# Patient Record
Sex: Female | Born: 1950 | Race: Black or African American | Hispanic: No | Marital: Single | Smoking: Current every day smoker
Health system: Southern US, Community
[De-identification: ages and names within clinical notes are randomized; demographics above are authoritative.]

## PROBLEM LIST (undated history)

## (undated) DIAGNOSIS — F32A Depression, unspecified: Secondary | ICD-10-CM

## (undated) DIAGNOSIS — R42 Dizziness and giddiness: Secondary | ICD-10-CM

## (undated) DIAGNOSIS — J45909 Unspecified asthma, uncomplicated: Secondary | ICD-10-CM

## (undated) DIAGNOSIS — I5189 Other ill-defined heart diseases: Secondary | ICD-10-CM

## (undated) DIAGNOSIS — I10 Essential (primary) hypertension: Secondary | ICD-10-CM

## (undated) HISTORY — DX: Dizziness and giddiness: R42

## (undated) HISTORY — DX: Depression, unspecified: F32.A

## (undated) HISTORY — DX: Unspecified asthma, uncomplicated: J45.909

---

## 2000-07-14 DIAGNOSIS — I10 Essential (primary) hypertension: Secondary | ICD-10-CM

## 2000-07-14 HISTORY — DX: Essential (primary) hypertension: I10

## 2010-07-14 DIAGNOSIS — M4800 Spinal stenosis, site unspecified: Secondary | ICD-10-CM

## 2010-07-14 HISTORY — DX: Spinal stenosis, site unspecified: M48.00

## 2011-07-15 HISTORY — PX: JOINT REPLACEMENT: SHX530

## 2017-07-14 HISTORY — PX: PACEMAKER PLACEMENT: SHX43

## 2020-03-23 ENCOUNTER — Emergency Department: Payer: Medicare (Managed Care)

## 2020-03-23 ENCOUNTER — Other Ambulatory Visit: Payer: Self-pay

## 2020-03-23 ENCOUNTER — Emergency Department
Admission: EM | Admit: 2020-03-23 | Discharge: 2020-03-23 | Disposition: A | Discharge status: Home / Self Care | Payer: Medicare (Managed Care) | Attending: Emergency Medicine | Admitting: Emergency Medicine

## 2020-03-23 DIAGNOSIS — M545 Low back pain: Secondary | ICD-10-CM | POA: Insufficient documentation

## 2020-03-23 DIAGNOSIS — I10 Essential (primary) hypertension: Secondary | ICD-10-CM | POA: Diagnosis not present

## 2020-03-23 DIAGNOSIS — M79604 Pain in right leg: Secondary | ICD-10-CM | POA: Diagnosis present

## 2020-03-23 HISTORY — DX: Essential (primary) hypertension: I10

## 2020-03-23 MED ORDER — NAPROXEN 500 MG PO TABS: 500.0000 mg | ORAL_TABLET | Freq: Two times a day (BID) | ORAL | 0 refills | Status: DC

## 2020-03-23 MED ORDER — TRAMADOL HCL 50 MG PO TABS: 50.0000 mg | ORAL_TABLET | Freq: Four times a day (QID) | ORAL | 0 refills | Status: DC | PRN

## 2020-03-23 MED ORDER — TRAMADOL HCL 50 MG PO TABS
50.0000 mg | ORAL_TABLET | Freq: Once | ORAL | Status: AC
  Administered 2020-03-23: 50 mg via ORAL
  Filled 2020-03-23: qty 1

## 2020-03-23 NOTE — ED Triage Notes (Signed)
Pt comes via POV from home with c/o pain to right leg. Pt states this started few weeks ago and has since gotten worse. Pt states she has arthritis.  Pt denies any recent injuries.

## 2020-03-23 NOTE — Discharge Instructions (Signed)
Please follow up with orthopedics for symptoms that are not improving over the next week.  Return to the ER for symptoms that change or worsen if unable to schedule an appointment.

## 2020-03-23 NOTE — ED Triage Notes (Addendum)
First nurse note- here for weakness, to right leg, feels like dragging it.  Sx X 2 weeks. Pt asking to go down to cafeteria, instructed to wait at this time as she has not even been triaged yet.

## 2020-03-23 NOTE — ED Provider Notes (Signed)
[facility] Emergency Department Provider Note ____________________________________________  Time seen: Approximately 10:22 AM  I have reviewed the triage vital signs and the nursing notes.   HISTORY  Chief Complaint Leg Pain    HPI Kari Hughes is a 69 y.o. female who presents to the emergency department for evaluation and treatment of for treatment and evaluation of nontraumatic right leg pain.  Patient states that symptoms started approximately 2 weeks ago.  She states that she has been able to ambulate but her right leg feels "different."  She states pain goes from her lower back into the hip and leg.  She has not fallen.  She moved to this area 3 days ago from [place].  No alleviating measures attempted prior to arrival.   Past Medical History:  Diagnosis Date  . Hypertension     There are no problems to display for this patient.   History reviewed. No pertinent surgical history.  Prior to Admission medications   Medication Sig Start Date End Date Taking? Authorizing Provider  naproxen (NAPROSYN) 500 MG tablet Take 1 tablet (500 mg total) by mouth 2 (two) times daily with a meal. 03/23/20   Sonam Huelsmann B, FNP  traMADol (ULTRAM) 50 MG tablet Take 1 tablet (50 mg total) by mouth every 6 (six) hours as needed. 03/23/20   [name], FNP    Allergies Patient has no allergy information on record.  No family history on file.  Social History Social History   Tobacco Use  . Smoking status: Not on file  Substance Use Topics  . Alcohol use: Not on file  . Drug use: Not on file    Review of Systems Constitutional: Negative for fever. Cardiovascular: Negative for chest pain. Respiratory: Negative for shortness of breath. Musculoskeletal: Positive for right leg pain and right low back pain. Skin: Negative for open wounds or lesions.  Neurological: Negative for decrease in  sensation  ____________________________________________   PHYSICAL EXAM:  VITAL SIGNS: ED Triage Vitals  Enc Vitals Group     BP 03/23/20 0938 (!) 168/100     Pulse Rate 03/23/20 0938 64     Resp 03/23/20 0938 17     Temp 03/23/20 0938 98 F (36.7 C)     Temp src --      SpO2 03/23/20 0938 100 %     Weight 03/23/20 0936 130 lb (59 kg)     Height 03/23/20 0936 5' (1.524 m)     Head Circumference --      Peak Flow --      Pain Score 03/23/20 0936 8     Pain Loc --      Pain Edu? --      Excl. in GC? --     Constitutional: Alert and oriented. Well appearing and in no acute distress. Eyes: Conjunctivae are clear without discharge or drainage Head: Atraumatic Neck: Supple  Respiratory: No cough. Respirations are even and unlabored. Musculoskeletal: Demonstrates FROM of right lower extremity. Gait is steady and coordinated without lag. Neurologic: Radicular pain from back to posterior knee.  Skin: No open wounds or lesions.   Psychiatric: Affect and behavior are appropriate.  ____________________________________________   LABS (all labs ordered are listed, but only abnormal results are displayed)  Labs Reviewed - No data to display ____________________________________________  RADIOLOGY  Images of the lumbar spine and femur hardened negative for acute findings.  There is degenerative disc disease and facet hypertrophy throughout the lumbar.  CT imaging of  the lower back shows severe right and moderately severe left foraminal narrowing at L5-S1 due to disc and right much worse than left facet degenerative disease.   DG Lumbar Spine 2-3 Views  Result Date: 03/23/2020 CLINICAL DATA:  Right leg weakness EXAM: LUMBAR SPINE - 2-3 VIEW COMPARISON:  None. FINDINGS: Grade 1 anterolisthesis at L5-S1. Vertebral body heights are maintained apart from mild degenerative endplate irregularity. There is diffuse disc space narrowing and vacuum disc phenomenon. Small posterior endplate  osteophytes are present. There is multilevel facet hypertrophy. IMPRESSION: Degenerative disc disease and facet hypertrophy throughout. Electronically Signed   By: Guadlupe Spanish M.D.   On: 03/23/2020 11:12   CT Lumbar Spine Wo Contrast  Result Date: 03/23/2020 CLINICAL DATA:  Low back pain. Right leg weakness. No known injury. EXAM: CT LUMBAR SPINE WITHOUT CONTRAST TECHNIQUE: Multidetector CT imaging of the lumbar spine was performed without intravenous contrast administration. Multiplanar CT image reconstructions were also generated. COMPARISON:  Plain films lumbar spine today. FINDINGS: Segmentation: Standard. Alignment: Facet degenerative disease results in 0.5 cm anterolisthesis L5 on S1. Vertebrae: No acute fracture or focal pathologic process. Degenerative endplate sclerosis is most notable at L3-4. Paraspinal and other soft tissues: Aortic atherosclerosis and sigmoid diverticulosis are seen. Disc levels: T11-12: Mild-to-moderate facet arthropathy and a minimal disc bulge. No stenosis. T12-L1: Shallow disc bulge and mild facet arthropathy.  No stenosis. L1-2: Vacuum disc phenomenon, mild loss of disc space height and a bulge eccentric to the left. The central canal and foramina are open. L2-3: Shallow disc bulge and vacuum disc phenomenon. Mild central canal and bilateral foraminal narrowing. L3-4: Vacuum disc phenomenon, disc bulge and ligamentum flavum thickening. Mild facet degenerative disease. There is mild to moderate central canal stenosis. Moderate to moderately severe foraminal narrowing is worse on the left. L4-5: Moderate facet arthropathy. Disc bulge and vacuum disc phenomenon. The central canal is open. Moderate to moderately severe foraminal narrowing is worse on the left. L5-S1: Severe facet degenerative change on the right and mild-to-moderate facet arthropathy on the left. The disc is uncovered with a shallow bulge. The central canal appears open. Disc and facet arthropathy cause severe  right and moderately severe left foraminal narrowing. IMPRESSION: No acute abnormality. Mild to moderate central canal stenosis at L3-4 where moderate to moderately severe bilateral foraminal narrowing is worse on the left. Moderate to moderately severe bilateral foraminal narrowing at L4-5 is worse on the left. The central canal is open. Severe right and moderately severe left foraminal narrowing at L5-S1 due to disc and right much worse than left facet degenerative disease. Diverticulosis. Aortic Atherosclerosis (ICD10-I70.0). Electronically Signed   By: Drusilla Kanner M.D.   On: 03/23/2020 12:15   DG Femur Min 2 Views Right  Result Date: 03/23/2020 CLINICAL DATA:  69 year old female with right leg weakness x2 weeks. No known injury. EXAM: RIGHT FEMUR 2 VIEWS COMPARISON:  None. FINDINGS: Bone mineralization is within normal limits. Right femoral head normally located. Visible right hemipelvis intact. Right SI joint appears normal. There is no evidence of fracture or other focal bone lesions. No acute osseous abnormality identified. There is some tricompartmental degenerative joint space loss at the right knee. There is some right femoral artery calcified peripheral vascular disease. IMPRESSION: No acute osseous abnormality identified. Right femoral artery calcified atherosclerosis. Electronically Signed   By: Odessa Fleming M.D.   On: 03/23/2020 11:11   ____________________________________________   PROCEDURES  Procedures  ____________________________________________   INITIAL IMPRESSION / ASSESSMENT AND PLAN / ED COURSE  Kari Hughes is a 69 y.o. who presents to the emergency department for treatment and evaluation of leg pain. See HPI.  Plan will be to get images and give medication.  X-rays show no concern for acute findings.  CT of the lumbar spine will be completed as the patient does tell me she has pacemaker therefore MRI cannot be performed.  While here, she was given Tramadol with  some relief. She will be prescribed tramadol and naprosyn and advised to follow up with orthopedics. She was also advised to establish care with a primary care provider.   Patient observed ambulating out while using her Rollator with a steady gait.  Medications  traMADol (ULTRAM) tablet 50 mg (50 mg Oral Given 03/23/20 1111)    Pertinent labs & imaging results that were available during my care of the patient were reviewed by me and considered in my medical decision making (see chart for details).   _________________________________________   FINAL CLINICAL IMPRESSION(S) / ED DIAGNOSES  Final diagnoses:  Leg pain, diffuse, right    ED Discharge Orders         Ordered    traMADol (ULTRAM) 50 MG tablet  Every 6 hours PRN        03/23/20 1305    naproxen (NAPROSYN) 500 MG tablet  2 times daily with meals        03/23/20 1305           If controlled substance prescribed during this visit, 12 month history viewed on the [facility] prior to issuing an initial prescription for Schedule II or III opiod.   [name], FNP 03/23/20 1326    Gilles Chiquito, MD 03/23/20 1335

## 2020-03-27 ENCOUNTER — Emergency Department
Admission: EM | Admit: 2020-03-27 | Discharge: 2020-03-27 | Disposition: A | Discharge status: Home / Self Care | Payer: Medicare (Managed Care) | Attending: Emergency Medicine | Admitting: Emergency Medicine

## 2020-03-27 ENCOUNTER — Other Ambulatory Visit: Payer: Self-pay

## 2020-03-27 DIAGNOSIS — M79606 Pain in leg, unspecified: Secondary | ICD-10-CM | POA: Diagnosis present

## 2020-03-27 DIAGNOSIS — Z950 Presence of cardiac pacemaker: Secondary | ICD-10-CM | POA: Insufficient documentation

## 2020-03-27 DIAGNOSIS — M479 Spondylosis, unspecified: Secondary | ICD-10-CM | POA: Insufficient documentation

## 2020-03-27 DIAGNOSIS — I10 Essential (primary) hypertension: Secondary | ICD-10-CM | POA: Diagnosis not present

## 2020-03-27 DIAGNOSIS — M158 Other polyosteoarthritis: Secondary | ICD-10-CM

## 2020-03-27 DIAGNOSIS — Z96651 Presence of right artificial knee joint: Secondary | ICD-10-CM | POA: Insufficient documentation

## 2020-03-27 MED ORDER — LIDOCAINE 5 % EX PTCH: 1.0000 | MEDICATED_PATCH | Freq: Two times a day (BID) | CUTANEOUS | 0 refills | Status: DC

## 2020-03-27 MED ORDER — LIDOCAINE 5 % EX PTCH
1.0000 | MEDICATED_PATCH | CUTANEOUS | Status: DC
  Administered 2020-03-27: 1 via TRANSDERMAL
  Filled 2020-03-27: qty 1

## 2020-03-27 NOTE — Discharge Instructions (Addendum)
Patient advised to follow-up with orthopedics listed on your discharge care instruction.  Continue previous medication and apply Lidoderm patches as directed.

## 2020-03-27 NOTE — ED Provider Notes (Signed)
[facility] Provider Note   ____________________________________________   First MD Initiated Contact with Patient 03/27/20 1239     (approximate)  I have reviewed the triage vital signs and the nursing notes.   HISTORY  Chief Complaint Leg Pain    HPI Kari Hughes is a 69 y.o. female patient presents with continue back and right leg pain.  Patient seen facility 2 days ago had extensive work-up to consist x-rays and CT scans.  Patient was advised to follow orthopedic for the definitive evaluation and treatment.  Patient states she has not contacted orthopedic department.  Patient states no relief with tramadol and naproxen.         Past Medical History:  Diagnosis Date  . Hypertension     There are no problems to display for this patient.   Past Surgical History:  Procedure Laterality Date  . JOINT REPLACEMENT Left 2013  . PACEMAKER PLACEMENT  2019    Prior to Admission medications   Medication Sig Start Date End Date Taking? Authorizing Provider  lidocaine (LIDODERM) 5 % Place 1 patch onto the skin every 12 (twelve) hours. Remove & Discard patch within 12 hours or as directed by MD 03/27/20 03/27/21  Joni Reining, PA-C  naproxen (NAPROSYN) 500 MG tablet Take 1 tablet (500 mg total) by mouth 2 (two) times daily with a meal. 03/23/20   Triplett, Cari B, FNP  traMADol (ULTRAM) 50 MG tablet Take 1 tablet (50 mg total) by mouth every 6 (six) hours as needed. 03/23/20   [name], FNP    Allergies Patient has no allergy information on record.  History reviewed. No pertinent family history.  Social History Social History   Tobacco Use  . Smoking status: Never Smoker  . Smokeless tobacco: Never Used  Substance Use Topics  . Alcohol use: Not Currently  . Drug use: Not Currently    Review of Systems Constitutional: No fever/chills Eyes: No visual changes. ENT: No sore throat. Cardiovascular: Denies chest  pain. Respiratory: Denies shortness of breath. Gastrointestinal: No abdominal pain.  No nausea, no vomiting.  No diarrhea.  No constipation. Genitourinary: Negative for dysuria. Musculoskeletal: Low back and right hip pain. Skin: Negative for rash. Neurological: Negative for headaches, focal weakness or numbness. Endocrine:  Hypertension  ____________________________________________   PHYSICAL EXAM:  VITAL SIGNS: ED Triage Vitals  Enc Vitals Group     BP 03/27/20 1212 (!) 131/102     Pulse Rate 03/27/20 1212 75     Resp 03/27/20 1212 16     Temp 03/27/20 1212 98.2 F (36.8 C)     Temp src --      SpO2 03/27/20 1212 95 %     Weight 03/27/20 1213 130 lb 1.1 oz (59 kg)     Height 03/27/20 1213 5\' 2"  (1.575 m)     Head Circumference --      Peak Flow --      Pain Score 03/27/20 1213 10     Pain Loc --      Pain Edu? --      Excl. in GC? --    Constitutional: Alert and oriented. Well appearing and in no acute distress.  No cervical lymphadenopathy. Cardiovascular: Normal rate, regular rhythm. Grossly normal heart sounds.  Good peripheral circulation. Respiratory: Normal respiratory effort.  No retractions. Lungs CTAB. Genitourinary: Deferred Musculoskeletal: No obvious lumbar spine deformity.  Patient has decreased range of motion for flexion.  No obvious deformity to  the right lower extremity.  Patient refused to ambulate without support of walker. Neurologic:  Normal speech and language. No gross focal neurologic deficits are appreciated. No gait instability. Skin:  Skin is warm, dry and intact. No rash noted. Psychiatric: Mood and affect are normal. Speech and behavior are normal.  ____________________________________________   LABS (all labs ordered are listed, but only abnormal results are displayed)  Labs Reviewed - No data to display ____________________________________________  EKG   ____________________________________________  RADIOLOGY  ED MD  interpretation: Reviewed images taken 2 days ago.  Official radiology report(s): No results found.  ____________________________________________   PROCEDURES  Procedure(s) performed (including Critical Care):  Procedures   ____________________________________________   INITIAL IMPRESSION / ASSESSMENT AND PLAN / ED COURSE  As part of my medical decision making, I reviewed the following data within the electronic MEDICAL RECORD NUMBER     Patient presents for continued back and right lower extremity pain.  Discussed rationale for consultant of orthopedic for definitive evaluation and treatment.  Advised to continue previous medication and given a prescription for Lidoderm patches.          ____________________________________________   FINAL CLINICAL IMPRESSION(S) / ED DIAGNOSES  Final diagnoses:  Other osteoarthritis involving multiple joints     ED Discharge Orders         Ordered    lidocaine (LIDODERM) 5 %  Every 12 hours        03/27/20 1252          *Please note:  Kari Hughes was evaluated in Emergency Department on 03/27/2020 for the symptoms described in the history of present illness. She was evaluated in the context of the global COVID-19 pandemic, which necessitated consideration that the patient might be at risk for infection with the SARS-CoV-2 virus that causes COVID-19. Institutional protocols and algorithms that pertain to the evaluation of patients at risk for COVID-19 are in a state of rapid change based on information released by regulatory bodies including the CDC and federal and state organizations. These policies and algorithms were followed during the patient's care in the ED.  Some ED evaluations and interventions may be delayed as a result of limited staffing during and the pandemic.*   Note:  This document was prepared using Dragon voice recognition software and may include unintentional dictation errors.    Joni Reining, PA-C 03/27/20  1301    Merwyn Katos, MD 03/27/20 [phone]

## 2020-03-27 NOTE — ED Triage Notes (Signed)
Pt arrives via pov with c.o right leg pain. Pt reports being seen here 2 days ago for same and was told it was degenerative disks. Pt states pain is same as before. NAD noted.

## 2020-03-30 DIAGNOSIS — M5416 Radiculopathy, lumbar region: Secondary | ICD-10-CM | POA: Insufficient documentation

## 2020-05-03 ENCOUNTER — Encounter: Payer: Self-pay | Admitting: Physician Assistant

## 2020-05-03 ENCOUNTER — Emergency Department
Admission: EM | Admit: 2020-05-03 | Discharge: 2020-05-03 | Disposition: A | Discharge status: Home / Self Care | Payer: Medicare Other | Attending: Emergency Medicine | Admitting: Emergency Medicine

## 2020-05-03 ENCOUNTER — Other Ambulatory Visit: Payer: Self-pay

## 2020-05-03 DIAGNOSIS — M5442 Lumbago with sciatica, left side: Secondary | ICD-10-CM | POA: Insufficient documentation

## 2020-05-03 DIAGNOSIS — I10 Essential (primary) hypertension: Secondary | ICD-10-CM | POA: Diagnosis not present

## 2020-05-03 DIAGNOSIS — Z9660 Presence of unspecified orthopedic joint implant: Secondary | ICD-10-CM | POA: Insufficient documentation

## 2020-05-03 DIAGNOSIS — Z950 Presence of cardiac pacemaker: Secondary | ICD-10-CM | POA: Diagnosis not present

## 2020-05-03 DIAGNOSIS — G8929 Other chronic pain: Secondary | ICD-10-CM

## 2020-05-03 DIAGNOSIS — M545 Low back pain, unspecified: Secondary | ICD-10-CM | POA: Diagnosis present

## 2020-05-03 DIAGNOSIS — M5441 Lumbago with sciatica, right side: Secondary | ICD-10-CM | POA: Insufficient documentation

## 2020-05-03 DIAGNOSIS — M542 Cervicalgia: Secondary | ICD-10-CM | POA: Diagnosis not present

## 2020-05-03 MED ORDER — HYDROMORPHONE HCL 1 MG/ML IJ SOLN
1.0000 mg | Freq: Once | INTRAMUSCULAR | Status: AC
  Administered 2020-05-03: 1 mg via INTRAMUSCULAR
  Filled 2020-05-03: qty 1

## 2020-05-03 MED ORDER — HYDROCODONE-ACETAMINOPHEN 5-325 MG PO TABS
1.0000 | ORAL_TABLET | Freq: Three times a day (TID) | ORAL | 0 refills | Status: DC | PRN
Start: 2020-05-03 — End: 2020-07-01

## 2020-05-03 MED ORDER — PREDNISONE 20 MG PO TABS: 20.0000 mg | ORAL_TABLET | Freq: Two times a day (BID) | ORAL | 0 refills | Status: DC

## 2020-05-03 MED ORDER — BACLOFEN 10 MG PO TABS: 10.0000 mg | ORAL_TABLET | Freq: Three times a day (TID) | ORAL | 1 refills | Status: DC | PRN

## 2020-05-03 MED ORDER — PREDNISONE 20 MG PO TABS
60.0000 mg | ORAL_TABLET | Freq: Once | ORAL | Status: AC
  Administered 2020-05-03: 60 mg via ORAL
  Filled 2020-05-03: qty 3

## 2020-05-03 NOTE — Discharge Instructions (Addendum)
You are being treated for your acute on chronic low back pain secondary to underlying DDD, facet arthritis, and radicular pain.  You should take the medications as prescribed.  Follow-up with your primary provider for ongoing symptoms.  Return to the ED if needed.

## 2020-05-03 NOTE — ED Notes (Signed)
Patient states she can't get any rest due to the chronic pain. Patient ambulated to the exam room from the lobby with the Rollator. Patient had a steady, slow, halting gait. Patient is tearful.

## 2020-05-03 NOTE — ED Triage Notes (Signed)
Pt in with co chronic neck, and back pain that radiates to right leg. Pt states she has been going to ortho but ran out of pain meds. Pt here for uncontrolled pain, did see ortho today but was not given a refill for vidocin.

## 2020-05-03 NOTE — ED Provider Notes (Signed)
[facility] Emergency Department Provider Note ____________________________________________  Time seen: 2154  I have reviewed the triage vital signs and the nursing notes.  HISTORY  Chief Complaint  Back Pain   HPI Kari Hughes is a 69 y.o. female presents to the ED for evaluation of acute on chronic low back pain.   With a history of chronic neck and back pain reports pain that radiates into the right leg.  She has been evaluated and followed by Ortho for her symptoms, but reports that she apparently ran out of her pain medications.  She is here for unknown control pain related to her underlying DDD and facet arthropathy.  She apparently had a visit with Ortho today, but was not given a refill on her Vicodin pain medicine.  Patient denies any recent injury, fall, trauma.  Also denies any bladder or bowel incontinence, foot drop, or saddle anesthesia.  Past Medical History:  Diagnosis Date  . Hypertension     There are no problems to display for this patient.   Past Surgical History:  Procedure Laterality Date  . JOINT REPLACEMENT Left 2013  . PACEMAKER PLACEMENT  2019    Prior to Admission medications   Medication Sig Start Date End Date Taking? Authorizing Provider  baclofen (LIORESAL) 10 MG tablet Take 1 tablet (10 mg total) by mouth 3 (three) times daily as needed for muscle spasms. 05/03/20 06/02/20  Devarious Pavek, Charlesetta Ivory, PA-C  HYDROcodone-acetaminophen (NORCO) 5-325 MG tablet Take 1 tablet by mouth 3 (three) times daily as needed. 05/03/20   Krimson Massmann, Charlesetta Ivory, PA-C  predniSONE (DELTASONE) 20 MG tablet Take 1 tablet (20 mg total) by mouth 2 (two) times daily with a meal for 5 days. 05/03/20 05/08/20  Windle Huebert, Charlesetta Ivory, PA-C    Allergies Patient has no allergy information on record.  History reviewed. No pertinent family history.  Social History Social History   Tobacco Use  . Smoking status: Never Smoker  . Smokeless tobacco:  Never Used  Substance Use Topics  . Alcohol use: Not Currently  . Drug use: Not Currently    Review of Systems  Constitutional: Negative for fever. Eyes: Negative for visual changes. ENT: Negative for sore throat. Cardiovascular: Negative for chest pain. Respiratory: Negative for shortness of breath. Gastrointestinal: Negative for abdominal pain, vomiting and diarrhea. Genitourinary: Negative for dysuria. Musculoskeletal: Positive for back pain. Skin: Negative for rash. Neurological: Negative for headaches, focal weakness or numbness. ____________________________________________  PHYSICAL EXAM:  VITAL SIGNS: ED Triage Vitals  Enc Vitals Group     BP 05/03/20 2104 (!) 187/106     Pulse Rate 05/03/20 2104 83     Resp 05/03/20 2104 18     Temp 05/03/20 2104 98.5 F (36.9 C)     Temp Source 05/03/20 2104 Oral     SpO2 05/03/20 2104 100 %     Weight 05/03/20 2105 135 lb (61.2 kg)     Height 05/03/20 2105 5\' 2"  (1.575 m)     Head Circumference --      Peak Flow --      Pain Score 05/03/20 2104 10     Pain Loc --      Pain Edu? --      Excl. in GC? --     Constitutional: Alert and oriented. Well appearing and in no distress. Head: Normocephalic and atraumatic. Eyes: Conjunctivae are normal. Normal extraocular movements Cardiovascular: Normal rate, regular rhythm. Normal distal pulses. Respiratory: Normal respiratory effort. No  wheezes/rales/rhonchi. Gastrointestinal: Soft and nontender. No distention. Musculoskeletal: Normal spinal alignment without midline tenderness, spasm, vomiting, or step-off.  Nontender with normal range of motion in all extremities.  Neurologic: Mildly antalgic gait assisted by a rolling walker.  Without ataxia. Normal speech and language. No gross focal neurologic deficits are appreciated. Skin:  Skin is warm, dry and intact. No rash noted. Psychiatric: Mood and affect are normal. Patient exhibits appropriate insight and  judgment. ____________________________________________  PROCEDURES  Dilaudid 1 mg IM Prednisone 60 mg PO  Procedures ____________________________________________  INITIAL IMPRESSION / ASSESSMENT AND PLAN / ED COURSE  Patient with a history of chronic back pain secondary to facet arthropathy, degenerative disc disease, and radiculopathy, presents for acute on chronic pain.  She is in the process of being transition to pain management and cleared by cardiology for injections and subsequent surgery.  She denies any interim injury.  Exam is overall benign reassuring at this time.  No red flags on exam.  She will be discharged with prescriptions for prednisone, hydrocodone, and baclofen.  She will follow-up with her Ortho provider for ongoing symptoms or return to the ED if needed.  Kari Hughes was evaluated in Emergency Department on 05/03/2020 for the symptoms described in the history of present illness. She was evaluated in the context of the global COVID-19 pandemic, which necessitated consideration that the patient might be at risk for infection with the SARS-CoV-2 virus that causes COVID-19. Institutional protocols and algorithms that pertain to the evaluation of patients at risk for COVID-19 are in a state of rapid change based on information released by regulatory bodies including the CDC and federal and state organizations. These policies and algorithms were followed during the patient's care in the ED.  I reviewed the patient's prescription history over the last 12 months in the multi-state controlled substances database(s) that includes [place], [place], [place], [place], [place], [place], [place], [place], [place], [place], [place], [place], [place], and [place].  Results were notable for no current RX. ____________________________________________  FINAL CLINICAL IMPRESSION(S) / ED DIAGNOSES  Final diagnoses:  Chronic bilateral low back pain  with bilateral sciatica      Karmen Stabs, Charlesetta Ivory, PA-C 05/03/20 2227    Gilles Chiquito, MD 05/04/20 [phone]

## 2020-05-03 NOTE — ED Notes (Signed)
Patient is calling for a ride

## 2020-05-08 ENCOUNTER — Other Ambulatory Visit: Payer: Self-pay

## 2020-05-08 ENCOUNTER — Ambulatory Visit (INDEPENDENT_AMBULATORY_CARE_PROVIDER_SITE_OTHER): Payer: Medicare Other | Admitting: Adult Health

## 2020-05-08 ENCOUNTER — Encounter: Payer: Self-pay | Admitting: Adult Health

## 2020-05-08 VITALS — BP 157/89 | HR 70 | Temp 97.9°F | Resp 16 | Wt 142.8 lb

## 2020-05-08 DIAGNOSIS — Z6826 Body mass index (BMI) 26.0-26.9, adult: Secondary | ICD-10-CM

## 2020-05-08 DIAGNOSIS — M2550 Pain in unspecified joint: Secondary | ICD-10-CM | POA: Diagnosis not present

## 2020-05-08 DIAGNOSIS — Z950 Presence of cardiac pacemaker: Secondary | ICD-10-CM | POA: Diagnosis not present

## 2020-05-08 DIAGNOSIS — H539 Unspecified visual disturbance: Secondary | ICD-10-CM

## 2020-05-08 DIAGNOSIS — G8929 Other chronic pain: Secondary | ICD-10-CM

## 2020-05-08 DIAGNOSIS — M5442 Lumbago with sciatica, left side: Secondary | ICD-10-CM | POA: Diagnosis not present

## 2020-05-08 DIAGNOSIS — I10 Essential (primary) hypertension: Secondary | ICD-10-CM | POA: Diagnosis not present

## 2020-05-08 DIAGNOSIS — M5441 Lumbago with sciatica, right side: Secondary | ICD-10-CM

## 2020-05-08 MED ORDER — AMLODIPINE BESYLATE 5 MG PO TABS: 5.0000 mg | ORAL_TABLET | Freq: Every day | ORAL | 0 refills | Status: DC

## 2020-05-08 NOTE — Progress Notes (Deleted)
New patient visit   Patient: Kari Hughes   DOB: Dec 15, 1950   69 y.o. Female  MRN: [id number] Visit Date: 05/08/2020  Today's healthcare provider: Jairo Ben, FNP   Chief Complaint  Patient presents with   New Patient (Initial Visit)   Subjective    Kari Hughes is a 69 y.o. female who presents today as a new patient to establish care.  HPI  Patient states that she feels poorly today and would like to address chronic joint pain. Patient states that she has move to [facility] from [place] and was looking to establish. Patient is following a balanced diet, she is not actively exercing and states that sleep is fairly poor.   Past Medical History:  Diagnosis Date   Hypertension    Past Surgical History:  Procedure Laterality Date   JOINT REPLACEMENT Left 2013   PACEMAKER PLACEMENT  2019   No family status information on file.   No family history on file. Social History   Socioeconomic History   Marital status: Single    Spouse name: Not on file   Number of children: Not on file   Years of education: Not on file   Highest education level: Not on file  Occupational History   Not on file  Tobacco Use   Smoking status: Never Smoker   Smokeless tobacco: Never Used  Substance and Sexual Activity   Alcohol use: Not Currently   Drug use: Not Currently   Sexual activity: Not on file  Other Topics Concern   Not on file  Social History Narrative   Not on file   Social Determinants of Health   Financial Resource Strain:    Difficulty of Paying Living Expenses: Not on file  Food Insecurity:    Worried About Running Out of Food in the Last Year: Not on file   Ran Out of Food in the Last Year: Not on file  Transportation Needs:    Lack of Transportation (Medical): Not on file   Lack of Transportation (Non-Medical): Not on file  Physical Activity:    Days of Exercise per Week: Not on file   Minutes of Exercise per Session: Not on  file  Stress:    Feeling of Stress : Not on file  Social Connections:    Frequency of Communication with Friends and Family: Not on file   Frequency of Social Gatherings with Friends and Family: Not on file   Attends Religious Services: Not on file   Active Member of Clubs or Organizations: Not on file   Attends Banker Meetings: Not on file   Marital Status: Not on file   Outpatient Medications Prior to Visit  Medication Sig   baclofen (LIORESAL) 10 MG tablet Take 1 tablet (10 mg total) by mouth 3 (three) times daily as needed for muscle spasms.   HYDROcodone-acetaminophen (NORCO) 5-325 MG tablet Take 1 tablet by mouth 3 (three) times daily as needed.   predniSONE (DELTASONE) 20 MG tablet Take 1 tablet (20 mg total) by mouth 2 (two) times daily with a meal for 5 days.   No facility-administered medications prior to visit.   No Known Allergies   There is no immunization history on file for this patient.  Health Maintenance  Topic Date Due   Hepatitis C Screening  Never done   COVID-19 Vaccine (1) Never done   TETANUS/TDAP  Never done   MAMMOGRAM  Never done   COLONOSCOPY  Never done   DEXA  SCAN  Never done   PNA vac Low Risk Adult (1 of 2 - PCV13) Never done   INFLUENZA VACCINE  Never done    Patient Care Team: Patient, No Pcp Per as PCP - General (General Practice)  Review of Systems  Constitutional: Positive for appetite change, chills and diaphoresis.  Cardiovascular: Positive for leg swelling.  Musculoskeletal: Positive for arthralgias and myalgias.  All other systems reviewed and are negative.   {Heme   Chem   Endocrine   Serology   Results Review (optional):[id number]::" "}  Objective    There were no vitals taken for this visit. Physical Exam ***  Depression Screen No flowsheet data found. No results found for any visits on 05/08/20.  Assessment & Plan     ***  No follow-ups on file.     {provider  attestation***:1}   Jairo Ben, FNP  [facility] [phone] (phone) [phone] (fax)  [facility]

## 2020-05-08 NOTE — Patient Instructions (Addendum)
Amlodipine Oral Tablets What is this medicine? AMLODIPINE (am LOE di peen) is a calcium channel blocker. It relaxes your blood vessels and decreases the amount of work the heart has to do. It treats high blood pressure and/or prevents chest pain (also called angina). This medicine may be used for other purposes; ask your health care provider or pharmacist if you have questions. COMMON BRAND NAME(S): Norvasc What should I tell my health care provider before I take this medicine? They need to know if you have any of these conditions:  heart disease  liver disease  an unusual or allergic reaction to amlodipine, other drugs, foods, dyes, or preservatives  pregnant or trying to get pregnant  breast-feeding How should I use this medicine? Take this drug by mouth. Take it as directed on the prescription label at the same time every day. You can take it with or without food. If it upsets your stomach, take it with food. Keep taking it unless your health care provider tells you to stop. Talk to your health care provider about the use of this drug in children. While it may be prescribed for children as young as 6 for selected conditions, precautions do apply. Overdosage: If you think you have taken too much of this medicine contact a poison control center or emergency room at once. NOTE: This medicine is only for you. Do not share this medicine with others. What if I miss a dose? If you miss a dose, take it as soon as you can. If it is almost time for your next dose, take only that dose. Do not take double or extra doses. What may interact with this medicine? This medicine may interact with the following medications:  clarithromycin  cyclosporine  diltiazem  itraconazole  simvastatin  tacrolimus This list may not describe all possible interactions. Give your health care provider a list of all the medicines, herbs, non-prescription drugs, or dietary supplements you use. Also tell them if  you smoke, drink alcohol, or use illegal drugs. Some items may interact with your medicine. What should I watch for while using this medicine? Visit your health care provider for regular checks on your progress. Check your blood pressure as directed. Ask your health care provider what your blood pressure should be. Also, find out when you should contact him or her. Do not treat yourself for coughs, colds, or pain while you are using this drug without asking your health care provider for advice. Some drugs may increase your blood pressure. You may get drowsy or dizzy. Do not drive, use machinery, or do anything that needs mental alertness until you know how this drug affects you. Do not stand up or sit up quickly, especially if you are an older patient. This reduces the risk of dizzy or fainting spells. What side effects may I notice from receiving this medicine? Side effects that you should report to your doctor or health care provider as soon as possible:  allergic reactions (skin rash, itching or hives; swelling of the face, lips, or tongue)  heart attack (trouble breathing; pain or tightness in the chest, neck, back or arms; unusually weak or tired)  low blood pressure (dizziness; feeling faint or lightheaded, falls; unusually weak or tired) Side effects that usually do not require medical attention (report these to your doctor or health care provider if they continue or are bothersome):  facial flushing  nausea  palpitations  stomach pain  sudden weight gain  swelling of the ankles, feet, hands  This list may not describe all possible side effects. Call your doctor for medical advice about side effects. You may report side effects to FDA at 1-800-FDA-1088. Where should I keep my medicine? Keep out of the reach of children and pets. Store at room temperature between 59 and 86 degrees F (15 and 30 degrees C). Protect from light and moisture. Keep the container tightly closed. Throw away  any unused drug after the expiration date. NOTE: This sheet is a summary. It may not cover all possible information. If you have questions about this medicine, talk to your doctor, pharmacist, or health care provider.  2020 Elsevier/Gold Standard (2019-04-05 19:39:45) Health Maintenance, Female Adopting a healthy lifestyle and getting preventive care are important in promoting health and wellness. Ask your health care provider about:  The right schedule for you to have regular tests and exams.  Things you can do on your own to prevent diseases and keep yourself healthy. What should I know about diet, weight, and exercise? Eat a healthy diet   Eat a diet that includes plenty of vegetables, fruits, low-fat dairy products, and lean protein.  Do not eat a lot of foods that are high in solid fats, added sugars, or sodium. Maintain a healthy weight Body mass index (BMI) is used to identify weight problems. It estimates body fat based on height and weight. Your health care provider can help determine your BMI and help you achieve or maintain a healthy weight. Get regular exercise Get regular exercise. This is one of the most important things you can do for your health. Most adults should:  Exercise for at least 150 minutes each week. The exercise should increase your heart rate and make you sweat (moderate-intensity exercise).  Do strengthening exercises at least twice a week. This is in addition to the moderate-intensity exercise.  Spend less time sitting. Even light physical activity can be beneficial. Watch cholesterol and blood lipids Have your blood tested for lipids and cholesterol at 69 years of age, then have this test every 5 years. Have your cholesterol levels checked more often if:  Your lipid or cholesterol levels are high.  You are older than 69 years of age.  You are at high risk for heart disease. What should I know about cancer screening? Depending on your health history  and family history, you may need to have cancer screening at various ages. This may include screening for:  Breast cancer.  Cervical cancer.  Colorectal cancer.  Skin cancer.  Lung cancer. What should I know about heart disease, diabetes, and high blood pressure? Blood pressure and heart disease  High blood pressure causes heart disease and increases the risk of stroke. This is more likely to develop in people who have high blood pressure readings, are of African descent, or are overweight.  Have your blood pressure checked: ? Every 3-5 years if you are 40-35 years of age. ? Every year if you are 44 years old or older. Diabetes Have regular diabetes screenings. This checks your fasting blood sugar level. Have the screening done:  Once every three years after age 62 if you are at a normal weight and have a low risk for diabetes.  More often and at a younger age if you are overweight or have a high risk for diabetes. What should I know about preventing infection? Hepatitis B If you have a higher risk for hepatitis B, you should be screened for this virus. Talk with your health care provider to find  out if you are at risk for hepatitis B infection. Hepatitis C Testing is recommended for:  Everyone born from [birth year] through 1965.  Anyone with known risk factors for hepatitis C. Sexually transmitted infections (STIs)  Get screened for STIs, including gonorrhea and chlamydia, if: ? You are sexually active and are younger than 69 years of age. ? You are older than 69 years of age and your health care provider tells you that you are at risk for this type of infection. ? Your sexual activity has changed since you were last screened, and you are at increased risk for chlamydia or gonorrhea. Ask your health care provider if you are at risk.  Ask your health care provider about whether you are at high risk for HIV. Your health care provider may recommend a prescription medicine to help  prevent HIV infection. If you choose to take medicine to prevent HIV, you should first get tested for HIV. You should then be tested every 3 months for as long as you are taking the medicine. Pregnancy  If you are about to stop having your period (premenopausal) and you may become pregnant, seek counseling before you get pregnant.  Take 400 to 800 micrograms (mcg) of folic acid every day if you become pregnant.  Ask for birth control (contraception) if you want to prevent pregnancy. Osteoporosis and menopause Osteoporosis is a disease in which the bones lose minerals and strength with aging. This can result in bone fractures. If you are 36 years old or older, or if you are at risk for osteoporosis and fractures, ask your health care provider if you should:  Be screened for bone loss.  Take a calcium or vitamin D supplement to lower your risk of fractures.  Be given hormone replacement therapy (HRT) to treat symptoms of menopause. Follow these instructions at home: Lifestyle  Do not use any products that contain nicotine or tobacco, such as cigarettes, e-cigarettes, and chewing tobacco. If you need help quitting, ask your health care provider.  Do not use street drugs.  Do not share needles.  Ask your health care provider for help if you need support or information about quitting drugs. Alcohol use  Do not drink alcohol if: ? Your health care provider tells you not to drink. ? You are pregnant, may be pregnant, or are planning to become pregnant.  If you drink alcohol: ? Limit how much you use to 0-1 drink a day. ? Limit intake if you are breastfeeding.  Be aware of how much alcohol is in your drink. In the [place] one drink equals one 12 oz bottle of beer (355 mL), one 5 oz glass of wine (148 mL), or one 1 oz glass of hard liquor (44 mL). General instructions  Schedule regular health, dental, and eye exams.  Stay current with your vaccines.  Tell your health care provider  if: ? You often feel depressed. ? You have ever been abused or do not feel safe at home. Summary  Adopting a healthy lifestyle and getting preventive care are important in promoting health and wellness.  Follow your health care provider's instructions about healthy diet, exercising, and getting tested or screened for diseases.  Follow your health care provider's instructions on monitoring your cholesterol and blood pressure. This information is not intended to replace advice given to you by your health care provider. Make sure you discuss any questions you have with your health care provider. Document Revised: 06/23/2018 Document Reviewed: 06/23/2018 Elsevier Patient Education  2020 [employer]   Calorie Counting for Weight Loss Calories are units of energy. Your body needs a certain amount of calories from food to keep you going throughout the day. When you eat more calories than your body needs, your body stores the extra calories as fat. When you eat fewer calories than your body needs, your body burns fat to get the energy it needs. Calorie counting means keeping track of how many calories you eat and drink each day. Calorie counting can be helpful if you need to lose weight. If you make sure to eat fewer calories than your body needs, you should lose weight. Ask your health care provider what a healthy weight is for you. For calorie counting to work, you will need to eat the right number of calories in a day in order to lose a healthy amount of weight per week. A dietitian can help you determine how many calories you need in a day and will give you suggestions on how to reach your calorie goal.  A healthy amount of weight to lose per week is usually 1-2 lb (0.5-0.9 kg). This usually means that your daily calorie intake should be reduced by 500-750 calories.  Eating 1,200 - 1,500 calories per day can help most women lose weight.  Eating 1,500 - 1,800 calories per day can help most men  lose weight. What is my plan? My goal is to have __________ calories per day. If I have this many calories per day, I should lose around __________ pounds per week. What do I need to know about calorie counting? In order to meet your daily calorie goal, you will need to:  Find out how many calories are in each food you would like to eat. Try to do this before you eat.  Decide how much of the food you plan to eat.  Write down what you ate and how many calories it had. Doing this is called keeping a food log. To successfully lose weight, it is important to balance calorie counting with a healthy lifestyle that includes regular activity. Aim for 150 minutes of moderate exercise (such as walking) or 75 minutes of vigorous exercise (such as running) each week. Where do I find calorie information?  The number of calories in a food can be found on a Nutrition Facts label. If a food does not have a Nutrition Facts label, try to look up the calories online or ask your dietitian for help. Remember that calories are listed per serving. If you choose to have more than one serving of a food, you will have to multiply the calories per serving by the amount of servings you plan to eat. For example, the label on a package of bread might say that a serving size is 1 slice and that there are 90 calories in a serving. If you eat 1 slice, you will have eaten 90 calories. If you eat 2 slices, you will have eaten 180 calories. How do I keep a food log? Immediately after each meal, record the following information in your food log:  What you ate. Don't forget to include toppings, sauces, and other extras on the food.  How much you ate. This can be measured in cups, ounces, or number of items.  How many calories each food and drink had.  The total number of calories in the meal. Keep your food log near you, such as in a small notebook in your pocket, or use a mobile app or  website. Some programs will calculate  calories for you and show you how many calories you have left for the day to meet your goal. What are some calorie counting tips?   Use your calories on foods and drinks that will fill you up and not leave you hungry: ? Some examples of foods that fill you up are nuts and nut butters, vegetables, lean proteins, and high-fiber foods like whole grains. High-fiber foods are foods with more than 5 g fiber per serving. ? Drinks such as sodas, specialty coffee drinks, alcohol, and juices have a lot of calories, yet do not fill you up.  Eat nutritious foods and avoid empty calories. Empty calories are calories you get from foods or beverages that do not have many vitamins or protein, such as candy, sweets, and soda. It is better to have a nutritious high-calorie food (such as an avocado) than a food with few nutrients (such as a bag of chips).  Know how many calories are in the foods you eat most often. This will help you calculate calorie counts faster.  Pay attention to calories in drinks. Low-calorie drinks include water and unsweetened drinks.  Pay attention to nutrition labels for "low fat" or "fat free" foods. These foods sometimes have the same amount of calories or more calories than the full fat versions. They also often have added sugar, starch, or salt, to make up for flavor that was removed with the fat.  Find a way of tracking calories that works for you. Get creative. Try different apps or programs if writing down calories does not work for you. What are some portion control tips?  Know how many calories are in a serving. This will help you know how many servings of a certain food you can have.  Use a measuring cup to measure serving sizes. You could also try weighing out portions on a kitchen scale. With time, you will be able to estimate serving sizes for some foods.  Take some time to put servings of different foods on your favorite plates, bowls, and cups so you know what a serving  looks like.  Try not to eat straight from a bag or box. Doing this can lead to overeating. Put the amount you would like to eat in a cup or on a plate to make sure you are eating the right portion.  Use smaller plates, glasses, and bowls to prevent overeating.  Try not to multitask (for example, watch TV or use your computer) while eating. If it is time to eat, sit down at a table and enjoy your food. This will help you to know when you are full. It will also help you to be aware of what you are eating and how much you are eating. What are tips for following this plan? Reading food labels  Check the calorie count compared to the serving size. The serving size may be smaller than what you are used to eating.  Check the source of the calories. Make sure the food you are eating is high in vitamins and protein and low in saturated and trans fats. Shopping  Read nutrition labels while you shop. This will help you make healthy decisions before you decide to purchase your food.  Make a grocery list and stick to it. Cooking  Try to cook your favorite foods in a healthier way. For example, try baking instead of frying.  Use low-fat dairy products. Meal planning  Use more fruits and vegetables. Half of  your plate should be fruits and vegetables.  Include lean proteins like poultry and fish. How do I count calories when eating out?  Ask for smaller portion sizes.  Consider sharing an entree and sides instead of getting your own entree.  If you get your own entree, eat only half. Ask for a box at the beginning of your meal and put the rest of your entree in it so you are not tempted to eat it.  If calories are listed on the menu, choose the lower calorie options.  Choose dishes that include vegetables, fruits, whole grains, low-fat dairy products, and lean protein.  Choose items that are boiled, broiled, grilled, or steamed. Stay away from items that are buttered, battered, fried, or  served with cream sauce. Items labeled "crispy" are usually fried, unless stated otherwise.  Choose water, low-fat milk, unsweetened iced tea, or other drinks without added sugar. If you want an alcoholic beverage, choose a lower calorie option such as a glass of wine or light beer.  Ask for dressings, sauces, and syrups on the side. These are usually high in calories, so you should limit the amount you eat.  If you want a salad, choose a garden salad and ask for grilled meats. Avoid extra toppings like bacon, cheese, or fried items. Ask for the dressing on the side, or ask for olive oil and vinegar or lemon to use as dressing.  Estimate how many servings of a food you are given. For example, a serving of cooked rice is  cup or about the size of half a baseball. Knowing serving sizes will help you be aware of how much food you are eating at restaurants. The list below tells you how big or small some common portion sizes are based on everyday objects: ? 1 oz--4 stacked dice. ? 3 oz--1 deck of cards. ? 1 tsp--1 die. ? 1 Tbsp-- a ping-pong ball. ? 2 Tbsp--1 ping-pong ball. ?  cup-- baseball. ? 1 cup--1 baseball. Summary  Calorie counting means keeping track of how many calories you eat and drink each day. If you eat fewer calories than your body needs, you should lose weight.  A healthy amount of weight to lose per week is usually 1-2 lb (0.5-0.9 kg). This usually means reducing your daily calorie intake by 500-750 calories.  The number of calories in a food can be found on a Nutrition Facts label. If a food does not have a Nutrition Facts label, try to look up the calories online or ask your dietitian for help.  Use your calories on foods and drinks that will fill you up, and not on foods and drinks that will leave you hungry.  Use smaller plates, glasses, and bowls to prevent overeating. This information is not intended to replace advice given to you by your health care provider. Make  sure you discuss any questions you have with your health care provider. Document Revised: 03/19/2018 Document Reviewed: 05/30/2016 Elsevier Patient Education  2020 [employer]   Fat and Cholesterol Restricted Eating Plan Getting too much fat and cholesterol in your diet may cause health problems. Choosing the right foods helps keep your fat and cholesterol at normal levels. This can keep you from getting certain diseases. Your doctor may recommend an eating plan that includes:  Total fat: ______% or less of total calories a day.  Saturated fat: ______% or less of total calories a day.  Cholesterol: less than _________mg a day.  Fiber: ______g a day. What  are tips for following this plan? Meal planning  At meals, divide your plate into four equal parts: ? Fill one-half of your plate with vegetables and green salads. ? Fill one-fourth of your plate with whole grains. ? Fill one-fourth of your plate with low-fat (lean) protein foods.  Eat fish that is high in omega-3 fats at least two times a week. This includes mackerel, tuna, sardines, and salmon.  Eat foods that are high in fiber, such as whole grains, beans, apples, broccoli, carrots, peas, and barley. General tips   Work with your doctor to lose weight if you need to.  Avoid: ? Foods with added sugar. ? Fried foods. ? Foods with partially hydrogenated oils.  Limit alcohol intake to no more than 1 drink a day for nonpregnant women and 2 drinks a day for men. One drink equals 12 oz of beer, 5 oz of wine, or 1 oz of hard liquor. Reading food labels  Check food labels for: ? Trans fats. ? Partially hydrogenated oils. ? Saturated fat (g) in each serving. ? Cholesterol (mg) in each serving. ? Fiber (g) in each serving.  Choose foods with healthy fats, such as: ? Monounsaturated fats. ? Polyunsaturated fats. ? Omega-3 fats.  Choose grain products that have whole grains. Look for the word "whole" as the first word in  the ingredient list. Cooking  Cook foods using low-fat methods. These include baking, boiling, grilling, and broiling.  Eat more home-cooked foods. Eat at restaurants and buffets less often.  Avoid cooking using saturated fats, such as butter, cream, palm oil, palm kernel oil, and coconut oil. Recommended foods  Fruits  All fresh, canned (in natural juice), or frozen fruits. Vegetables  Fresh or frozen vegetables (raw, steamed, roasted, or grilled). Green salads. Grains  Whole grains, such as whole wheat or whole grain breads, crackers, cereals, and pasta. Unsweetened oatmeal, bulgur, barley, quinoa, or brown rice. Corn or whole wheat flour tortillas. Meats and other protein foods  Ground beef (85% or leaner), grass-fed beef, or beef trimmed of fat. Skinless chicken or [place]. Ground chicken or [place]. Pork trimmed of fat. All fish and seafood. Egg whites. Dried beans, peas, or lentils. Unsalted nuts or seeds. Unsalted canned beans. Nut butters without added sugar or oil. Dairy  Low-fat or nonfat dairy products, such as skim or 1% milk, 2% or reduced-fat cheeses, low-fat and fat-free ricotta or cottage cheese, or plain low-fat and nonfat yogurt. Fats and oils  Tub margarine without trans fats. Light or reduced-fat mayonnaise and salad dressings. Avocado. Olive, canola, sesame, or safflower oils. The items listed above may not be a complete list of foods and beverages you can eat. Contact a dietitian for more information. Foods to avoid Fruits  Canned fruit in heavy syrup. Fruit in cream or butter sauce. Fried fruit. Vegetables  Vegetables cooked in cheese, cream, or butter sauce. Fried vegetables. Grains  White bread. White pasta. White rice. Cornbread. Bagels, pastries, and croissants. Crackers and snack foods that contain trans fat and hydrogenated oils. Meats and other protein foods  Fatty cuts of meat. Ribs, chicken wings, bacon, sausage, bologna, salami, chitterlings,  fatback, hot dogs, bratwurst, and packaged lunch meats. Liver and organ meats. Whole eggs and egg yolks. Chicken and [place] with skin. Fried meat. Dairy  Whole or 2% milk, cream, half-and-half, and cream cheese. Whole milk cheeses. Whole-fat or sweetened yogurt. Full-fat cheeses. Nondairy creamers and whipped toppings. Processed cheese, cheese spreads, and cheese curds. Beverages  Alcohol. Sugar-sweetened drinks such  as sodas, lemonade, and fruit drinks. Fats and oils  Butter, stick margarine, lard, shortening, ghee, or bacon fat. Coconut, palm kernel, and palm oils. Sweets and desserts  Corn syrup, sugars, honey, and molasses. Candy. Jam and jelly. Syrup. Sweetened cereals. Cookies, pies, cakes, donuts, muffins, and ice cream. The items listed above may not be a complete list of foods and beverages you should avoid. Contact a dietitian for more information. Summary  Choosing the right foods helps keep your fat and cholesterol at normal levels. This can keep you from getting certain diseases.  At meals, fill one-half of your plate with vegetables and green salads.  Eat high-fiber foods, like whole grains, beans, apples, carrots, peas, and barley.  Limit added sugar, saturated fats, alcohol, and fried foods. This information is not intended to replace advice given to you by your health care provider. Make sure you discuss any questions you have with your health care provider. Document Revised: 03/03/2018 Document Reviewed: 03/17/2017 Elsevier Patient Education  2020 [employer].

## 2020-05-08 NOTE — Progress Notes (Addendum)
New Patient Office Visit  Subjective:  Patient ID: Kari Hughes, female    DOB: 07/25/1950  Age: 69 y.o. MRN: [id number]  CC:  Chief Complaint  Patient presents with  . New Patient (Initial Visit)    HPI Kari Hughes presents as a new patient to establish primary care. She reports she needs a cardiologist , she has not seen cardiology in a while and has a pacemaker. 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 Denies any symptoms since.   She moves here from [place].   She has chronic joint pain for years, all her joints for the past 10 years.   She is taking Amlodipine she reports she does not know how many milligrams, not on file, She is taking daily, however she did not take today.   She has cataracts bilaterally, she reports she was seen at [facility] this morning eye exam. She is not able to fill out her new patient forms.  She sees [facility] for [facility] pain, see CT, may need MRI in future.   Patient  denies any fever, body aches,chills, rash, chest pain, shortness of breath, nausea, vomiting, or diarrhea.  Denies dizziness, lightheadedness, pre syncopal or syncopal episodes.    Past Medical History:  Diagnosis Date  . Hypertension     Past Surgical History:  Procedure Laterality Date  . JOINT REPLACEMENT Left 2013  . PACEMAKER PLACEMENT  2019    History reviewed. No pertinent family history.  Social History   Socioeconomic History  . Marital status: Single    Spouse name: Not on file  . Number of children: Not on file  . Years of education: Not on file  . Highest education level: Not on file  Occupational History  . Not on file  Tobacco Use  . Smoking status: Never Smoker  . Smokeless tobacco: Never Used  Substance and Sexual Activity  . Alcohol use: Not Currently  . Drug use: Not Currently  . Sexual activity: Not on file  Other Topics Concern  . Not on file  Social History Narrative  . Not on file   Social  Determinants of Health   Financial Resource Strain:   . Difficulty of Paying Living Expenses: Not on file  Food Insecurity:   . Worried About Programme researcher, broadcasting/film/video in the Last Year: Not on file  . Ran Out of Food in the Last Year: Not on file  Transportation Needs:   . Lack of Transportation (Medical): Not on file  . Lack of Transportation (Non-Medical): Not on file  Physical Activity:   . Days of Exercise per Week: Not on file  . Minutes of Exercise per Session: Not on file  Stress:   . Feeling of Stress : Not on file  Social Connections:   . Frequency of Communication with Friends and Family: Not on file  . Frequency of Social Gatherings with Friends and Family: Not on file  . Attends Religious Services: Not on file  . Active Member of Clubs or Organizations: Not on file  . Attends Banker Meetings: Not on file  . Marital Status: Not on file  Intimate Partner Violence:   . Fear of Current or Ex-Partner: Not on file  . Emotionally Abused: Not on file  . Physically Abused: Not on file  . Sexually Abused: Not on file    ROS Review of Systems  Constitutional: Positive for fatigue. Negative for activity change, appetite change, chills, diaphoresis, fever and  unexpected weight change.  HENT: Negative.   Respiratory: Negative.   Cardiovascular: Negative.   Gastrointestinal: Negative.   Genitourinary: Negative.   Musculoskeletal: Positive for arthralgias and myalgias. Negative for back pain, gait problem, joint swelling, neck pain and neck stiffness.  Neurological: Negative.   Psychiatric/Behavioral: Negative.     Objective:   Today's Vitals: BP (!) 157/89   Pulse 70   Temp 97.9 F (36.6 C) (Oral)   Resp 16   Wt 142 lb 12.8 oz (64.8 kg)   BMI 26.12 kg/m   Physical Exam Vitals reviewed.  Constitutional:      General: She is not in acute distress.    Appearance: She is well-developed. She is not diaphoretic.     Interventions: She is not intubated.     Comments: Frail in appearence.   HENT:     Head: Normocephalic and atraumatic.     Right Ear: External ear normal.     Left Ear: External ear normal.     Nose: Nose normal.     Mouth/Throat:     Mouth: Mucous membranes are moist.     Pharynx: No oropharyngeal exudate or posterior oropharyngeal erythema.  Eyes:     General: Lids are normal. No allergic shiner, visual field deficit or scleral icterus.       Right eye: No discharge.        Left eye: No discharge.     Conjunctiva/sclera: Conjunctivae normal.     Right eye: Right conjunctiva is not injected. No chemosis, exudate or hemorrhage.    Left eye: Left conjunctiva is not injected. No chemosis, exudate or hemorrhage.    Pupils: Pupils are equal, round, and reactive to light.     Comments: Unable to see to read new patient forms reports lost her glasses.  Cataracts visible both eyes. She does not drive, she has sister drive her.   Neck:     Thyroid: No thyroid mass or thyromegaly.     Vascular: Normal carotid pulses. No carotid bruit, hepatojugular reflux or JVD.     Trachea: Trachea and phonation normal. No tracheal tenderness or tracheal deviation.     Meningeal: Brudzinski's sign and Kernig's sign absent.  Cardiovascular:     Rate and Rhythm: Normal rate and regular rhythm.     Pulses: Normal pulses.          Radial pulses are 2+ on the right side and 2+ on the left side.       Dorsalis pedis pulses are 2+ on the right side and 2+ on the left side.       Posterior tibial pulses are 2+ on the right side and 2+ on the left side.     Heart sounds: Normal heart sounds, S1 normal and S2 normal. Heart sounds not distant. No murmur heard.  No friction rub. No gallop.   Pulmonary:     Effort: Pulmonary effort is normal. No tachypnea, bradypnea, accessory muscle usage or respiratory distress. She is not intubated.     Breath sounds: Normal breath sounds. No stridor. No wheezing, rhonchi or rales.  Chest:     Chest wall: No tenderness.   Abdominal:     General: Bowel sounds are normal. There is no distension or abdominal bruit.     Palpations: Abdomen is soft. There is no shifting dullness, fluid wave, hepatomegaly, splenomegaly, mass or pulsatile mass.     Tenderness: There is no abdominal tenderness. There is no right CVA tenderness, left CVA tenderness,  guarding or rebound.     Hernia: No hernia is present.  Musculoskeletal:        General: No tenderness or deformity. Normal range of motion.     Cervical back: Full passive range of motion without pain, normal range of motion and neck supple. No edema, erythema or rigidity. No spinous process tenderness or muscular tenderness. Normal range of motion.  Lymphadenopathy:     Head:     Right side of head: No submental, submandibular, tonsillar, preauricular, posterior auricular or occipital adenopathy.     Left side of head: No submental, submandibular, tonsillar, preauricular, posterior auricular or occipital adenopathy.     Cervical: No cervical adenopathy.     Right cervical: No superficial, deep or posterior cervical adenopathy.    Left cervical: No superficial, deep or posterior cervical adenopathy.     Upper Body:     Right upper body: No supraclavicular or pectoral adenopathy.     Left upper body: No supraclavicular or pectoral adenopathy.  Skin:    General: Skin is warm and dry.     Capillary Refill: Capillary refill takes less than 2 seconds.     Coloration: Skin is not pale.     Findings: No abrasion, bruising, burn, ecchymosis, erythema, lesion, petechiae or rash.     Nails: There is no clubbing.  Neurological:     Mental Status: She is alert and oriented to person, place, and time.     GCS: GCS eye subscore is 4. GCS verbal subscore is 5. GCS motor subscore is 6.     Cranial Nerves: No cranial nerve deficit.     Sensory: No sensory deficit.     Motor: No tremor, atrophy, abnormal muscle tone or seizure activity.     Coordination: Coordination normal.      Gait: Gait normal.     Deep Tendon Reflexes: Reflexes are normal and symmetric. Reflexes normal. Babinski sign absent on the right side. Babinski sign absent on the left side.     Reflex Scores:      Tricep reflexes are 2+ on the right side and 2+ on the left side.      Bicep reflexes are 2+ on the right side and 2+ on the left side.      Brachioradialis reflexes are 2+ on the right side and 2+ on the left side.      Patellar reflexes are 2+ on the right side and 2+ on the left side.      Achilles reflexes are 2+ on the right side and 2+ on the left side. Psychiatric:        Mood and Affect: Mood normal.        Speech: Speech normal.        Behavior: Behavior normal.        Thought Content: Thought content normal.        Judgment: Judgment normal.     Assessment & Plan:   Problem List Items Addressed This Visit      Other   Pacemaker   Relevant Orders   Ambulatory referral to Cardiology    Other Visit Diagnoses    Chronic bilateral low back pain with bilateral sciatica    -  Primary   Relevant Orders   CBC w/Diff/Platelet (Completed)   Comprehensive Metabolic Panel (CMET) (Completed)   Ambulatory referral to Orthopedic Surgery   Chronic joint pain       Relevant Orders   Rheumatoid factor (Completed)   ANA (Completed)  Ambulatory referral to Orthopedic Surgery   Hypertension, unspecified type       Relevant Medications   amLODipine (NORVASC) 5 MG tablet   Other Relevant Orders   TSH (Completed)   Lipid Panel w/o Chol/HDL Ratio (Completed)   Change in vision       Relevant Orders   Ambulatory referral to Ophthalmology   Body mass index 26.0-26.9, adult          Outpatient Encounter Medications as of 05/08/2020  Medication Sig  . amLODipine (NORVASC) 5 MG tablet Take 1 tablet (5 mg total) by mouth daily.  . baclofen (LIORESAL) 10 MG tablet Take 1 tablet (10 mg total) by mouth 3 (three) times daily as needed for muscle spasms.  Marland Kitchen HYDROcodone-acetaminophen (NORCO)  5-325 MG tablet Take 1 tablet by mouth 3 (three) times daily as needed.  . [DISCONTINUED] predniSONE (DELTASONE) 20 MG tablet Take 1 tablet (20 mg total) by mouth 2 (two) times daily with a meal for 5 days.   No facility-administered encounter medications on file as of 05/08/2020.    Orders Placed This Encounter  Procedures  . CBC w/Diff/Platelet  . Comprehensive Metabolic Panel (CMET)  . TSH  . Lipid Panel w/o Chol/HDL Ratio  . Rheumatoid factor  . ANA  . Ambulatory referral to Cardiology  . Ambulatory referral to Orthopedic Surgery  . Ambulatory referral to Ophthalmology   Follow-up: There are other unrelated non-urgent complaints, but due to the busy schedule and the amount of time I've already spent with her, time does not permit me to address these routine issues at today's visit.   I've requested another appointment to review these additional issues. follow up for CPE, care gaps completion. Blood pressure recheck and she will bring medications with her.   Addressed acute and or chronic medical problems today requiring 50 minutes reviewing patients medical record,labs, counseling patient regarding patient's conditions, any medications, answering questions regarding health, and coordination of care as needed. After visit summary patient given copy and reviewed.   Return in about 1 week (around 05/15/2020), or if symptoms worsen or fail to improve, for at any time for any worsening symptoms, Go to Emergency room/ urgent care if worse.   Jairo Ben, FNP

## 2020-05-09 LAB — ANA: Anti Nuclear Antibody (ANA): NEGATIVE

## 2020-05-09 LAB — COMPREHENSIVE METABOLIC PANEL WITH GFR
ALT: 17 IU/L (ref 0–32)
AST: 14 IU/L (ref 0–40)
Albumin/Globulin Ratio: 1.9 (ref 1.2–2.2)
Albumin: 4.6 g/dL (ref 3.8–4.8)
Alkaline Phosphatase: 66 IU/L (ref 44–121)
BUN/Creatinine Ratio: 21 (ref 12–28)
BUN: 19 mg/dL (ref 8–27)
Bilirubin Total: 0.3 mg/dL (ref 0.0–1.2)
CO2: 21 mmol/L (ref 20–29)
Calcium: 9.7 mg/dL (ref 8.7–10.3)
Chloride: 107 mmol/L — ABNORMAL HIGH (ref 96–106)
Creatinine, Ser: 0.92 mg/dL (ref 0.57–1.00)
GFR calc Af Amer: 73 mL/min/1.73
GFR calc non Af Amer: 64 mL/min/1.73
Globulin, Total: 2.4 g/dL (ref 1.5–4.5)
Glucose: 83 mg/dL (ref 65–99)
Potassium: 4.3 mmol/L (ref 3.5–5.2)
Sodium: 143 mmol/L (ref 134–144)
Total Protein: 7 g/dL (ref 6.0–8.5)

## 2020-05-09 LAB — CBC WITH DIFFERENTIAL/PLATELET
Basophils Absolute: 0 10*3/uL (ref 0.0–0.2)
Basos: 0 %
EOS (ABSOLUTE): 0 10*3/uL (ref 0.0–0.4)
Eos: 0 %
Hematocrit: 40.2 % (ref 34.0–46.6)
Hemoglobin: 13.3 g/dL (ref 11.1–15.9)
Immature Grans (Abs): 0.1 10*3/uL (ref 0.0–0.1)
Immature Granulocytes: 1 %
Lymphocytes Absolute: 4.3 10*3/uL — ABNORMAL HIGH (ref 0.7–3.1)
Lymphs: 31 %
MCH: 29 pg (ref 26.6–33.0)
MCHC: 33.1 g/dL (ref 31.5–35.7)
MCV: 88 fL (ref 79–97)
Monocytes Absolute: 1.1 10*3/uL — ABNORMAL HIGH (ref 0.1–0.9)
Monocytes: 8 %
Neutrophils Absolute: 8.1 10*3/uL — ABNORMAL HIGH (ref 1.4–7.0)
Neutrophils: 60 %
Platelets: 355 10*3/uL (ref 150–450)
RBC: 4.59 x10E6/uL (ref 3.77–5.28)
RDW: 15.7 % — ABNORMAL HIGH (ref 11.7–15.4)
WBC: 13.6 10*3/uL — ABNORMAL HIGH (ref 3.4–10.8)

## 2020-05-09 LAB — LIPID PANEL W/O CHOL/HDL RATIO
Cholesterol, Total: 225 mg/dL — ABNORMAL HIGH (ref 100–199)
HDL: 86 mg/dL
LDL Chol Calc (NIH): 110 mg/dL — ABNORMAL HIGH (ref 0–99)
Triglycerides: 174 mg/dL — ABNORMAL HIGH (ref 0–149)
VLDL Cholesterol Cal: 29 mg/dL (ref 5–40)

## 2020-05-09 LAB — TSH: TSH: 2.92 u[IU]/mL (ref 0.450–4.500)

## 2020-05-09 LAB — RHEUMATOID FACTOR: Rheumatoid fact SerPl-aCnc: <10 IU/mL (ref 0.0–13.9)

## 2020-05-10 ENCOUNTER — Other Ambulatory Visit: Payer: Self-pay | Admitting: Adult Health

## 2020-05-10 DIAGNOSIS — E785 Hyperlipidemia, unspecified: Secondary | ICD-10-CM | POA: Insufficient documentation

## 2020-05-10 MED ORDER — ATORVASTATIN CALCIUM 10 MG PO TABS: 10.0000 mg | ORAL_TABLET | Freq: Every day | ORAL | 1 refills | Status: DC

## 2020-05-10 NOTE — Progress Notes (Unsigned)
Meds ordered this encounter  Medications  . atorvastatin (LIPITOR) 10 MG tablet    Sig: Take 1 tablet (10 mg total) by mouth daily.    Dispense:  90 tablet    Refill:  1   Orders Placed This Encounter  Procedures  . Comprehensive Metabolic Panel (CMET)    Standing Status:   Future    Standing Expiration Date:   05/10/2021  . Lipid Panel w/o Chol/HDL Ratio    Standing Status:   Future    Standing Expiration Date:   09/10/2020  recheck above in 6 weeks.

## 2020-05-10 NOTE — Progress Notes (Signed)
Verify if any symptoms of infection or urinary infection. White blood cell count elevated. CMP ok.  TSH for thyroid is elevated.  Sent in Liptor 10mg  by mouth once daiy for cholesterol elevation. Report any muscle pains, increased body aches or any unwanted side effects.  Recheck lipids and CMP in 6 weeks advised.   ANA and RA factor within normal limits.

## 2020-05-11 ENCOUNTER — Telehealth: Payer: Self-pay

## 2020-05-11 DIAGNOSIS — I10 Essential (primary) hypertension: Secondary | ICD-10-CM | POA: Insufficient documentation

## 2020-05-11 DIAGNOSIS — Z6826 Body mass index (BMI) 26.0-26.9, adult: Secondary | ICD-10-CM | POA: Insufficient documentation

## 2020-05-11 DIAGNOSIS — M2550 Pain in unspecified joint: Secondary | ICD-10-CM

## 2020-05-11 DIAGNOSIS — G8929 Other chronic pain: Secondary | ICD-10-CM | POA: Insufficient documentation

## 2020-05-11 DIAGNOSIS — M5442 Lumbago with sciatica, left side: Secondary | ICD-10-CM | POA: Insufficient documentation

## 2020-05-11 DIAGNOSIS — H539 Unspecified visual disturbance: Secondary | ICD-10-CM | POA: Insufficient documentation

## 2020-05-11 DIAGNOSIS — D72829 Elevated white blood cell count, unspecified: Secondary | ICD-10-CM

## 2020-05-11 NOTE — Telephone Encounter (Signed)
Advised pt of results. She denies any urinary infection, but does have slight congestion. She also mentions that she already has aching in her joints to the point she cant even walk. Will Lipitor make this worse? Advised that this has been sent into her pharmacy. Please advise. Thanks!

## 2020-05-11 NOTE — Telephone Encounter (Signed)
-----   Message from Berniece Pap, FNP sent at 05/10/2020  4:59 PM EDT ----- Verify if any symptoms of infection or urinary infection. White blood cell count elevated. CMP ok.  TSH for thyroid is elevated.  Sent in Liptor 10mg  by mouth once daiy for cholesterol elevation. Report any muscle pains, increased body aches or any unwanted side effects.  Recheck lipids and CMP in 6 weeks advised.   ANA and RA factor within normal limits.

## 2020-05-11 NOTE — Telephone Encounter (Signed)
Lipitor can worsen joint aches, has she ever taken ? If she prefers we can discontinue Lipitor and try Zetia 10 mg one tablet by mouth once daily  # 90 with 1 refill. If she prefers she can try aggressive lifestyle/ dietary changes and see nutritionist.   Have only had new patient appointment and She is seeing orthopedics, we may need to refer to  rheumatology  RA and ANA negative in labs  for further work up, if she is in agreement ok to place order.

## 2020-05-15 NOTE — Telephone Encounter (Signed)
Left message for patient to call back okay for [facility] triage nurse to advise patient of message below. KW

## 2020-05-16 ENCOUNTER — Other Ambulatory Visit: Payer: Self-pay | Admitting: Adult Health

## 2020-05-16 MED ORDER — EZETIMIBE 10 MG PO TABS: 10.0000 mg | ORAL_TABLET | Freq: Every day | ORAL | 1 refills | Status: DC

## 2020-05-16 NOTE — Telephone Encounter (Signed)
Discontinued Lipitor.  Recheck lipid panel in 4- 6 months advised.   CBC and CMP recheck advised in 1 month lab.   Thyroid level is within normal limits.

## 2020-05-16 NOTE — Addendum Note (Signed)
Addended by: Fonda Kinder on: 05/16/2020 02:49 PM   Modules accepted: Orders

## 2020-05-16 NOTE — Telephone Encounter (Signed)
Phone call to pt.  Spoke with pt's. Sister, Coralee North, after receiving verbal okay from the patient.  Advised of recommendation from Geneva Flinchum from 05/11/20.  Per patient, she has never taken Lipitor.  C/o intermittent "aching and burning" in her hands and legs.  Stated she prefers not to start Lipitor.  Per sister, the pt. Would like to try Zetia, and will work on making dietary changes.  Has been referred to Rheumatology, by the physician at [facility]. Ortho.  Has Rheumatology consultation scheduled on 05/31/20 @ [facility].    Sister stated that the patient wanted to review her Thyroid test.  Advised that the TSH was 2.920 that showed it was within the reference range.  Patient requested to check with Marvell Fuller regarding her concern that the TSH was elevated.  Advised will send message to office for review.  Sister verb. Understanding, on pt's. behalf.    Per written order of Marvell Fuller, will send Rx to pharmacy for Zetia 10 mg. By mouth once daily; #90; RF x 1. (to replace Lipitor)

## 2020-05-16 NOTE — Telephone Encounter (Signed)
Phone call to pt.  Spoke with sister, Coralee North.  Advised of recommendations per Marcelino Duster Flinchum to check CBC, CMP in one month, and to check Lipid panel in 4-6 mos.  Advised that pt. Should be fasting for future labs.  Also advised that TSH reviewed by Marvell Fuller, and noted to be within normal limits.  Sister verb. Understanding.  Agreed with plan.

## 2020-05-25 ENCOUNTER — Telehealth: Payer: Self-pay

## 2020-05-25 NOTE — Telephone Encounter (Signed)
Noted and agreee

## 2020-05-25 NOTE — Telephone Encounter (Signed)
Called and triaged patient on the phone who states for the past week she has had URI symptoms. Patient reports symptoms of runny nose, cough, sneezing, difficulty breathing and vertigo. Patient states that she has tried otc Theraflu and Alka-Seltzer Plus with no relief. Patient was wanting to know what she should do? I advise patient that a virtual appt would be needed to evaluate her. I scheduled telephone visit for Monday since there was no openings this evenings. I advised patient that if her symptoms get worse prior to visit to see immediate medical attention at urgent care or ED. KW

## 2020-05-25 NOTE — Telephone Encounter (Signed)
Copied from CRM [id number]. Topic: General - Inquiry >> May 25, 2020 11:57 AM Daphine Deutscher D wrote: Reason for CRM: Pt called saying she has a "bad cold"  and would like a nurse to call her back  CB#  [phone] or [phone]

## 2020-05-28 ENCOUNTER — Ambulatory Visit (INDEPENDENT_AMBULATORY_CARE_PROVIDER_SITE_OTHER): Payer: Medicare Other | Admitting: Adult Health

## 2020-05-28 ENCOUNTER — Other Ambulatory Visit: Payer: Self-pay | Admitting: Adult Health

## 2020-05-28 ENCOUNTER — Ambulatory Visit: Payer: Self-pay

## 2020-05-28 ENCOUNTER — Encounter: Payer: Self-pay | Admitting: Adult Health

## 2020-05-28 ENCOUNTER — Telehealth: Payer: Self-pay | Admitting: Adult Health

## 2020-05-28 DIAGNOSIS — Z5329 Procedure and treatment not carried out because of patient's decision for other reasons: Secondary | ICD-10-CM

## 2020-05-28 DIAGNOSIS — Z91199 Patient's noncompliance with other medical treatment and regimen due to unspecified reason: Secondary | ICD-10-CM | POA: Insufficient documentation

## 2020-05-28 NOTE — Telephone Encounter (Signed)
Medication: baclofen (LIORESAL) 10 MG tablet [[id number]] , amLODipine (NORVASC) 5 MG tablet [[id number]]   Has the patient contacted their pharmacy? YES (Agent: If no, request that the patient contact the pharmacy for the refill.) (Agent: If yes, when and what did the pharmacy advise?)  Preferred Pharmacy (with phone number or street name): [id number] [place], [place] - [address] [place] [address] Phone: [phone] Fax: [phone] Hours: Not open 24 hours    Agent: Please be advised that RX refills may take up to 3 business days. We ask that you follow-up with your pharmacy.

## 2020-05-28 NOTE — Telephone Encounter (Signed)
Addressed in TE addendum.

## 2020-05-28 NOTE — Telephone Encounter (Signed)
Pt calling with questions regarding medications; see earlier encounters. Advised generally pt's are seen every 6 months to eval BP and medications. Advised note was sent to practice as well. Pt states had virtual appt today? Questioning if meds could be called in for "Cold symptoms." See previous encounters.  CB# [phone]

## 2020-05-28 NOTE — Telephone Encounter (Signed)
Patient is calling to request if Marcelino Duster could prescribe something for a cold. Patient reports cough, mucus, sneezing, with phelm. Patient reports having cold for 2 weeks. And has been taking OTC medications. Patient was seen virtually today. Please advise [phone] [facility] [address] [place], [place]

## 2020-05-28 NOTE — Telephone Encounter (Signed)
Patient called, left VM to return the call to the office.    Patient is wanting to know how often she should be seen on high blood pressure medication. Please advise CB- [phone]

## 2020-05-28 NOTE — Progress Notes (Signed)
° ° °  Provider was unable to reach patient x 3 attempts for virtual visit.

## 2020-05-28 NOTE — Progress Notes (Signed)
   Kari Hughes, attempted to call patient x 2 without answer  X 2 for her scheduled virtual appointment. Provider called patient, she answered, then phone was disconnected. Unable to reach patient again by phone x 2 more attempts.

## 2020-05-29 ENCOUNTER — Telehealth: Payer: Self-pay

## 2020-05-29 NOTE — Telephone Encounter (Signed)
Patient needs a virtual she was not seen she did not answer the phone x 2 scheduled visit.

## 2020-05-29 NOTE — Telephone Encounter (Signed)
Opened in error

## 2020-05-31 NOTE — Telephone Encounter (Signed)
Looks like patient has an appointment for a 1 month follow up tomorrow 06/01/2020 with Marcelino Duster. Tried calling patient to screen for symptoms. Will need to change to virtual visit to address cold symptoms. Left message to call back. OK for PEC to advise and change to virtual visit.

## 2020-05-31 NOTE — Telephone Encounter (Signed)
Patient called and advised of the need to change to a virtual visit tomorrow per Michelle's note below, she verbalized understanding.

## 2020-06-01 ENCOUNTER — Telehealth (INDEPENDENT_AMBULATORY_CARE_PROVIDER_SITE_OTHER): Payer: Medicare Other | Admitting: Adult Health

## 2020-06-01 ENCOUNTER — Encounter: Payer: Self-pay | Admitting: Adult Health

## 2020-06-01 ENCOUNTER — Ambulatory Visit: Payer: Medicare Other | Admitting: Adult Health

## 2020-06-01 ENCOUNTER — Ambulatory Visit (INDEPENDENT_AMBULATORY_CARE_PROVIDER_SITE_OTHER): Payer: Medicare Other | Admitting: Adult Health

## 2020-06-01 DIAGNOSIS — Z760 Encounter for issue of repeat prescription: Secondary | ICD-10-CM | POA: Insufficient documentation

## 2020-06-01 DIAGNOSIS — F419 Anxiety disorder, unspecified: Secondary | ICD-10-CM | POA: Diagnosis not present

## 2020-06-01 DIAGNOSIS — I10 Essential (primary) hypertension: Secondary | ICD-10-CM | POA: Diagnosis not present

## 2020-06-01 DIAGNOSIS — Z5329 Procedure and treatment not carried out because of patient's decision for other reasons: Secondary | ICD-10-CM

## 2020-06-01 DIAGNOSIS — J019 Acute sinusitis, unspecified: Secondary | ICD-10-CM | POA: Diagnosis not present

## 2020-06-01 MED ORDER — DOXYCYCLINE HYCLATE 100 MG PO TABS: 100.0000 mg | ORAL_TABLET | Freq: Two times a day (BID) | ORAL | 0 refills | Status: DC

## 2020-06-01 MED ORDER — BACLOFEN 5 MG PO TABS: 5.0000 mg | ORAL_TABLET | Freq: Every evening | ORAL | 0 refills | Status: DC | PRN

## 2020-06-01 MED ORDER — DICLOFENAC SODIUM 75 MG PO TBEC: 75.0000 mg | DELAYED_RELEASE_TABLET | Freq: Every day | ORAL | 0 refills | Status: DC

## 2020-06-01 MED ORDER — AMLODIPINE BESYLATE 5 MG PO TABS: 5.0000 mg | ORAL_TABLET | Freq: Every day | ORAL | 0 refills | Status: DC

## 2020-06-01 MED ORDER — FLUTICASONE PROPIONATE 50 MCG/ACT NA SUSP: 2.0000 | Freq: Every day | NASAL | 0 refills | Status: DC

## 2020-06-01 NOTE — Progress Notes (Signed)
  Sheliah Hatch CMA tried to reach patient multiple attempts, no success. Chart kept open until 1:21 marked then as no show for virtual visit.

## 2020-06-01 NOTE — Progress Notes (Deleted)
    MyChart Video Visit    Virtual Visit via Video Note   This visit type was conducted due to national recommendations for restrictions regarding the COVID-19 Pandemic (e.g. social distancing) in an effort to limit this patient's exposure and mitigate transmission in our community. This patient is at least at moderate risk for complications without adequate follow up. This format is felt to be most appropriate for this patient at this time. Physical exam was limited by quality of the video and audio technology used for the visit.   Patient location: *** Provider location: ***  I discussed the limitations of evaluation and management by telemedicine and the availability of in person appointments. The patient expressed understanding and agreed to proceed.  Patient: Kari Hughes   DOB: [birth year]   69 y.o. Female  MRN: [id number] Visit Date: 06/01/2020  Today's healthcare provider: Jairo Ben, FNP   No chief complaint on file.  Subjective    HPI  Patient presents today to address pain management for chronic back and joint pain. Patient states that she was seen by Rheumatologist yesterday and prescribed her Gabapentin 300mg  {Show patient history (optional):23778::" "}  Medications: Outpatient Medications Prior to Visit  Medication Sig  . amLODipine (NORVASC) 5 MG tablet Take 1 tablet (5 mg total) by mouth daily.  . baclofen (LIORESAL) 10 MG tablet Take 1 tablet (10 mg total) by mouth 3 (three) times daily as needed for muscle spasms. (Patient not taking: Reported on 05/28/2020)  . ezetimibe (ZETIA) 10 MG tablet Take 1 tablet (10 mg total) by mouth daily. (Patient not taking: Reported on 05/28/2020)  . HYDROcodone-acetaminophen (NORCO) 5-325 MG tablet Take 1 tablet by mouth 3 (three) times daily as needed. (Patient not taking: Reported on 05/28/2020)   No facility-administered medications prior to visit.    Review of Systems  {Heme  Chem  Endocrine  Serology   Results Review (optional):23779::" "}  Objective    There were no vitals taken for this visit. {Show previous vital signs (optional):23777::" "}  Physical Exam     Assessment & Plan     ***  No follow-ups on file.     I discussed the assessment and treatment plan with the patient. The patient was provided an opportunity to ask questions and all were answered. The patient agreed with the plan and demonstrated an understanding of the instructions.   The patient was advised to call back or seek an in-person evaluation if the symptoms worsen or if the condition fails to improve as anticipated.  I provided *** minutes of non-face-to-face time during this encounter.  {provider attestation***:1}  05/30/2020, FNP [facility] [phone] (phone) [phone] (fax)  [facility]

## 2020-06-01 NOTE — Patient Instructions (Signed)
Hypertension, Adult Hypertension is another name for high blood pressure. High blood pressure forces your heart to work harder to pump blood. This can cause problems over time. There are two numbers in a blood pressure reading. There is a top number (systolic) over a bottom number (diastolic). It is best to have a blood pressure that is below 120/80. Healthy choices can help lower your blood pressure, or you may need medicine to help lower it. What are the causes? The cause of this condition is not known. Some conditions may be related to high blood pressure. What increases the risk?  Smoking.  Having type 2 diabetes mellitus, high cholesterol, or both.  Not getting enough exercise or physical activity.  Being overweight.  Having too much fat, sugar, calories, or salt (sodium) in your diet.  Drinking too much alcohol.  Having long-term (chronic) kidney disease.  Having a family history of high blood pressure.  Age. Risk increases with age.  Race. You may be at higher risk if you are African American.  Gender. Men are at higher risk than women before age 45. After age 65, women are at higher risk than men.  Having obstructive sleep apnea.  Stress. What are the signs or symptoms?  High blood pressure may not cause symptoms. Very high blood pressure (hypertensive crisis) may cause: ? Headache. ? Feelings of worry or nervousness (anxiety). ? Shortness of breath. ? Nosebleed. ? A feeling of being sick to your stomach (nausea). ? Throwing up (vomiting). ? Changes in how you see. ? Very bad chest pain. ? Seizures. How is this treated?  This condition is treated by making healthy lifestyle changes, such as: ? Eating healthy foods. ? Exercising more. ? Drinking less alcohol.  Your health care provider may prescribe medicine if lifestyle changes are not enough to get your blood pressure under control, and if: ? Your top number is above 130. ? Your bottom number is above  80.  Your personal target blood pressure may vary. Follow these instructions at home: Eating and drinking   If told, follow the DASH eating plan. To follow this plan: ? Fill one half of your plate at each meal with fruits and vegetables. ? Fill one fourth of your plate at each meal with whole grains. Whole grains include whole-wheat pasta, brown rice, and whole-grain bread. ? Eat or drink low-fat dairy products, such as skim milk or low-fat yogurt. ? Fill one fourth of your plate at each meal with low-fat (lean) proteins. Low-fat proteins include fish, chicken without skin, eggs, beans, and tofu. ? Avoid fatty meat, cured and processed meat, or chicken with skin. ? Avoid pre-made or processed food.  Eat less than 1,500 mg of salt each day.  Do not drink alcohol if: ? Your doctor tells you not to drink. ? You are pregnant, may be pregnant, or are planning to become pregnant.  If you drink alcohol: ? Limit how much you use to:  0-1 drink a day for women.  0-2 drinks a day for men. ? Be aware of how much alcohol is in your drink. In the [place] one drink equals one 12 oz bottle of beer (355 mL), one 5 oz glass of wine (148 mL), or one 1 oz glass of hard liquor (44 mL). Lifestyle   Work with your doctor to stay at a healthy weight or to lose weight. Ask your doctor what the best weight is for you.  Get at least 30 minutes of exercise most   days of the week. This may include walking, swimming, or biking.  Get at least 30 minutes of exercise that strengthens your muscles (resistance exercise) at least 3 days a week. This may include lifting weights or doing Pilates.  Do not use any products that contain nicotine or tobacco, such as cigarettes, e-cigarettes, and chewing tobacco. If you need help quitting, ask your doctor.  Check your blood pressure at home as told by your doctor.  Keep all follow-up visits as told by your doctor. This is important. Medicines  Take over-the-counter  and prescription medicines only as told by your doctor. Follow directions carefully.  Do not skip doses of blood pressure medicine. The medicine does not work as well if you skip doses. Skipping doses also puts you at risk for problems.  Ask your doctor about side effects or reactions to medicines that you should watch for. Contact a doctor if you:  Think you are having a reaction to the medicine you are taking.  Have headaches that keep coming back (recurring).  Feel dizzy.  Have swelling in your ankles.  Have trouble with your vision. Get help right away if you:  Get a very bad headache.  Start to feel mixed up (confused).  Feel weak or numb.  Feel faint.  Have very bad pain in your: ? Chest. ? Belly (abdomen).  Throw up more than once.  Have trouble breathing. Summary  Hypertension is another name for high blood pressure.  High blood pressure forces your heart to work harder to pump blood.  For most people, a normal blood pressure is less than 120/80.  Making healthy choices can help lower blood pressure. If your blood pressure does not get lower with healthy choices, you may need to take medicine. This information is not intended to replace advice given to you by your health care provider. Make sure you discuss any questions you have with your health care provider. Document Revised: 03/10/2018 Document Reviewed: 03/10/2018 Elsevier Patient Education  2020 Elsevier Inc. Generalized Anxiety Disorder, Adult Generalized anxiety disorder (GAD) is a mental health disorder. People with this condition constantly worry about everyday events. Unlike normal anxiety, worry related to GAD is not triggered by a specific event. These worries also do not fade or get better with time. GAD interferes with life functions, including relationships, work, and school. GAD can vary from mild to severe. People with severe GAD can have intense waves of anxiety with physical symptoms (panic  attacks). What are the causes? The exact cause of GAD is not known. What increases the risk? This condition is more likely to develop in:  Women.  People who have a family history of anxiety disorders.  People who are very shy.  People who experience very stressful life events, such as the death of a loved one.  People who have a very stressful family environment. What are the signs or symptoms? People with GAD often worry excessively about many things in their lives, such as their health and family. They may also be overly concerned about:  Doing well at work.  Being on time.  Natural disasters.  Friendships. Physical symptoms of GAD include:  Fatigue.  Muscle tension or having muscle twitches.  Trembling or feeling shaky.  Being easily startled.  Feeling like your heart is pounding or racing.  Feeling out of breath or like you cannot take a deep breath.  Having trouble falling asleep or staying asleep.  Sweating.  Nausea, diarrhea, or irritable bowel syndrome (IBS).    Headaches.  Trouble concentrating or remembering facts.  Restlessness.  Irritability. How is this diagnosed? Your health care provider can diagnose GAD based on your symptoms and medical history. You will also have a physical exam. The health care provider will ask specific questions about your symptoms, including how severe they are, when they started, and if they come and go. Your health care provider may ask you about your use of alcohol or drugs, including prescription medicines. Your health care provider may refer you to a mental health specialist for further evaluation. Your health care provider will do a thorough examination and may perform additional tests to rule out other possible causes of your symptoms. To be diagnosed with GAD, a person must have anxiety that:  Is out of his or her control.  Affects several different aspects of his or her life, such as work and  relationships.  Causes distress that makes him or her unable to take part in normal activities.  Includes at least three physical symptoms of GAD, such as restlessness, fatigue, trouble concentrating, irritability, muscle tension, or sleep problems. Before your health care provider can confirm a diagnosis of GAD, these symptoms must be present more days than they are not, and they must last for six months or longer. How is this treated? The following therapies are usually used to treat GAD:  Medicine. Antidepressant medicine is usually prescribed for long-term daily control. Antianxiety medicines may be added in severe cases, especially when panic attacks occur.  Talk therapy (psychotherapy). Certain types of talk therapy can be helpful in treating GAD by providing support, education, and guidance. Options include: ? Cognitive behavioral therapy (CBT). People learn coping skills and techniques to ease their anxiety. They learn to identify unrealistic or negative thoughts and behaviors and to replace them with positive ones. ? Acceptance and commitment therapy (ACT). This treatment teaches people how to be mindful as a way to cope with unwanted thoughts and feelings. ? Biofeedback. This process trains you to manage your body's response (physiological response) through breathing techniques and relaxation methods. You will work with a therapist while machines are used to monitor your physical symptoms.  Stress management techniques. These include yoga, meditation, and exercise. A mental health specialist can help determine which treatment is best for you. Some people see improvement with one type of therapy. However, other people require a combination of therapies. Follow these instructions at home:  Take over-the-counter and prescription medicines only as told by your health care provider.  Try to maintain a normal routine.  Try to anticipate stressful situations and allow extra time to manage  them.  Practice any stress management or self-calming techniques as taught by your health care provider.  Do not punish yourself for setbacks or for not making progress.  Try to recognize your accomplishments, even if they are small.  Keep all follow-up visits as told by your health care provider. This is important. Contact a health care provider if:  Your symptoms do not get better.  Your symptoms get worse.  You have signs of depression, such as: ? A persistently sad, cranky, or irritable mood. ? Loss of enjoyment in activities that used to bring you joy. ? Change in weight or eating. ? Changes in sleeping habits. ? Avoiding friends or family members. ? Loss of energy for normal tasks. ? Feelings of guilt or worthlessness. Get help right away if:  You have serious thoughts about hurting yourself or others. If you ever feel like you may hurt   yourself or others, or have thoughts about taking your own life, get help right away. You can go to your nearest emergency department or call:  Your local emergency services (911 in the [place]  A suicide crisis helpline, such as the National Suicide Prevention Lifeline at 1-800-273-8255. This is open 24 hours a day. Summary  Generalized anxiety disorder (GAD) is a mental health disorder that involves worry that is not triggered by a specific event.  People with GAD often worry excessively about many things in their lives, such as their health and family.  GAD may cause physical symptoms such as restlessness, trouble concentrating, sleep problems, frequent sweating, nausea, diarrhea, headaches, and trembling or muscle twitching.  A mental health specialist can help determine which treatment is best for you. Some people see improvement with one type of therapy. However, other people require a combination of therapies. This information is not intended to replace advice given to you by your health care provider. Make sure you discuss any  questions you have with your health care provider. Document Revised: 06/12/2017 Document Reviewed: 05/20/2016 Elsevier Patient Education  2020 Elsevier Inc.  

## 2020-06-01 NOTE — Progress Notes (Addendum)
Virtual telephone visit    Virtual Visit via Telephone Note   This visit type was conducted due to national recommendations for restrictions regarding the COVID-19 Pandemic (e.g. social distancing) in an effort to limit this patient's exposure and mitigate transmission in our community. Due to her co-morbid illnesses, this patient is at least at moderate risk for complications without adequate follow up. This format is felt to be most appropriate for this patient at this time. The patient did not have access to video technology or had technical difficulties with video requiring transitioning to audio format only (telephone). Physical exam was limited to content and character of the telephone converstion.   Participating Parties: Patient location: at home Provider location: Provider: Provider's office at  [facility], [place].     I discussed the limitations of evaluation and management by telemedicine and the availability of in person appointments. The patient expressed understanding and agreed to proceed.   Visit Date: 06/01/2020  Today's healthcare provider: Jairo Ben, FNP   Chief Complaint  Patient presents with  . Follow-up   Subjective    HPI   Patient presents today to address pain management for chronic back and joint pain. Patient states that she was seen by Rheumatologist yesterday and prescribed her Gabapentin 300mg .  She reports sinus symptoms for 3 weeks, facial pressure, green nasal discharge, denies cough or chest congestion. Reports tested negative for Covid.   She has seen pain clinic.   She has seen the rheumatologist on 05/31/2020.  Requesting Voltaren gel for body aches- had before she moved. Request refill on baclofen is helping her muscle spasms.   Patient  denies any fever, body aches,chills, rash, chest pain, shortness of breath, nausea, vomiting, or diarrhea.  Denies dizziness, lightheadedness, pre syncopal or  syncopal episodes.      Medications: Outpatient Medications Prior to Visit  Medication Sig  . atorvastatin (LIPITOR) 10 MG tablet Take by mouth.  . gabapentin (NEURONTIN) 300 MG capsule Take by mouth.  . predniSONE (DELTASONE) 10 MG tablet Take by mouth.  . ezetimibe (ZETIA) 10 MG tablet Take 1 tablet (10 mg total) by mouth daily. (Patient not taking: Reported on 05/28/2020)  . HYDROcodone-acetaminophen (NORCO) 5-325 MG tablet Take 1 tablet by mouth 3 (three) times daily as needed. (Patient not taking: Reported on 05/28/2020)  . [DISCONTINUED] amLODipine (NORVASC) 5 MG tablet Take 1 tablet (5 mg total) by mouth daily.  . [DISCONTINUED] baclofen (LIORESAL) 10 MG tablet Take 1 tablet (10 mg total) by mouth 3 (three) times daily as needed for muscle spasms. (Patient not taking: Reported on 05/28/2020)   No facility-administered medications prior to visit.    Review of Systems  Constitutional: Negative.   HENT: Positive for congestion, postnasal drip, rhinorrhea, sinus pressure and sinus pain. Negative for dental problem, drooling, ear discharge, ear pain, sneezing, sore throat, tinnitus, trouble swallowing and voice change.   Respiratory: Negative.   Cardiovascular: Negative.   Gastrointestinal: Negative.   Genitourinary: Negative.   Musculoskeletal: Positive for arthralgias.  Neurological: Negative.   Hematological: Negative.   Psychiatric/Behavioral: Negative.       Objective    There were no vitals taken for this visit. BP Readings from Last 3 Encounters:  05/08/20 (!) 157/89  05/03/20 (!) 182/119  03/27/20 121/80   Wt Readings from Last 3 Encounters:  05/08/20 142 lb 12.8 oz (64.8 kg)  05/03/20 135 lb (61.2 kg)  03/27/20 130 lb 1.1 oz (59 kg)    No  vitals avalible she reports has been out of blood pressure medication x 4 days. Lost her bottle.     Patient is alert and oriented and responsive to questions Engages in conversation with provider. Speaks in full  sentences without any pauses without any shortness of breath or distress.     Assessment & Plan     1. Hypertension, unspecified type Refilled.  - amLODipine (NORVASC) 5 MG tablet; Take 1 tablet (5 mg total) by mouth daily.  Dispense: 60 tablet; Refill: 0  2. Encounter for medication refill Denies any change in symptoms - chronic.  - baclofen 5 MG TABS; Take 5 mg by mouth at bedtime as needed for muscle spasms.  Dispense: 15 tablet; Refill: 0  3. Anxiety  - Ambulatory referral to Psychiatry  4. Acute non-recurrent sinusitis, unspecified location  - doxycycline (VIBRA-TABS) 100 MG tablet; Take 1 tablet (100 mg total) by mouth 2 (two) times daily.  Dispense: 20 tablet; Refill: 0  Return in about 3 weeks (around 06/22/2020), or if symptoms worsen or fail to improve, for at any time for any worsening symptoms, Go to Emergency room/ urgent care if worse.    I discussed the assessment and treatment plan with the patient. The patient was provided an opportunity to ask questions and all were answered. The patient agreed with the plan and demonstrated an understanding of the instructions.   The patient was advised to call back or seek an in-person evaluation if the symptoms worsen or if the condition fails to improve as anticipated.   Red Flags discussed. The patient was given clear instructions to go to ER or return to medical center if any red flags develop, symptoms do not improve, worsen or new problems develop. They verbalized understanding.  I provided 45 minutes of non-face-to-face time during this encounter.   I discussed the limitations of evaluation and management by telemedicine and the availability of in person appointments. The patient expressed understanding and agreed to proceed. Jairo Ben, FNP [facility] [phone] (phone) [phone] (fax)  [facility]

## 2020-06-05 ENCOUNTER — Telehealth (INDEPENDENT_AMBULATORY_CARE_PROVIDER_SITE_OTHER): Payer: Medicare Other

## 2020-06-05 NOTE — Telephone Encounter (Signed)
Copied from CRM [id number]. Topic: General - Other >> Jun 05, 2020  9:15 AM Wyonia Hough E wrote: Reason for CRM: Pt called to speak with someone that called her about her lab results / please advise

## 2020-06-06 ENCOUNTER — Ambulatory Visit: Payer: Medicare Other | Admitting: Cardiology

## 2020-06-06 NOTE — Telephone Encounter (Signed)
Contacted [facility] they state that patient had labs drawn on 05/31/20, they are faxing a copy of results to office. Lab tech states the RA factor is still pending. KW

## 2020-06-06 NOTE — Telephone Encounter (Signed)
Call completed.:  Provider called patient 06/06/2020 after receiving labs faxed over from [facility]. These labs dated 05/31/2020 are from rheumatology Dr. Allena Katz.  Labs received are within normal limits and patient is advised, however she is advised to call Dr. Eliane Decree office should she not hear from them by next week. Patient verbalized understanding of all instructions given and denies any further questions at this time.   Copy sent to be scanned in chart.

## 2020-06-06 NOTE — Telephone Encounter (Signed)
But that's from 05/10/20, [facility] said she had labs drawn 05/31/20. KW

## 2020-06-06 NOTE — Telephone Encounter (Signed)
Ok noted. Thanks for clarification will wait on fax.

## 2020-06-06 NOTE — Telephone Encounter (Signed)
     This lab result note is in, not sure if she had drawn again but these results are in Epic.   Berniece Pap, FNP  05/10/2020  4:59 PM EDT       Verify if any symptoms of infection or urinary infection. White blood cell count elevated. CMP ok. TSH for thyroid is elevated. Sent in Liptor 10mg  by mouth once daily forcholesterol elevation. Report any muscle pains, increased body aches or any unwanted side effects.  Recheck lipids and CMP in 6 weeks advised.    ANA and RA factor within normal limi

## 2020-06-06 NOTE — Telephone Encounter (Signed)
Patient states that she went to [facility] to have labs drawn in the beginning of November, will reach out to [facility] to get a copy of report. KW

## 2020-06-12 ENCOUNTER — Ambulatory Visit: Payer: Medicare Other | Admitting: Adult Health

## 2020-06-13 ENCOUNTER — Other Ambulatory Visit: Payer: Self-pay | Admitting: Adult Health

## 2020-06-13 ENCOUNTER — Ambulatory Visit (INDEPENDENT_AMBULATORY_CARE_PROVIDER_SITE_OTHER): Payer: Medicare Other | Admitting: Adult Health

## 2020-06-13 DIAGNOSIS — Z5329 Procedure and treatment not carried out because of patient's decision for other reasons: Secondary | ICD-10-CM

## 2020-06-13 NOTE — Telephone Encounter (Signed)
1. Pt cannot keep her appt today because her phone is dead.  She will not have a phone until Friday. And her sister hasto leave at 1:30 today  2. Pt wants Marcelino Duster to know she still has runny nose, cough, mucus is clear. Pt states she is tired of blowing her nose, it runs all the time. Pt does not feel good. Would like to know if you will call in something to   [id number] [place], [place] - [address] Phone:  [phone]  Fax:  [phone]     2. Pt would also like to know if she will call in a cream  for her arthritis? She knows it can be bought OTC, but she doesn't have any money.

## 2020-06-13 NOTE — Progress Notes (Signed)
No show

## 2020-06-13 NOTE — Telephone Encounter (Signed)
LMTCB, [facility] Triage Nurse may give patient results    I have left messages on both her phone lines to have her call so We can tell her she will need an in person visit.  Patient has had virtual visit already and was instructed that if her symptoms continued she would need to go to an urgent care center for in person treatment,.

## 2020-06-13 NOTE — Telephone Encounter (Signed)
Patient returned call- she is still having sinus drainage after treatment- advised per note- needs in person visit. Patient has been scheduled 06/14/20.

## 2020-06-14 ENCOUNTER — Ambulatory Visit: Payer: Medicare Other | Admitting: Adult Health

## 2020-06-14 NOTE — Telephone Encounter (Signed)
Pt. Given message and will go to UC today.

## 2020-06-14 NOTE — Telephone Encounter (Signed)
Apologies- misread instructions from office. Attempted to call patient- left message on her personal voice mail- in office appointment will have to be canceled due to her respiratory symptoms- advised please go to Urgent Care to follow up on these symptoms for in person visit. Sorry for any confusion it was my fault- please call office to confirm message was received.

## 2020-06-14 NOTE — Telephone Encounter (Signed)
Please disregard message- was meant for Smitty Knudsen, NP

## 2020-06-14 NOTE — Telephone Encounter (Signed)
I have tried to reach patient on both numbers this morning and she has not answered call. Did leave detailed message on her personal voice mail regarding her appointment and need to cancel it. Apologies for my mistake.

## 2020-06-14 NOTE — Telephone Encounter (Signed)
Attempted to call patient again- both numbers- no answer- did leave message asking patient to contact office regarding her appointment today

## 2020-06-14 NOTE — Telephone Encounter (Signed)
Noted  

## 2020-06-14 NOTE — Telephone Encounter (Signed)
Attempted to call patient - left message regarding appointment- see chart note

## 2020-06-20 ENCOUNTER — Telehealth: Payer: Self-pay | Admitting: Adult Health

## 2020-06-20 ENCOUNTER — Telehealth: Payer: Self-pay

## 2020-06-20 ENCOUNTER — Encounter: Payer: Medicare Other | Admitting: Cardiology

## 2020-06-20 NOTE — Telephone Encounter (Signed)
Copied from CRM [id number]. Topic: Medicare AWV >> Jun 20, 2020  1:13 PM Claudette Laws R wrote: Reason for CRM:  Left message for patient to call back and schedule Medicare Annual Wellness Visit (AWV) in office.   If not able to come in office, please offer to do virtually or by phone.   No hx of AWV - AWV-I eligible as of 07/14/2009  Please schedule at anytime with [facility] Health Advisor.   40 minute appointment  Any questions, please contact me at [phone]

## 2020-06-20 NOTE — Telephone Encounter (Signed)
2nd fax request sent to [facility] requesting records.

## 2020-06-21 ENCOUNTER — Encounter: Payer: Self-pay | Admitting: Cardiology

## 2020-06-28 ENCOUNTER — Telehealth: Payer: Self-pay

## 2020-06-28 NOTE — Telephone Encounter (Signed)
Copied from CRM [id number]. Topic: General - Other >> Jun 28, 2020  2:08 PM [name] wrote: Reason for CRM: Patient called in to inquire of Kari Hughes if she can get an Rx sent to the pharmacy for the runny nose, coughing, and excessive spitting since she went to the walk in clinic almost 2 weeks ago. Also complain that her left arm is in a lot of pain and feeling very heavy cant hardly put the arm down due to the pain and still experiencing vertigo that causes her to almost fall backward a few times and she would like a referral to an ENT ASAP please. Ph# [phone]

## 2020-06-29 NOTE — Telephone Encounter (Signed)
Contacted patient office/virtual appointment is more appropriate especially if patient has any URI like symptoms. Left message to contact office back to schedule. KW

## 2020-06-30 ENCOUNTER — Other Ambulatory Visit: Payer: Self-pay

## 2020-06-30 ENCOUNTER — Emergency Department
Admission: EM | Admit: 2020-06-30 | Discharge: 2020-07-01 | Disposition: A | Discharge status: Home / Self Care | Payer: Medicare Other | Attending: Emergency Medicine | Admitting: Emergency Medicine

## 2020-06-30 DIAGNOSIS — G894 Chronic pain syndrome: Secondary | ICD-10-CM

## 2020-06-30 DIAGNOSIS — Z20822 Contact with and (suspected) exposure to covid-19: Secondary | ICD-10-CM | POA: Insufficient documentation

## 2020-06-30 DIAGNOSIS — F331 Major depressive disorder, recurrent, moderate: Secondary | ICD-10-CM

## 2020-06-30 DIAGNOSIS — Z79899 Other long term (current) drug therapy: Secondary | ICD-10-CM | POA: Diagnosis not present

## 2020-06-30 DIAGNOSIS — M797 Fibromyalgia: Secondary | ICD-10-CM | POA: Diagnosis not present

## 2020-06-30 DIAGNOSIS — Z950 Presence of cardiac pacemaker: Secondary | ICD-10-CM | POA: Insufficient documentation

## 2020-06-30 DIAGNOSIS — F1290 Cannabis use, unspecified, uncomplicated: Secondary | ICD-10-CM | POA: Insufficient documentation

## 2020-06-30 DIAGNOSIS — I10 Essential (primary) hypertension: Secondary | ICD-10-CM | POA: Insufficient documentation

## 2020-06-30 NOTE — ED Notes (Signed)
Patient states she has chronic pain all over and does not want to live with her sister any longer.  Crying, not very detailed about how long she has had this pain.

## 2020-06-30 NOTE — ED Notes (Signed)
Patient is sitting in waiting. Patient was given a blanket. Patient is occasionally tearful. Verbal comfort given to patient.

## 2020-06-30 NOTE — ED Triage Notes (Signed)
Patient reports having pain all over.  States that she has seen several doctors about this and no one has been able to help.  Patient also wants to talk to someone about helping her find somewhere else to live away from her sister.

## 2020-07-01 DIAGNOSIS — F331 Major depressive disorder, recurrent, moderate: Secondary | ICD-10-CM | POA: Insufficient documentation

## 2020-07-01 DIAGNOSIS — M797 Fibromyalgia: Secondary | ICD-10-CM

## 2020-07-01 DIAGNOSIS — G894 Chronic pain syndrome: Secondary | ICD-10-CM | POA: Insufficient documentation

## 2020-07-01 LAB — CBC WITH DIFFERENTIAL/PLATELET
Abs Immature Granulocytes: 0.04 10*3/uL (ref 0.00–0.07)
Basophils Absolute: 0 10*3/uL (ref 0.0–0.1)
Basophils Relative: 0 %
Eosinophils Absolute: 0.2 10*3/uL (ref 0.0–0.5)
Eosinophils Relative: 2 %
HCT: 36.8 % (ref 36.0–46.0)
Hemoglobin: 12.1 g/dL (ref 12.0–15.0)
Immature Granulocytes: 0 %
Lymphocytes Relative: 24 %
Lymphs Abs: 2.2 10*3/uL (ref 0.7–4.0)
MCH: 30 pg (ref 26.0–34.0)
MCHC: 32.9 g/dL (ref 30.0–36.0)
MCV: 91.3 fL (ref 80.0–100.0)
Monocytes Absolute: 0.8 10*3/uL (ref 0.1–1.0)
Monocytes Relative: 9 %
Neutro Abs: 5.9 10*3/uL (ref 1.7–7.7)
Neutrophils Relative %: 65 %
Platelets: 296 10*3/uL (ref 150–400)
RBC: 4.03 MIL/uL (ref 3.87–5.11)
RDW: 16.4 % — ABNORMAL HIGH (ref 11.5–15.5)
WBC: 9.2 10*3/uL (ref 4.0–10.5)
nRBC: 0 % (ref 0.0–0.2)

## 2020-07-01 LAB — URINALYSIS, COMPLETE (UACMP) WITH MICROSCOPIC
Bacteria, UA: NONE SEEN
Bilirubin Urine: NEGATIVE
Glucose, UA: NEGATIVE mg/dL
Hgb urine dipstick: NEGATIVE
Ketones, ur: NEGATIVE mg/dL
Nitrite: NEGATIVE
Protein, ur: 100 mg/dL — AB
Specific Gravity, Urine: 1.023 (ref 1.005–1.030)
pH: 5 (ref 5.0–8.0)

## 2020-07-01 LAB — URINE DRUG SCREEN, QUALITATIVE (ARMC ONLY)
Amphetamines, Ur Screen: NOT DETECTED
Barbiturates, Ur Screen: NOT DETECTED
Benzodiazepine, Ur Scrn: NOT DETECTED
Cannabinoid 50 Ng, Ur ~~LOC~~: POSITIVE — AB
Cocaine Metabolite,Ur ~~LOC~~: NOT DETECTED
MDMA (Ecstasy)Ur Screen: NOT DETECTED
Methadone Scn, Ur: NOT DETECTED
Opiate, Ur Screen: NOT DETECTED
Phencyclidine (PCP) Ur S: NOT DETECTED
Tricyclic, Ur Screen: NOT DETECTED

## 2020-07-01 LAB — COMPREHENSIVE METABOLIC PANEL
ALT: 20 U/L (ref 0–44)
AST: 23 U/L (ref 15–41)
Albumin: 3.8 g/dL (ref 3.5–5.0)
Alkaline Phosphatase: 56 U/L (ref 38–126)
Anion gap: 11 (ref 5–15)
BUN: 23 mg/dL (ref 8–23)
CO2: 22 mmol/L (ref 22–32)
Calcium: 9.2 mg/dL (ref 8.9–10.3)
Chloride: 109 mmol/L (ref 98–111)
Creatinine, Ser: 0.95 mg/dL (ref 0.44–1.00)
GFR, Estimated: >60 mL/min (ref >60)
Glucose, Bld: 101 mg/dL — ABNORMAL HIGH (ref 70–99)
Potassium: 3.7 mmol/L (ref 3.5–5.1)
Sodium: 142 mmol/L (ref 135–145)
Total Bilirubin: 0.6 mg/dL (ref 0.3–1.2)
Total Protein: 6.9 g/dL (ref 6.5–8.1)

## 2020-07-01 LAB — SALICYLATE LEVEL: Salicylate Lvl: <7 mg/dL — ABNORMAL LOW (ref 7.0–30.0)

## 2020-07-01 LAB — ACETAMINOPHEN LEVEL: Acetaminophen (Tylenol), Serum: <10 ug/mL — ABNORMAL LOW (ref 10–30)

## 2020-07-01 LAB — TROPONIN I (HIGH SENSITIVITY): Troponin I (High Sensitivity): 7 ng/L (ref <18)

## 2020-07-01 LAB — RESP PANEL BY RT-PCR (FLU A&B, COVID) ARPGX2
Influenza A by PCR: NEGATIVE
Influenza B by PCR: NEGATIVE
SARS Coronavirus 2 by RT PCR: NEGATIVE

## 2020-07-01 LAB — ETHANOL: Alcohol, Ethyl (B): <10 mg/dL (ref <10)

## 2020-07-01 MED ORDER — AMLODIPINE BESYLATE 5 MG PO TABS: 5.0000 mg | ORAL_TABLET | Freq: Every day | ORAL | Status: DC

## 2020-07-01 MED ORDER — GABAPENTIN 300 MG PO CAPS: 300.0000 mg | ORAL_CAPSULE | Freq: Three times a day (TID) | ORAL | Status: DC

## 2020-07-01 MED ORDER — ATORVASTATIN CALCIUM 20 MG PO TABS: 10.0000 mg | ORAL_TABLET | Freq: Every day | ORAL | Status: DC

## 2020-07-01 MED ORDER — FLUTICASONE PROPIONATE 50 MCG/ACT NA SUSP: 2.0000 | Freq: Every day | NASAL | Status: DC

## 2020-07-01 MED ORDER — DICLOFENAC SODIUM 75 MG PO TBEC: 75.0000 mg | DELAYED_RELEASE_TABLET | Freq: Every day | ORAL | Status: DC

## 2020-07-01 MED ORDER — BACLOFEN 10 MG PO TABS
10.0000 mg | ORAL_TABLET | Freq: Two times a day (BID) | ORAL | Status: DC
  Administered 2020-07-01: 10 mg via ORAL
  Filled 2020-07-01 (×2): qty 1
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
## 2020-07-01 NOTE — Progress Notes (Signed)
Pharmacy records pulled in order to see pts medications. Last fills and medications from [facility]. Pt verified taking eliquis but unable to give strength. Pt poor historian at this time. Care everywhere shows poor history given during appointments. There is mention on care everywhere of Brilinta but was unable to confirm this with pharmacy and doctors could not confirm it based off of notes from prior appointments as well.

## 2020-07-01 NOTE — Discharge Instructions (Signed)
Please follow-up with your doctor regarding pain clinic referral.  Return to the ER for worsening symptoms, feelings of hurting yourself or others, or other concerns.

## 2020-07-01 NOTE — ED Notes (Signed)
Pt assisted to stand, ambulatory to toilet with Rolator and now reporting "I want to go" EDP aware  Coralee North, sister, [phone], to be called to pick up pt  Pt reports ready to go outside to smoke and wait

## 2020-07-01 NOTE — ED Notes (Signed)
Pharmacy contacted to reconcile patient's home medications

## 2020-07-01 NOTE — BH Assessment (Signed)
Assessment Note  Kari Hughes is an 69 y.o. female. Per triage note: Patient reports having pain all over.  States that she has seen several doctors about this and no one has been able to help.  Patient also wants to talk to someone about helping her find somewhere else to live away from her sister.  Pt presented with an unremarkable appearance. Pt was drowsy but gave a lucid account of her current situation. Pt's speech was slurred, logical, and coherent. Motor behavior appears normal evidenced by pt. resting quietly. Pt's thought content was relevant to the context of the situation. Eye contact was good. Pt had a normal attention span.  Pt's mood is depressed, affect is appropriate to circumstance. Pt admitted to having racing thoughts and feeling, "Unsettled". Patient was noted to have poor insight and judgement. Pt expressed that her problems are caused by her sister's lack of connection to her after the sister asked to come from [place] to with live her. Pt reports that she is not connected to any mental health providers. Pt explained that she has a hx of manic-depressive disorder however it is not being treated at this time. Pt reports that her main stressors are her physical issues, dissatisfaction with her living arrangement, and the conflict between her and her sister. Pt reported that she drinks  a glass of wine and smokes marijuana. Pt describes her substance use as sporadic. Pt explained that her last drink was yesterday. THE patient denied SI, HI, AV/hallucinations, or symptoms of paranoia. Patient was polite and forthcoming throughout the interview process.  Diagnosis: Adjustment disorder, unspecified  Past Medical History:  Past Medical History:  Diagnosis Date  . Hypertension     Past Surgical History:  Procedure Laterality Date  . JOINT REPLACEMENT Left 2013  . PACEMAKER PLACEMENT  2019    Family History: No family history on file.  Social History:  reports that she has never  smoked. She has never used smokeless tobacco. She reports previous alcohol use. She reports previous drug use.  Additional Social History:  Alcohol / Drug Use Pain Medications: See PTA Prescriptions: See PTA History of alcohol / drug use?: Yes Negative Consequences of Use: Personal relationships Substance #1 Name of Substance 1: Alcohol 1 - Frequency: Occasional 1 - Last Use / Amount: 06/30/20 Substance #2 Name of Substance 2: Marijuana 2 - Frequency: Occasional 2 - Last Use / Amount: 06/30/20  CIWA: CIWA-Ar BP: (!) 163/84 Pulse Rate: 64 COWS:    Allergies: No Known Allergies  Home Medications: (Not in a hospital admission)   OB/GYN Status:  No LMP recorded. Patient has had a hysterectomy.  General Assessment Data Location of Assessment: [facility] ED TTS Assessment: In system Is this a Tele or Face-to-Face Assessment?: Face-to-Face Is this an Initial Assessment or a Re-assessment for this encounter?: Initial Assessment Patient Accompanied by:: N/A Language Other than English: No Living Arrangements: Other (Comment) What gender do you identify as?: Female Date Telepsych consult ordered in [facility]: 07/01/20 Time Telepsych consult ordered in [facility]: 0016 Marital status: Single Maiden name: n/a Pregnancy Status: No Living Arrangements: Other relatives (Sister) Can pt return to current living arrangement?: Yes Admission Status: Voluntary Is patient capable of signing voluntary admission?: Yes Referral Source: Self/Family/Friend Insurance type: [employer] Exam ([facility] Walk-in ONLY) Medical Exam completed: Yes  Crisis Care Plan Living Arrangements: Other relatives (Sister) Legal Guardian: Other: (Self) Name of Psychiatrist: None noted Name of Therapist: None noted  Education Status Is patient currently  in school?: No Is the patient employed, unemployed or receiving disability?: Receiving disability income  Risk to self with the past 6  months Suicidal Ideation: No Has patient been a risk to self within the past 6 months prior to admission? : No Suicidal Intent: No Has patient had any suicidal intent within the past 6 months prior to admission? : No Is patient at risk for suicide?: No Suicidal Plan?: No Has patient had any suicidal plan within the past 6 months prior to admission? : No Access to Means: No What has been your use of drugs/alcohol within the last 12 months?: Alcoho; Marijuana Previous Attempts/Gestures: No How many times?: 0 Other Self Harm Risks: n/a Triggers for Past Attempts: Other (Comment) (n/a) Intentional Self Injurious Behavior: None Family Suicide History: Unknown Recent stressful life event(s): Conflict (Comment) Persecutory voices/beliefs?: No Depression: Yes Depression Symptoms: Feeling worthless/self pity Substance abuse history and/or treatment for substance abuse?: No Suicide prevention information given to non-admitted patients: Not applicable  Risk to Others within the past 6 months Homicidal Ideation: No Does patient have any lifetime risk of violence toward others beyond the six months prior to admission? : No Thoughts of Harm to Others: No Current Homicidal Intent: No Current Homicidal Plan: No Access to Homicidal Means: No Identified Victim: n/a History of harm to others?: No Assessment of Violence: None Noted Violent Behavior Description: n/a Does patient have access to weapons?: No Criminal Charges Pending?: No Does patient have a court date: No Is patient on probation?: No  Psychosis Hallucinations: None noted Delusions: None noted  Mental Status Report Appearance/Hygiene: Unremarkable Eye Contact: Good Motor Activity: Freedom of movement Speech: Logical/coherent Level of Consciousness: Quiet/awake Mood: Despair Affect: Appropriate to circumstance Anxiety Level: Minimal Thought Processes: Relevant,Coherent Judgement: Impaired Orientation:  Person,Place,Time,Situation Obsessive Compulsive Thoughts/Behaviors: Moderate  Cognitive Functioning Concentration: Normal Memory: Remote Intact,Recent Intact Is patient IDD: No Insight: Poor Impulse Control: Poor Appetite: Poor Have you had any weight changes? : No Change Sleep: No Change Total Hours of Sleep: 8 Vegetative Symptoms: None  ADLScreening [facility] Assessment Services) Patient's cognitive ability adequate to safely complete daily activities?: Yes Patient able to express need for assistance with ADLs?: Yes Independently performs ADLs?: Yes (appropriate for developmental age)  Prior Inpatient Therapy Prior Inpatient Therapy: No  Prior Outpatient Therapy Prior Outpatient Therapy: No Does patient have an ACCT team?: No Does patient have Intensive In-House Services?  : No Does patient have [facility] services? : No Does patient have P4CC services?: No  ADL Screening (condition at time of admission) Patient's cognitive ability adequate to safely complete daily activities?: Yes Is the patient deaf or have difficulty hearing?: No Does the patient have difficulty seeing, even when wearing glasses/contacts?: No Does the patient have difficulty concentrating, remembering, or making decisions?: No Patient able to express need for assistance with ADLs?: Yes Does the patient have difficulty dressing or bathing?: No Independently performs ADLs?: Yes (appropriate for developmental age) Does the patient have difficulty walking or climbing stairs?: No Weakness of Legs: None Weakness of Arms/Hands: None  Home Assistive Devices/Equipment Home Assistive Devices/Equipment: None  Therapy Consults (therapy consults require a physician order) PT Evaluation Needed: No OT Evalulation Needed: No SLP Evaluation Needed: No Abuse/Neglect Assessment (Assessment to be complete while patient is alone) Abuse/Neglect Assessment Can Be Completed: Yes Physical Abuse: Denies Verbal Abuse:  Denies Sexual Abuse: Denies Exploitation of patient/patient's resources: Denies Self-Neglect: Denies Values / Beliefs Cultural Requests During Hospitalization: None Spiritual Requests During Hospitalization: None Consults Spiritual Care Consult  Needed: No Transition of Care Team Consult Needed: No Advance Directives (For Healthcare) Does Patient Have a Medical Advance Directive?: No          Disposition: Pending psych consult. Disposition Initial Assessment Completed for this Encounter: Yes  On Site Evaluation by:   Reviewed with Physician:    Foy Guadalajara 07/01/2020 4:24 AM

## 2020-07-01 NOTE — ED Notes (Signed)
Peripheral IV discontinued. Catheter intact. No signs of infiltration or redness. Gauze applied to IV site.   Discharge instructions reviewed with patient. Questions fielded by this RN. Patient verbalizes understanding of instructions. Patient discharged home in stable condition per [facility] . No acute distress noted at time of discharge.   Pt ambulatory to lobby, walked to front, sister said on phone "I'll be there in ten minutes"

## 2020-07-03 ENCOUNTER — Ambulatory Visit (INDEPENDENT_AMBULATORY_CARE_PROVIDER_SITE_OTHER): Payer: Medicare Other | Admitting: Adult Health

## 2020-07-03 ENCOUNTER — Encounter: Payer: Self-pay | Admitting: Adult Health

## 2020-07-03 ENCOUNTER — Ambulatory Visit: Payer: Medicare Other | Admitting: Adult Health

## 2020-07-03 DIAGNOSIS — J029 Acute pharyngitis, unspecified: Secondary | ICD-10-CM | POA: Diagnosis not present

## 2020-07-03 DIAGNOSIS — Z1231 Encounter for screening mammogram for malignant neoplasm of breast: Secondary | ICD-10-CM | POA: Diagnosis not present

## 2020-07-03 DIAGNOSIS — J0141 Acute recurrent pansinusitis: Secondary | ICD-10-CM | POA: Diagnosis not present

## 2020-07-03 DIAGNOSIS — H6691 Otitis media, unspecified, right ear: Secondary | ICD-10-CM | POA: Diagnosis not present

## 2020-07-03 DIAGNOSIS — Z8719 Personal history of other diseases of the digestive system: Secondary | ICD-10-CM

## 2020-07-03 MED ORDER — AMOXICILLIN-POT CLAVULANATE 875-125 MG PO TABS: 1.0000 | ORAL_TABLET | Freq: Two times a day (BID) | ORAL | 0 refills | Status: DC

## 2020-07-03 NOTE — Patient Instructions (Addendum)
Call to schedule your screening mammogram. Your orders have been placed for your exam.  Let our office know if you have questions, concerns, or any difficulty scheduling.  If normal results then yearly screening mammograms are recommended unless you notice  Changes in your breast then you should schedule a follow up office visit. If abnormal results  Further imaging will be warranted and sooner follow up as determined by the radiologist at the [facility].   [facility] at [facility] [address] [place], [place] [address]  Main: [phone]   Amoxicillin; Clavulanic Acid Tablets What is this medicine? AMOXICILLIN; CLAVULANIC ACID (a mox i SIL in; KLAV yoo lan ic AS id) is a penicillin antibiotic. It treats some infections caused by bacteria. It will not work for colds, the flu, or other viruses. This medicine may be used for other purposes; ask your health care provider or pharmacist if you have questions. COMMON BRAND NAME(S): Augmentin What should I tell my health care provider before I take this medicine? They need to know if you have any of these conditions:  bowel disease, like colitis  kidney disease  liver disease  mononucleosis  an unusual or allergic reaction to amoxicillin, penicillin, cephalosporin, other antibiotics, clavulanic acid, other medicines, foods, dyes, or preservatives  pregnant or trying to get pregnant  breast-feeding How should I use this medicine? Take this drug by mouth. Take it as directed on the prescription label at the same time every day. Take it with food at the start of a meal or snack. Take all of this drug unless your health care provider tells you to stop it early. Keep taking it even if you think you are better. Talk to your health care provider about the use of this drug in children. While it may be prescribed for selected conditions, precautions do apply. Overdosage: If you think you have taken too much of this  medicine contact a poison control center or emergency room at once. NOTE: This medicine is only for you. Do not share this medicine with others. What if I miss a dose? If you miss a dose, take it as soon as you can. If it is almost time for your next dose, take only that dose. Do not take double or extra doses. What may interact with this medicine?  allopurinol  anticoagulants  birth control pills  methotrexate  probenecid This list may not describe all possible interactions. Give your health care provider a list of all the medicines, herbs, non-prescription drugs, or dietary supplements you use. Also tell them if you smoke, drink alcohol, or use illegal drugs. Some items may interact with your medicine. What should I watch for while using this medicine? Tell your doctor or healthcare provider if your symptoms do not improve. This medicine may cause serious skin reactions. They can happen weeks to months after starting the medicine. Contact your healthcare provider right away if you notice fevers or flu-like symptoms with a rash. The rash may be red or purple and then turn into blisters or peeling of the skin. Or, you might notice a red rash with swelling of the face, lips or lymph nodes in your neck or under your arms. Do not treat diarrhea with over the counter products. Contact your doctor if you have diarrhea that lasts more than 2 days or if it is severe and watery. If you have diabetes, you may get a false-positive result for sugar in your urine. Check with your doctor  or healthcare provider. Birth control pills may not work properly while you are taking this medicine. Talk to your doctor about using an extra method of birth control. What side effects may I notice from receiving this medicine? Side effects that you should report to your doctor or health care professional as soon as possible:  allergic reactions like skin rash, itching or hives, swelling of the face, lips, or  tongue  breathing problems  dark urine  fever or chills, sore throat  redness, blistering, peeling, or loosening of the skin, including inside the mouth  seizures  trouble passing urine or change in the amount of urine  unusual bleeding, bruising  unusually weak or tired  white patches or sores in the mouth or throat Side effects that usually do not require medical attention (report to your doctor or health care professional if they continue or are bothersome):  diarrhea  dizziness  headache  nausea, vomiting  stomach upset  vaginal or anal irritation This list may not describe all possible side effects. Call your doctor for medical advice about side effects. You may report side effects to FDA at 1-800-FDA-1088. Where should I keep my medicine? Keep out of the reach of children and pets. Store at room temperature between 20 and 25 degrees C (68 and 77 degrees F). Throw away any unused drug after the expiration date. NOTE: This sheet is a summary. It may not cover all possible information. If you have questions about this medicine, talk to your doctor, pharmacist, or health care provider.  2020 Elsevier/Gold Standard (2019-01-31 11:55:53)  Sinusitis, Adult Sinusitis is soreness and swelling (inflammation) of your sinuses. Sinuses are hollow spaces in the bones around your face. They are located:  Around your eyes.  In the middle of your forehead.  Behind your nose.  In your cheekbones. Your sinuses and nasal passages are lined with a fluid called mucus. Mucus drains out of your sinuses. Swelling can trap mucus in your sinuses. This lets germs (bacteria, virus, or fungus) grow, which leads to infection. Most of the time, this condition is caused by a virus. What are the causes? This condition is caused by:  Allergies.  Asthma.  Germs.  Things that block your nose or sinuses.  Growths in the nose (nasal polyps).  Chemicals or irritants in the  air.  Fungus (rare). What increases the risk? You are more likely to develop this condition if:  You have a weak body defense system (immune system).  You do a lot of swimming or diving.  You use nasal sprays too much.  You smoke. What are the signs or symptoms? The main symptoms of this condition are pain and a feeling of pressure around the sinuses. Other symptoms include:  Stuffy nose (congestion).  Runny nose (drainage).  Swelling and warmth in the sinuses.  Headache.  Toothache.  A cough that may get worse at night.  Mucus that collects in the throat or the back of the nose (postnasal drip).  Being unable to smell and taste.  Being very tired (fatigue).  A fever.  Sore throat.  Bad breath. How is this diagnosed? This condition is diagnosed based on:  Your symptoms.  Your medical history.  A physical exam.  Tests to find out if your condition is short-term (acute) or long-term (chronic). Your doctor may: ? Check your nose for growths (polyps). ? Check your sinuses using a tool that has a light (endoscope). ? Check for allergies or germs. ? Do imaging  tests, such as an MRI or CT scan. How is this treated? Treatment for this condition depends on the cause and whether it is short-term or long-term.  If caused by a virus, your symptoms should go away on their own within 10 days. You may be given medicines to relieve symptoms. They include: ? Medicines that shrink swollen tissue in the nose. ? Medicines that treat allergies (antihistamines). ? A spray that treats swelling of the nostrils. ? Rinses that help get rid of thick mucus in your nose (nasal saline washes).  If caused by bacteria, your doctor may wait to see if you will get better without treatment. You may be given antibiotic medicine if you have: ? A very bad infection. ? A weak body defense system.  If caused by growths in the nose, you may need to have surgery. Follow these instructions  at home: Medicines  Take, use, or apply over-the-counter and prescription medicines only as told by your doctor. These may include nasal sprays.  If you were prescribed an antibiotic medicine, take it as told by your doctor. Do not stop taking the antibiotic even if you start to feel better. Hydrate and humidify   Drink enough water to keep your pee (urine) pale yellow.  Use a cool mist humidifier to keep the humidity level in your home above 50%.  Breathe in steam for 10-15 minutes, 3-4 times a day, or as told by your doctor. You can do this in the bathroom while a hot shower is running.  Try not to spend time in cool or dry air. Rest  Rest as much as you can.  Sleep with your head raised (elevated).  Make sure you get enough sleep each night. General instructions   Put a warm, moist washcloth on your face 3-4 times a day, or as often as told by your doctor. This will help with discomfort.  Wash your hands often with soap and water. If there is no soap and water, use hand sanitizer.  Do not smoke. Avoid being around people who are smoking (secondhand smoke).  Keep all follow-up visits as told by your doctor. This is important. Contact a doctor if:  You have a fever.  Your symptoms get worse.  Your symptoms do not get better within 10 days. Get help right away if:  You have a very bad headache.  You cannot stop throwing up (vomiting).  You have very bad pain or swelling around your face or eyes.  You have trouble seeing.  You feel confused.  Your neck is stiff.  You have trouble breathing. Summary  Sinusitis is swelling of your sinuses. Sinuses are hollow spaces in the bones around your face.  This condition is caused by tissues in your nose that become inflamed or swollen. This traps germs. These can lead to infection.  If you were prescribed an antibiotic medicine, take it as told by your doctor. Do not stop taking it even if you start to feel  better.  Keep all follow-up visits as told by your doctor. This is important. This information is not intended to replace advice given to you by your health care provider. Make sure you discuss any questions you have with your health care provider. Document Revised: 11/30/2017 Document Reviewed: 11/30/2017 Elsevier Patient Education  2020 Elsevier Inc.    Amoxicillin; Clavulanic Acid Tablets What is this medicine? AMOXICILLIN; CLAVULANIC ACID (a mox i SIL in; KLAV yoo lan ic AS id) is a penicillin antibiotic. It treats  some infections caused by bacteria. It will not work for colds, the flu, or other viruses. This medicine may be used for other purposes; ask your health care provider or pharmacist if you have questions. COMMON BRAND NAME(S): Augmentin What should I tell my health care provider before I take this medicine? They need to know if you have any of these conditions:  bowel disease, like colitis  kidney disease  liver disease  mononucleosis  an unusual or allergic reaction to amoxicillin, penicillin, cephalosporin, other antibiotics, clavulanic acid, other medicines, foods, dyes, or preservatives  pregnant or trying to get pregnant  breast-feeding How should I use this medicine? Take this drug by mouth. Take it as directed on the prescription label at the same time every day. Take it with food at the start of a meal or snack. Take all of this drug unless your health care provider tells you to stop it early. Keep taking it even if you think you are better. Talk to your health care provider about the use of this drug in children. While it may be prescribed for selected conditions, precautions do apply. Overdosage: If you think you have taken too much of this medicine contact a poison control center or emergency room at once. NOTE: This medicine is only for you. Do not share this medicine with others. What if I miss a dose? If you miss a dose, take it as soon as you can. If  it is almost time for your next dose, take only that dose. Do not take double or extra doses. What may interact with this medicine?  allopurinol  anticoagulants  birth control pills  methotrexate  probenecid This list may not describe all possible interactions. Give your health care provider a list of all the medicines, herbs, non-prescription drugs, or dietary supplements you use. Also tell them if you smoke, drink alcohol, or use illegal drugs. Some items may interact with your medicine. What should I watch for while using this medicine? Tell your doctor or healthcare provider if your symptoms do not improve. This medicine may cause serious skin reactions. They can happen weeks to months after starting the medicine. Contact your healthcare provider right away if you notice fevers or flu-like symptoms with a rash. The rash may be red or purple and then turn into blisters or peeling of the skin. Or, you might notice a red rash with swelling of the face, lips or lymph nodes in your neck or under your arms. Do not treat diarrhea with over the counter products. Contact your doctor if you have diarrhea that lasts more than 2 days or if it is severe and watery. If you have diabetes, you may get a false-positive result for sugar in your urine. Check with your doctor or healthcare provider. Birth control pills may not work properly while you are taking this medicine. Talk to your doctor about using an extra method of birth control. What side effects may I notice from receiving this medicine? Side effects that you should report to your doctor or health care professional as soon as possible:  allergic reactions like skin rash, itching or hives, swelling of the face, lips, or tongue  breathing problems  dark urine  fever or chills, sore throat  redness, blistering, peeling, or loosening of the skin, including inside the mouth  seizures  trouble passing urine or change in the amount of  urine  unusual bleeding, bruising  unusually weak or tired  white patches or sores in the  mouth or throat Side effects that usually do not require medical attention (report to your doctor or health care professional if they continue or are bothersome):  diarrhea  dizziness  headache  nausea, vomiting  stomach upset  vaginal or anal irritation This list may not describe all possible side effects. Call your doctor for medical advice about side effects. You may report side effects to FDA at 1-800-FDA-1088. Where should I keep my medicine? Keep out of the reach of children and pets. Store at room temperature between 20 and 25 degrees C (68 and 77 degrees F). Throw away any unused drug after the expiration date. NOTE: This sheet is a summary. It may not cover all possible information. If you have questions about this medicine, talk to your doctor, pharmacist, or health care provider.  2020 Elsevier/Gold Standard (2019-01-31 11:55:53)

## 2020-07-03 NOTE — Progress Notes (Addendum)
Virtual telephone visit    Virtual Visit via Telephone Note   This visit type was conducted due to national recommendations for restrictions regarding the COVID-19 Pandemic (e.g. social distancing) in an effort to limit this patient's exposure and mitigate transmission in our community. Due to her co-morbid illnesses, this patient is at least at moderate risk for complications without adequate follow up. This format is felt to be most appropriate for this patient at this time. The patient did not have access to video technology or had technical difficulties with video requiring transitioning to audio format only (telephone). Physical exam was limited to content and character of the telephone converstion.   Parties involved in visit as below:    Patient location: at home  Provider location: Provider: Provider's office at  [facility] [place].     I discussed the limitations of evaluation and management by telemedicine and the availability of in person appointments. The patient expressed understanding and agreed to proceed.   Visit Date: 07/03/2020  Today's healthcare provider: Jairo Ben, FNP   Chief Complaint  Patient presents with   Sinus Problem   Subjective    URI  This is a recurrent problem. The current episode started 1 to 4 weeks ago (10 days. ). The problem has been unchanged. There has been no fever. Associated symptoms include congestion, coughing, headaches, joint pain, joint swelling (auto immune. sees rheumatology. ), rhinorrhea and a sore throat. Pertinent negatives include no abdominal pain, chest pain, diarrhea, dysuria, ear pain, nausea, neck pain, plugged ear sensation, rash, sinus pain, sneezing, swollen glands, vomiting or wheezing. Associated symptoms comments: Patient reports that left arm is heavy and painful and states that pain is intermittent . She has tried nothing for the symptoms. The treatment provided no relief.     Patient  denies any fever, body aches,chills, rash, chest pain, shortness of breath, nausea, vomiting, or diarrhea.  Denies dizziness, lightheadedness, pre syncopal or syncopal episodes.      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   Apixaban (ELIQUIS PO) Take by mouth.   atorvastatin (LIPITOR) 10 MG tablet Take 10 mg by mouth daily.   baclofen (LIORESAL) 10 MG tablet Take 10 mg by mouth 2 (two) times daily.   diclofenac (VOLTAREN) 75 MG EC tablet Take 1 tablet (75 mg total) by mouth daily.   ezetimibe (ZETIA) 10 MG tablet Take 1 tablet (10 mg total) by mouth daily. (Patient not taking: No sig reported)   fluticasone (FLONASE) 50 MCG/ACT nasal spray Place 2 sprays into both nostrils daily.   gabapentin (NEURONTIN) 300 MG capsule Take 300  mg by mouth 3 (three) times daily.   meloxicam (MOBIC) 15 MG tablet Take 15 mg by mouth daily.   No facility-administered medications prior to visit.    Review of Systems  HENT: Positive for congestion, rhinorrhea and sore throat. Negative for ear pain, sinus pain and sneezing.    Respiratory: Positive for cough. Negative for wheezing.   Cardiovascular: Negative for chest pain.  Gastrointestinal: Negative for abdominal pain, diarrhea, nausea and vomiting.  Genitourinary: Negative for dysuria.  Musculoskeletal: Positive for joint pain. Negative for neck pain.  Skin: Negative for rash.  Neurological: Positive for headaches.    Last CBC Lab Results  Component Value Date   WBC 9.2 07/01/2020   HGB 12.1 07/01/2020   HCT 36.8 07/01/2020   MCV 91.3 07/01/2020   MCH 30.0 07/01/2020   RDW 16.4 (H) 07/01/2020   PLT 296 07/01/2020   Last metabolic panel Lab Results  Component Value Date   GLUCOSE 101 (H) 07/01/2020   NA 142 07/01/2020   K 3.7 07/01/2020   CL 109 07/01/2020   CO2 22 07/01/2020   BUN 23 07/01/2020   CREATININE 0.95 07/01/2020   GFRNONAA >60 07/01/2020   GFRAA 73 05/08/2020   CALCIUM 9.2 07/01/2020   PROT 6.9 07/01/2020   ALBUMIN 3.8 07/01/2020   LABGLOB 2.4 05/08/2020   AGRATIO 1.9 05/08/2020   BILITOT 0.6 07/01/2020   ALKPHOS 56 07/01/2020   AST 23 07/01/2020   ALT 20 07/01/2020   ANIONGAP 11 07/01/2020   Last lipids Lab Results  Component Value Date   CHOL 225 (H) 05/08/2020   HDL 86 05/08/2020   LDLCALC 110 (H) 05/08/2020   TRIG 174 (H) 05/08/2020   Last hemoglobin A1c No results found for: HGBA1C Last thyroid functions Lab Results  Component Value Date   TSH 2.920 05/08/2020   Last vitamin D No results found for: 25OHVITD2, 25OHVITD3, VD25OH Last vitamin B12 and Folate No results found for: VITAMINB12, FOLATE    Objective    There were no vitals taken for this visit. BP Readings from Last 3 Encounters:  07/01/20 (!) 157/87  05/08/20 (!) 157/89  05/03/20 (!) 182/119   Wt Readings from Last 3 Encounters:  06/30/20 170 lb (77.1 kg)  05/08/20 142 lb 12.8 oz (64.8 kg)  05/03/20 135 lb (61.2 kg)     No vitals available reports afebrile.   Patient is alert and oriented and responsive to questions Engages in  conversation with provider. Speaks in full sentences without any pauses without any shortness of breath or distress.      Assessment & Plan     Pharyngitis, unspecified etiology - Plan: amoxicillin-clavulanate (AUGMENTIN) 875-125 MG tablet, Ambulatory referral to ENT, [facility]  Right otitis media, unspecified otitis media type - Plan: amoxicillin-clavulanate (AUGMENTIN) 875-125 MG tablet, Ambulatory referral to ENT, [facility] Scre[facility]  Acute recurrent pansinusitis - Plan: Ambulatory referral to ENT, [facility] Scre[facility]  Encounter for screening mammogram for malignant neoplasm of breast - Plan: [facility] Screening  History of pancreatitis - Plan: Ambulatory referral to Gastroenterology  She needs gastroenterology here asks for referral, waiting on records for last colonoscopy. She has no GI symptoms currently she reports.   Screening mammogram was ordered and she was given the number to the [facility] after she has no symptoms and not within 6 weeks of covid booster or immunization.  Referral to ENT, placed she saw ENT in [facility] for recurrent otitis media and sinusitis and request an  ENT here.   Meds ordered this encounter  Medications   amoxicillin-clavulanate (AUGMENTIN) 875-125 MG tablet    Sig: Take 1 tablet by mouth 2 (two) times daily.    Dispense:  20 tablet    Refill:  0    Return in about 1 week (around 07/10/2020), or if symptoms worsen or fail to improve, for at any time for any worsening symptoms, Go to Emergency room/ urgent care if worse.    I discussed the assessment and treatment plan with the patient. The patient was provided an opportunity to ask questions and all were answered. The patient agreed with the plan and demonstrated an understanding of the instructions.   The patient was advised to call back or seek an in-person evaluation if the symptoms worsen or if the condition fails to improve as anticipated.  I provided 40 minutes of  non-face-to-face time during this encounter.  Red Flags discussed. The patient was given clear instructions to go to ER or return to medical center if any red flags develop, symptoms do not improve, worsen or new problems develop. They verbalized understanding.  I discussed the limitations of evaluation and management by telemedicine and the availability of in person appointments. The patient expressed understanding and agreed to proceed.   Jairo Ben, FNP [facility] [phone] (phone) [phone] (fax)  [facility]

## 2020-07-03 NOTE — Telephone Encounter (Signed)
Patient has been seen, she made no visit of vertigo or left arm pain during visit, she did complain of her " normal polyarthralgia aches".  She was advised emergency room  for in person evaluation if any symptom persist, change or worsen.

## 2020-07-10 ENCOUNTER — Emergency Department
Admission: EM | Admit: 2020-07-10 | Discharge: 2020-07-10 | Discharge status: Against Medical Advice | Payer: Medicare Other

## 2020-07-11 ENCOUNTER — Other Ambulatory Visit: Payer: Self-pay | Admitting: Adult Health

## 2020-07-11 ENCOUNTER — Telehealth: Payer: Self-pay | Admitting: Adult Health

## 2020-07-11 NOTE — Telephone Encounter (Signed)
Copied from CRM [id number]. Topic: Quick Communication - Rx Refill/Question >> Jul 11, 2020  4:37 PM Cuthrell, Pearlean Brownie wrote: Medication: gabapentin (NEURONTIN) 300 MG capsule [[id number]  Has the patient contacted their pharmacy? Yes  (Agent: If yes, when and what did the pharmacy advise?)  Preferred Pharmacy (with phone number or street name): [id number] - [place], [place] [address]  Phone:  [phone] Fax:  [phone]  Agent: Please be advised that RX refills may take up to 3 business days. We ask that you follow-up with your pharmacy.

## 2020-07-11 NOTE — Progress Notes (Signed)
Established Patient Office Visit  Subjective:  Patient ID: Kari Hughes, female    DOB: 1951/05/24  Age: 69 y.o. MRN: [id number]  CC:  Chief Complaint  Patient presents with   Follow-up    HPI Kari Hughes presents for follow up visit to address chronic muscle and joint pain.  She continues to have chronic pain and was referred to pain center, she has seen orthopedics and  Fibromyalgia sees rheumatology. She sees Dr. Allena Katz. He sent lidocaine patches she reports she did not have them at pharmacy,   Orthopedics started her on Baclofen - muscle relaxer she says it helps some.   She reports she seen pain clinic , checked her urine and she had an appointment on 06/28/2020 and missed it.   She missed her cardiology appointment.  She has been taking Brillianta 60 mg BID  and this is not on her mediation list she still has this at home. She is not out. She has her bottle and pills at home.  She has eliquis listed on her medication list self reported but she reports she is not on Eliquis and has never been however she did report it at last visit. Will supply one month Brillianta  supply and she will have cardiology for follow up she missed her appointment. discuss not taking any other blood thinner and also avoiding NSAID's and antiinflammatories when possible due to increase  Cardiology called her on 07/12/2020 and [employer] pacemaker received disconnected monitor for over 21 days and patient reported she has not had a monitor for " years". cardiology having new monitor sent  to patient. She no showed for her cardiology appointment and has a appointment in February now.   She went to hospital yesterday and left. She had 50 people ahead of her.   Psychiatry has sent her new paper work.  Urine drug screen positive for marjuanna on 07/01/2020 and Covid  negative.   Reports that she has chronic pain, crying and tearful an reports my entire body hurts.   Patient  denies any fever, chills,  rash, chest pain, shortness of breath, nausea, vomiting, or diarrhea.   Denies dizziness, lightheadedness, pre syncopal or syncopal episodes.    Past Medical History:  Diagnosis Date   Hypertension     Past Surgical History:  Procedure Laterality Date   JOINT REPLACEMENT Left 2013   PACEMAKER PLACEMENT  2019    History reviewed. No pertinent family history.  Social History   Socioeconomic History   Marital status: Single    Spouse name: Not on file   Number of children: Not on file   Years of education: Not on file   Highest education level: Not on file  Occupational History   Not on file  Tobacco Use   Smoking status: Never Smoker   Smokeless tobacco: Never Used  Substance and Sexual Activity   Alcohol use: Not Currently   Drug use: Not Currently   Sexual activity: Not on file  Other Topics Concern   Not on file  Social History Narrative   Not on file   Social Determinants of Health   Financial Resource Strain: Not on file  Food Insecurity: Not on file  Transportation Needs: Not on file  Physical Activity: Not on file  Stress: Not on file  Social Connections: Not on file  Intimate Partner Violence: Not on file    Outpatient Medications Prior to Visit  Medication Sig Dispense Refill   amLODipine (NORVASC) 5 MG tablet Take 1  tablet (5 mg total) by mouth daily. 60 tablet 0   atorvastatin (LIPITOR) 10 MG tablet Take 10 mg by mouth daily.     ezetimibe (ZETIA) 10 MG tablet Take 1 tablet (10 mg total) by mouth daily. (Patient not taking: No sig reported) 90 tablet 1   fluticasone (FLONASE) 50 MCG/ACT nasal spray Place 2 sprays into both nostrils daily. 16 g 0   amoxicillin-clavulanate (AUGMENTIN) 875-125 MG tablet Take 1 tablet by mouth 2 (two) times daily. 20 tablet 0   Apixaban (ELIQUIS PO) Take by mouth.     baclofen (LIORESAL) 10 MG tablet Take 10 mg by mouth 2 (two) times daily.     diclofenac (VOLTAREN) 75 MG EC tablet Take 1 tablet  (75 mg total) by mouth daily. 30 tablet 0   gabapentin (NEURONTIN) 300 MG capsule Take 300 mg by mouth 3 (three) times daily.     meloxicam (MOBIC) 15 MG tablet Take 15 mg by mouth daily.     No facility-administered medications prior to visit.    No Known Allergies  ROS Review of Systems  Constitutional: Positive for fatigue. Negative for activity change, appetite change, chills, diaphoresis and fever.  HENT: Negative.   Respiratory: Negative.   Cardiovascular: Negative.   Gastrointestinal: Negative.   Genitourinary: Negative.   Musculoskeletal: Positive for arthralgias, back pain and myalgias. Negative for gait problem, joint swelling, neck pain and neck stiffness.  Skin: Negative.   Neurological: Negative.   Psychiatric/Behavioral: Negative.       Objective:    Physical Exam Vitals reviewed.  Constitutional:      General: She is not in acute distress.    Appearance: Normal appearance. She is not ill-appearing, toxic-appearing or diaphoretic.  HENT:     Head: Normocephalic and atraumatic.     Right Ear: Tympanic membrane normal.     Left Ear: Tympanic membrane normal.     Nose: Nose normal.     Mouth/Throat:     Mouth: Mucous membranes are moist.     Pharynx: No oropharyngeal exudate or posterior oropharyngeal erythema.  Eyes:     General: No scleral icterus.       Right eye: No discharge.        Left eye: No discharge.     Extraocular Movements: Extraocular movements intact.     Conjunctiva/sclera: Conjunctivae normal.     Pupils: Pupils are equal, round, and reactive to light.  Cardiovascular:     Rate and Rhythm: Normal rate and regular rhythm.     Pulses: Normal pulses.     Heart sounds: Normal heart sounds. No murmur heard. No friction rub. No gallop.   Pulmonary:     Effort: Pulmonary effort is normal. No respiratory distress.     Breath sounds: Normal breath sounds. No stridor. No wheezing, rhonchi or rales.  Chest:     Chest wall: No tenderness.   Abdominal:     Palpations: Abdomen is soft.  Musculoskeletal:        General: Normal range of motion.     Cervical back: Normal range of motion and neck supple.  Skin:    General: Skin is warm.     Findings: No erythema.  Neurological:     Mental Status: She is oriented to person, place, and time.  Psychiatric:        Mood and Affect: Mood normal.        Behavior: Behavior normal.        Thought Content: Thought content  normal.        Judgment: Judgment normal.     BP (!) 147/89    Pulse 78    Temp 98 F (36.7 C) (Oral)    Resp 16    Wt 149 lb 3.2 oz (67.7 kg)    SpO2 95%    BMI 27.29 kg/m  Wt Readings from Last 3 Encounters:  07/12/20 149 lb 3.2 oz (67.7 kg)  06/30/20 170 lb (77.1 kg)  05/08/20 142 lb 12.8 oz (64.8 kg)     Health Maintenance Due  Topic Date Due   Hepatitis C Screening  Never done   COVID-19 Vaccine (1) Never done   TETANUS/TDAP  Never done   COLONOSCOPY (Pts 45-10yrs Insurance coverage will need to be confirmed)  Never done   MAMMOGRAM  Never done   DEXA SCAN  Never done   PNA vac Low Risk Adult (1 of 2 - PCV13) Never done   INFLUENZA VACCINE  Never done    There are no preventive care reminders to display for this patient.  Lab Results  Component Value Date   TSH 2.920 05/08/2020   Lab Results  Component Value Date   WBC 9.2 07/01/2020   HGB 12.1 07/01/2020   HCT 36.8 07/01/2020   MCV 91.3 07/01/2020   PLT 296 07/01/2020   Lab Results  Component Value Date   NA 142 07/01/2020   K 3.7 07/01/2020   CO2 22 07/01/2020   GLUCOSE 101 (H) 07/01/2020   BUN 23 07/01/2020   CREATININE 0.95 07/01/2020   BILITOT 0.6 07/01/2020   ALKPHOS 56 07/01/2020   AST 23 07/01/2020   ALT 20 07/01/2020   PROT 6.9 07/01/2020   ALBUMIN 3.8 07/01/2020   CALCIUM 9.2 07/01/2020   ANIONGAP 11 07/01/2020   Lab Results  Component Value Date   CHOL 225 (H) 05/08/2020   Lab Results  Component Value Date   HDL 86 05/08/2020   Lab Results   Component Value Date   LDLCALC 110 (H) 05/08/2020   Lab Results  Component Value Date   TRIG 174 (H) 05/08/2020   No results found for: CHOLHDL No results found for: YKDX8P    Assessment & Plan:   Problem List Items Addressed This Visit      Cardiovascular and Mediastinum   Hypertension - Primary   Relevant Orders   Ambulatory referral to Pain Clinic     Other   Pacemaker   Relevant Medications   ticagrelor (BRILINTA) 60 MG TABS tablet   Other Relevant Orders   Ambulatory referral to Pain Clinic   Anxiety   Relevant Medications   escitalopram (LEXAPRO) 10 MG tablet   Other chronic pain   Relevant Medications   gabapentin (NEURONTIN) 300 MG capsule   diclofenac Sodium (VOLTAREN) 1 % GEL   escitalopram (LEXAPRO) 10 MG tablet   traMADol (ULTRAM) 50 MG tablet   baclofen (LIORESAL) 10 MG tablet   Other Relevant Orders   Ambulatory referral to Pain Clinic   Bipolar disorder (HCC)   Relevant Medications   escitalopram (LEXAPRO) 10 MG tablet   Other Relevant Orders   Ambulatory referral to Pain Clinic   Marijuana use   On anticoagulant therapy   Relevant Orders   Ambulatory referral to Pain Clinic   Nerve pain   Relevant Medications   gabapentin (NEURONTIN) 300 MG capsule   diclofenac Sodium (VOLTAREN) 1 % GEL   escitalopram (LEXAPRO) 10 MG tablet   Other Relevant Orders  Ambulatory referral to Pain Clinic      Meds ordered this encounter  Medications   gabapentin (NEURONTIN) 300 MG capsule    Sig: Take 1 capsule (300 mg total) by mouth 3 (three) times daily.    Dispense:  90 capsule    Refill:  11   ticagrelor (BRILINTA) 60 MG TABS tablet    Sig: Take 1 tablet (60 mg total) by mouth 2 (two) times daily.    Dispense:  60 tablet    Refill:  1   diclofenac Sodium (VOLTAREN) 1 % GEL    Sig: Apply 2 g topically 4 (four) times daily.    Dispense:  350 g    Refill:  0   escitalopram (LEXAPRO) 10 MG tablet    Sig: Take 1 tablet (10 mg total) by mouth  daily.    Dispense:  30 tablet    Refill:  0   traMADol (ULTRAM) 50 MG tablet    Sig: Take 1 tablet (50 mg total) by mouth every 12 (twelve) hours as needed for severe pain.    Dispense:  30 tablet    Refill:  0   baclofen (LIORESAL) 10 MG tablet    Sig: Take 1 tablet (10 mg total) by mouth 2 (two) times daily as needed for muscle spasms.    Dispense:  30 each    Refill:  0   Discussed will not prescribe Tramadol long term she will need to follow up with rheumatologist, orthopedics and pain management.  Discussed at length Brillianta and side effects and NSAIDS and that she has not been on eliquiis she confirms that she has been taking Brillianta 60 mg BID.   Advised to bring all medication bottles to next visit.   Keep follow up with cardiology is advised.   Refer to chronic care management.    Red Flags discussed. The patient was given clear instructions to go to ER or return to medical center if any red flags develop, symptoms do not improve, worsen or new problems develop. They verbalized understanding.  Reviewed PMP aware.    Medications Discontinued During This Encounter  Medication Reason   amoxicillin-clavulanate (AUGMENTIN) 875-125 MG tablet Completed Course   Apixaban (ELIQUIS PO) Completed Course   diclofenac (VOLTAREN) 75 MG EC tablet Completed Course   meloxicam (MOBIC) 15 MG tablet Completed Course   gabapentin (NEURONTIN) 300 MG capsule Reorder   baclofen (LIORESAL) 10 MG tablet Reorder    Follow-up: Return in about 1 week (around 07/19/2020), or if symptoms worsen or fail to improve, for at any time for any worsening symptoms, Go to Emergency room/ urgent care if worse.   The entirety of the information documented in the History of Present Illness, Review of Systems and Physical Exam were personally obtained by me. Portions of this information were initially documented by the CMA and reviewed by me for thoroughness and accuracy.    I spent 55 minutes  dedicated to the care of this patient on  the date of this encounter to include pre-visit review of records, face-to-face time with the patient discussing has Lumbar radiculopathy; Pacemaker; Hyperlipidemia; Chronic bilateral low back pain with bilateral sciatica; Chronic joint pain; Hypertension; Change in vision; Body mass index 26.0-26.9, adult; No-show for appointment; Acute non-recurrent sinusitis; Anxiety; Encounter for medication refill; Moderate episode of recurrent major depressive disorder (HCC); Other chronic pain; Chronic pain syndrome; Right otitis media; Pharyngitis; Bipolar disorder (HCC); Marijuana use; On anticoagulant therapy; and Nerve pain on their problem list.  and post visit ordering of testing.  Red Flags discussed. The patient was given clear instructions to go to ER or return to medical center if any red flags develop, symptoms do not improve, worsen or new problems develop. They verbalized understanding.   Jairo Ben, FNP

## 2020-07-11 NOTE — Telephone Encounter (Signed)
Patient is out of medication Brilinta (pacemaker medicine) medication is not listed on her active medication list, patient also requested tylenol 800/ Ibuprofen 800 for pain,  [id number] [place], [place] [address]  Phone:  [phone] Fax:  [phone]  Appointment was scheduled with Marvell Fuller for 3:20 on Dec 30th 2021  Patient did go to ED yesterday but left before being seen.

## 2020-07-11 NOTE — Telephone Encounter (Signed)
° °  Notes to clinic:  medication filled by historical provider Review for refill    Requested Prescriptions  Pending Prescriptions Disp Refills   gabapentin (NEURONTIN) 300 MG capsule 90 capsule 11    Sig: Take 1 capsule (300 mg total) by mouth 3 (three) times daily.      Neurology: Anticonvulsants - gabapentin Passed - 07/11/2020  5:25 PM      Passed - Valid encounter within last 12 months    Recent Outpatient Visits           1 week ago Pharyngitis, unspecified etiology   [facility] Flinchum, Eula Fried, FNP   4 weeks ago No-show for appointment   [facility] Flinchum, Eula Fried, FNP   1 month ago Hypertension, unspecified type   [facility] Flinchum, Eula Fried, FNP   1 month ago No-show for appointment   [facility] Flinchum, Eula Fried, FNP   1 month ago No-show for appointment   [facility] Flinchum, Eula Fried, FNP       Future Appointments             Tomorrow Flinchum, Eula Fried, FNP [facility]   In 1 month Lanier Prude, MD [facility]

## 2020-07-12 ENCOUNTER — Encounter: Payer: Self-pay | Admitting: Adult Health

## 2020-07-12 ENCOUNTER — Telehealth: Payer: Self-pay | Admitting: Emergency Medicine

## 2020-07-12 ENCOUNTER — Other Ambulatory Visit: Payer: Self-pay

## 2020-07-12 ENCOUNTER — Ambulatory Visit (INDEPENDENT_AMBULATORY_CARE_PROVIDER_SITE_OTHER): Payer: Medicare Other | Admitting: Adult Health

## 2020-07-12 VITALS — BP 147/89 | HR 78 | Temp 98.0°F | Resp 16 | Wt 149.2 lb

## 2020-07-12 DIAGNOSIS — G8929 Other chronic pain: Secondary | ICD-10-CM | POA: Diagnosis not present

## 2020-07-12 DIAGNOSIS — M792 Neuralgia and neuritis, unspecified: Secondary | ICD-10-CM | POA: Insufficient documentation

## 2020-07-12 DIAGNOSIS — F319 Bipolar disorder, unspecified: Secondary | ICD-10-CM | POA: Diagnosis not present

## 2020-07-12 DIAGNOSIS — F419 Anxiety disorder, unspecified: Secondary | ICD-10-CM

## 2020-07-12 DIAGNOSIS — Z7901 Long term (current) use of anticoagulants: Secondary | ICD-10-CM

## 2020-07-12 DIAGNOSIS — Z950 Presence of cardiac pacemaker: Secondary | ICD-10-CM | POA: Diagnosis not present

## 2020-07-12 DIAGNOSIS — I10 Essential (primary) hypertension: Secondary | ICD-10-CM

## 2020-07-12 DIAGNOSIS — F1290 Cannabis use, unspecified, uncomplicated: Secondary | ICD-10-CM

## 2020-07-12 MED ORDER — TRAMADOL HCL 50 MG PO TABS
50.0000 mg | ORAL_TABLET | Freq: Two times a day (BID) | ORAL | 0 refills | Status: DC | PRN
Start: 2020-07-12 — End: 2020-09-14

## 2020-07-12 MED ORDER — TICAGRELOR 60 MG PO TABS
60.0000 mg | ORAL_TABLET | Freq: Two times a day (BID) | ORAL | 1 refills | Status: DC
Start: 2020-07-12 — End: 2020-10-03

## 2020-07-12 MED ORDER — ESCITALOPRAM OXALATE 10 MG PO TABS: 10.0000 mg | ORAL_TABLET | Freq: Every day | ORAL | 0 refills | Status: DC

## 2020-07-12 MED ORDER — BACLOFEN 10 MG PO TABS: 10.0000 mg | ORAL_TABLET | Freq: Two times a day (BID) | ORAL | 0 refills | Status: DC | PRN

## 2020-07-12 MED ORDER — GABAPENTIN 300 MG PO CAPS: 300.0000 mg | ORAL_CAPSULE | Freq: Three times a day (TID) | ORAL | 11 refills | Status: DC

## 2020-07-12 MED ORDER — DICLOFENAC SODIUM 1 % EX GEL: 2.0000 g | Freq: Four times a day (QID) | CUTANEOUS | 0 refills | Status: DC

## 2020-07-12 NOTE — Telephone Encounter (Signed)
[employer] alert received for disconnected monitor for > 21 days. Patient reports she has not had a monitor for "years". Confirmed address to send new monitor. E-mail sent to [facility] to send new monitor.

## 2020-07-12 NOTE — Telephone Encounter (Signed)
FYI patient missed and no showed with her cardiologist. She will need to reschedule, I do see a note on her pacemaker from cardiology.  Can you see If her medical records have come to this office yet  ?  [facility] [address] [place], [place]  [address] Phone:  [phone]   Fax:  [phone]   June 21, 2020     Kari Hughes [address] [place] [address]     Dear Kari Hughes,   We are sorry that you missed your appointment with Dr. Lalla Brothers on 06/20/20.  Your health and follow-up medical care are important to [place].  Please call our office as soon as possible so that we may reschedule your appointment.  If you have already rescheduled your appointment, please disregard this letter.    Please note for all appointments no showed or cancelled without 24 hours notice there will be a $50.00 fee charged directly to you and not your insurance company.  Three or more no shows may result in you being dismissed from our practice.       Sincerely,       [facility]

## 2020-07-12 NOTE — Telephone Encounter (Signed)
You will be seeing patient today at 3:20. KW

## 2020-07-12 NOTE — Patient Instructions (Signed)
Major Depressive Disorder, Adult Major depressive disorder (MDD) is a mental health condition. MDD often makes you feel sad, hopeless, or helpless. MDD can also cause symptoms in your body. MDD can affect your:  Work.  School.  Relationships.  Other normal activities. MDD can range from mild to very bad. It may occur once (single episode MDD). It can also occur many times (recurrent MDD). The main symptoms of MDD often include:  Feeling sad, depressed, or irritable most of the time.  Loss of interest. MDD symptoms also include:  Sleeping too much or too little.  Eating too much or too little.  A change in your weight.  Feeling tired (fatigue) or having low energy.  Feeling worthless.  Feeling guilty.  Trouble making decisions.  Trouble thinking clearly.  Thoughts of suicide or harming others.  Feeling weak.  Feeling agitated.  Keeping yourself from being around other people (isolation). Follow these instructions at home: Activity  Do these things as told by your doctor: ? Go back to your normal activities. ? Exercise regularly. ? Spend time outdoors. Alcohol  Talk with your doctor about how alcohol can affect your antidepressant medicines.  Do not drink alcohol. Or, limit how much alcohol you drink. ? This means no more than 1 drink a day for nonpregnant women and 2 drinks a day for men. One drink equals one of these:  12 oz of beer.  5 oz of wine.  1 oz of hard liquor. General instructions  Take over-the-counter and prescription medicines only as told by your doctor.  Eat a healthy diet.  Get plenty of sleep.  Find activities that you enjoy. Make time to do them.  Think about joining a support group. Your doctor may be able to suggest a group for you.  Keep all follow-up visits as told by your doctor. This is important. Where to find more information:  [employer] on Mental Illness: ? www.nami.org  [employer]  of Mental Health: ? http://www.maynard.net/  National Suicide Prevention Lifeline: ? 818-010-1910. This is free, 24-hour help. Contact a doctor if:  Your symptoms get worse.  You have new symptoms. Get help right away if:  You self-harm.  You see, hear, taste, smell, or feel things that are not present (hallucinate). If you ever feel like you may hurt yourself or others, or have thoughts about taking your own life, get help right away. You can go to your nearest emergency department or call:  Your local emergency services (911 in the [place]  A suicide crisis helpline, such as the National Suicide Prevention Lifeline: ? 770-123-4081. This is open 24 hours a day. This information is not intended to replace advice given to you by your health care provider. Make sure you discuss any questions you have with your health care provider. Document Revised: 06/12/2017 Document Reviewed: 03/16/2016 Elsevier Patient Education  2020 Elsevier Inc. Ticagrelor oral tablet What is this medicine? TICAGRELOR (TYE ka GREL or) helps to prevent blood clots. This medicine is used to prevent heart attack, stroke, or other vascular events in people who have had a recent heart attack or who have severe chest pain. It is also used to lower the chance of stroke or heart attack in people with a medical condition called coronary artery disease. This medicine may be used for other purposes; ask your health care provider or pharmacist if you have questions. COMMON BRAND NAME(S): BRILINTA What should I tell my health care provider before I  take this medicine? They need to know if you have any of these conditions:  bleeding disorders  bleeding in the brain  having surgery  history of irregular heartbeat  history of stomach bleeding  liver disease  an unusual or allergic reaction to ticagrelor, other medicines, foods, dyes, or preservatives  pregnant or trying to get pregnant  breast-feeding How should I  use this medicine? Take this medicine by mouth with a glass of water. Follow the directions on the prescription label. You can take it with or without food. If it upsets your stomach, take it with food. Take your medicine at regular intervals. Do not take it more often than directed. Do not stop taking except on your doctor's advice. A special MedGuide will be given to you by the pharmacist with each prescription and refill. Be sure to read this information carefully each time. Talk to your pediatrician regarding the use of this medicine in children. Special care may be needed. Overdosage: If you think you have taken too much of this medicine contact a poison control center or emergency room at once. NOTE: This medicine is only for you. Do not share this medicine with others. What if I miss a dose? If you miss a dose, take it as soon as you can. If it is almost time for your next dose, take only that dose. Do not take double or extra doses. What may interact with this medicine? Do not take this medicine with any of the following medications:  defibrotide  itraconazole This medicine may also interact with the following medications:  aspirin  certain antibiotics like clarithromycin, telithromycin, and rifampin  certain antiviral medicines for HIV or AIDS like atazanavir, indinavir, nelfinavir, ritonavir, and saquinavir  certain medicines for cholesterol like lovastatin and simvastatin  certain medicines for fungal infections like ketoconazole and voriconazole  certain medicines for seizures like carbamazepine, phenobarbital, and phenytoin  digoxin  narcotic medicines for pain  nefazodone This list may not describe all possible interactions. Give your health care provider a list of all the medicines, herbs, non-prescription drugs, or dietary supplements you use. Also tell them if you smoke, drink alcohol, or use illegal drugs. Some items may interact with your medicine. What should I  watch for while using this medicine? Visit your healthcare professional for regular checks on your progress. Your condition will be monitored carefully while you are receiving this medicine. It is important not to miss any appointments. Notify your doctor or health care professional and seek emergency treatment if you develop breathing problems; changes in vision; chest pain; severe, sudden headache; pain, swelling, warmth in the leg; trouble speaking; sudden numbness or weakness of the face, arm, or leg. These can be signs that your condition has gotten worse. If you are going to have surgery or dental work, tell your doctor or health care professional that you are taking this medicine. You should take aspirin every day with this medicine. Do not take more than 100 mg each day. Talk to your healthcare professional if you have questions. What side effects may I notice from receiving this medicine? Side effects that you should report to your doctor or health care professional as soon as possible:  allergic reactions like skin rash, itching or hives, swelling of the face, lips, or tongue  breathing problems  fast or irregular heartbeat  feeling faint or light-headed, falls  signs and symptoms of bleeding such as bloody or black, tarry stools; red or dark-brown urine; spitting up blood or  brown material that looks like coffee grounds; red spots on the skin; unusual bruising or bleeding from the eye, gums, or nose Side effects that usually do not require medical attention (report to your doctor or health care professional if they continue or are bothersome):  breast enlargement in both males and females  diarrhea  dizziness  headache  tiredness  upset stomach This list may not describe all possible side effects. Call your doctor for medical advice about side effects. You may report side effects to FDA at 1-800-FDA-1088. Where should I keep my medicine? Keep out of the reach of  children. Store at room temperature of 59 to 86 degrees F (15 to 30 degrees C). Throw away any unused medicine after the expiration date. NOTE: This sheet is a summary. It may not cover all possible information. If you have questions about this medicine, talk to your doctor, pharmacist, or health care provider.  2020 Elsevier/Gold Standard (2018-12-13 21:21:41) Escitalopram tablets What is this medicine? ESCITALOPRAM (es sye TAL oh pram) is used to treat depression and certain types of anxiety. This medicine may be used for other purposes; ask your health care provider or pharmacist if you have questions. COMMON BRAND NAME(S): Lexapro What should I tell my health care provider before I take this medicine? They need to know if you have any of these conditions:  bipolar disorder or a family history of bipolar disorder  diabetes  glaucoma  heart disease  kidney or liver disease  receiving electroconvulsive therapy  seizures (convulsions)  suicidal thoughts, plans, or attempt by you or a family member  an unusual or allergic reaction to escitalopram, the related drug citalopram, other medicines, foods, dyes, or preservatives  pregnant or trying to become pregnant  breast-feeding How should I use this medicine? Take this medicine by mouth with a glass of water. Follow the directions on the prescription label. You can take it with or without food. If it upsets your stomach, take it with food. Take your medicine at regular intervals. Do not take it more often than directed. Do not stop taking this medicine suddenly except upon the advice of your doctor. Stopping this medicine too quickly may cause serious side effects or your condition may worsen. A special MedGuide will be given to you by the pharmacist with each prescription and refill. Be sure to read this information carefully each time. Talk to your pediatrician regarding the use of this medicine in children. Special care may be  needed. Overdosage: If you think you have taken too much of this medicine contact a poison control center or emergency room at once. NOTE: This medicine is only for you. Do not share this medicine with others. What if I miss a dose? If you miss a dose, take it as soon as you can. If it is almost time for your next dose, take only that dose. Do not take double or extra doses. What may interact with this medicine? Do not take this medicine with any of the following medications:  certain medicines for fungal infections like fluconazole, itraconazole, ketoconazole, posaconazole, voriconazole  cisapride  citalopram  dronedarone  linezolid  MAOIs like Carbex, Eldepryl, Marplan, Nardil, and Parnate  methylene blue (injected into a vein)  pimozide  thioridazine This medicine may also interact with the following medications:  alcohol  amphetamines  aspirin and aspirin-like medicines  carbamazepine  certain medicines for depression, anxiety, or psychotic disturbances  certain medicines for migraine headache like almotriptan, eletriptan, frovatriptan, naratriptan, rizatriptan, sumatriptan,  zolmitriptan  certain medicines for sleep  certain medicines that treat or prevent blood clots like warfarin, enoxaparin, dalteparin  cimetidine  diuretics  dofetilide  fentanyl  furazolidone  isoniazid  lithium  metoprolol  NSAIDs, medicines for pain and inflammation, like ibuprofen or naproxen  other medicines that prolong the QT interval (cause an abnormal heart rhythm)  procarbazine  rasagiline  supplements like St. John's wort, kava kava, valerian  tramadol  tryptophan  ziprasidone This list may not describe all possible interactions. Give your health care provider a list of all the medicines, herbs, non-prescription drugs, or dietary supplements you use. Also tell them if you smoke, drink alcohol, or use illegal drugs. Some items may interact with your  medicine. What should I watch for while using this medicine? Tell your doctor if your symptoms do not get better or if they get worse. Visit your doctor or health care professional for regular checks on your progress. Because it may take several weeks to see the full effects of this medicine, it is important to continue your treatment as prescribed by your doctor. Patients and their families should watch out for new or worsening thoughts of suicide or depression. Also watch out for sudden changes in feelings such as feeling anxious, agitated, panicky, irritable, hostile, aggressive, impulsive, severely restless, overly excited and hyperactive, or not being able to sleep. If this happens, especially at the beginning of treatment or after a change in dose, call your health care professional. Bonita Quin may get drowsy or dizzy. Do not drive, use machinery, or do anything that needs mental alertness until you know how this medicine affects you. Do not stand or sit up quickly, especially if you are an older patient. This reduces the risk of dizzy or fainting spells. Alcohol may interfere with the effect of this medicine. Avoid alcoholic drinks. Your mouth may get dry. Chewing sugarless gum or sucking hard candy, and drinking plenty of water may help. Contact your doctor if the problem does not go away or is severe. What side effects may I notice from receiving this medicine? Side effects that you should report to your doctor or health care professional as soon as possible:  allergic reactions like skin rash, itching or hives, swelling of the face, lips, or tongue  anxious  black, tarry stools  changes in vision  confusion  elevated mood, decreased need for sleep, racing thoughts, impulsive behavior  eye pain  fast, irregular heartbeat  feeling faint or lightheaded, falls  feeling agitated, angry, or irritable  hallucination, loss of contact with reality  loss of balance or coordination  loss of  memory  painful or prolonged erections  restlessness, pacing, inability to keep still  seizures  stiff muscles  suicidal thoughts or other mood changes  trouble sleeping  unusual bleeding or bruising  unusually weak or tired  vomiting Side effects that usually do not require medical attention (report to your doctor or health care professional if they continue or are bothersome):  changes in appetite  change in sex drive or performance  headache  increased sweating  indigestion, nausea  tremors This list may not describe all possible side effects. Call your doctor for medical advice about side effects. You may report side effects to FDA at 1-800-FDA-1088. Where should I keep my medicine? Keep out of reach of children. Store at room temperature between 15 and 30 degrees C (59 and 86 degrees F). Throw away any unused medicine after the expiration date. NOTE: This sheet is  a summary. It may not cover all possible information. If you have questions about this medicine, talk to your doctor, pharmacist, or health care provider.  2020 Elsevier/Gold Standard (2018-06-21 11:21:44)

## 2020-07-24 ENCOUNTER — Telehealth: Payer: Self-pay

## 2020-07-24 DIAGNOSIS — I10 Essential (primary) hypertension: Secondary | ICD-10-CM

## 2020-07-24 NOTE — Telephone Encounter (Signed)
-----   Message from Berniece Pap, FNP sent at 07/12/2020 11:20 PM EST ----- Please place referral to chronic care management for social work, nursing care Production designer, theatre/television/film and pharmacy to help with patient's care.

## 2020-07-24 NOTE — Telephone Encounter (Signed)
I was trying to find out how to place this order, but when I pull it up I dont believe I have right order,. Please review

## 2020-07-25 NOTE — Telephone Encounter (Signed)
Can you find out if CCM interoffice referral has changed ? I have the same issue? Maybe check with Chrystal Land social worker or Maye Hides in referrals ?

## 2020-07-26 ENCOUNTER — Other Ambulatory Visit: Payer: Self-pay

## 2020-07-26 ENCOUNTER — Telehealth: Payer: Self-pay | Admitting: *Deleted

## 2020-07-26 NOTE — Chronic Care Management (AMB) (Signed)
  Chronic Care Management   Outreach Note  07/26/2020 Name: Kari Hughes MRN: [id number] DOB: 20-Aug-1950  Kari Hughes is a 70 y.o. year old female who is a primary care patient of Flinchum, Eula Fried, FNP. I reached out to [employer] by phone today in response to a referral sent by Kari Hughes's PCP, Flinchum, Eula Fried, FNP.   An unsuccessful telephone outreach was attempted today. The patient was referred to the case management team for assistance with care management and care coordination.   Follow Up Plan: A HIPAA compliant phone message was left for the patient providing contact information and requesting a return call. The care management team will reach out to the patient again over the next 7 days. If patient returns call to provider office, please advise to call Embedded Care Management Care Guide Gwenevere Ghazi at [phone].  Gwenevere Ghazi  Care Guide, Embedded Care Coordination [facility] Management

## 2020-07-31 NOTE — Chronic Care Management (AMB) (Signed)
  Chronic Care Management   Note  07/31/2020 Name: Kari Hughes MRN: [id number] DOB: 1950-08-21  Kari Hughes is a 70 y.o. year old female who is a primary care patient of Flinchum, Kelby Aline, FNP. I reached out to [employer] by phone today in response to a referral sent by Kari Hughes's PCP, Flinchum, Kelby Aline, FNP.   Kari Hughes was given information about Chronic Care Management services today including:  1. CCM service includes personalized support from designated clinical staff supervised by her physician, including individualized plan of care and coordination with other care providers 2. 24/7 contact phone numbers for assistance for urgent and routine care needs. 3. Service will only be billed when office clinical staff spend 20 minutes or more in a month to coordinate care. 4. Only one practitioner may furnish and bill the service in a calendar month. 5. The patient may stop CCM services at any time (effective at the end of the month) by phone call to the office staff. 6. The patient will be responsible for cost sharing (co-pay) of up to 20% of the service fee (after annual deductible is met).  Patient agreed to services and verbal consent obtained.   Follow up plan: Telephone appointment with care management team member scheduled for: PharmD 08/03/2020 Telephone appointment with care management team member scheduled for: [facility] 08/21/2020 Telephone appointment with care management team member scheduled for: SW 08/28/2020  [place] Management

## 2020-08-02 NOTE — Chronic Care Management (AMB) (Deleted)
 Chronic Care Management Pharmacy  Name: Kari Hughes  MRN: [id number] DOB: 03/19/1951   Chief Complaint/ HPI  Kari Hughes,  69 y.o. , female presents for her Initial CCM visit with the clinical pharmacist via telephone.  PCP : Flinchum, Michelle S, FNP Patient Care Team: Flinchum, Michelle S, FNP as PCP - General (Family Medicine) McCray, Felecia, RN as Registered Nurse Land, Chrystal M, LCSW as Social Worker Fleury, Alexandre A, RPH (Pharmacist)  Patient's chronic conditions include: Hypertension, Hyperlipidemia, Depression and Chronic Pain, Bipolar Disorder    Office Visits: 07/12/20: Patient presented to Michelle Flinchum, FNP for follow-up. Patient referred to pain clinic. Voltaren PO stopped, patient started on voltaren gel. Augmentin, apixaban, meloxicam stopped (completed course). Patient started on lexapro 10 mg daily, Brilinta, tramadol.   Consult Visit: 07/10/20: Patient presented to ED for chronic Pain  06/30/20: Patient presented to ED for chronic pain.    Subjective: 05/31/20: Patient presented to Dr. Patel for polyarthralgia consult. Suspect fibromyalgia.  Patient started on gabapentin 300 mg TID, prednisone 10 mg course   Objective: No Known Allergies  Medications: Outpatient Encounter Medications as of 08/03/2020  Medication Sig  . amLODipine (NORVASC) 5 MG tablet Take 1 tablet (5 mg total) by mouth daily.  . atorvastatin (LIPITOR) 10 MG tablet Take 10 mg by mouth daily.  . baclofen (LIORESAL) 10 MG tablet Take 1 tablet (10 mg total) by mouth 2 (two) times daily as needed for muscle spasms.  . diclofenac Sodium (VOLTAREN) 1 % GEL Apply 2 g topically 4 (four) times daily.  . escitalopram (LEXAPRO) 10 MG tablet Take 1 tablet (10 mg total) by mouth daily.  . ezetimibe (ZETIA) 10 MG tablet Take 1 tablet (10 mg total) by mouth daily. (Patient not taking: No sig reported)  . fluticasone (FLONASE) 50 MCG/ACT nasal spray Place 2 sprays into both nostrils  daily.  . gabapentin (NEURONTIN) 300 MG capsule Take 1 capsule (300 mg total) by mouth 3 (three) times daily.  . ticagrelor (BRILINTA) 60 MG TABS tablet Take 1 tablet (60 mg total) by mouth 2 (two) times daily.  . traMADol (ULTRAM) 50 MG tablet Take 1 tablet (50 mg total) by mouth every 12 (twelve) hours as needed for severe pain.   No facility-administered encounter medications on file as of 08/03/2020.    Wt Readings from Last 3 Encounters:  07/12/20 149 lb 3.2 oz (67.7 kg)  06/30/20 170 lb (77.1 kg)  05/08/20 142 lb 12.8 oz (64.8 kg)    Lab Results  Component Value Date   CREATININE 0.95 07/01/2020   BUN 23 07/01/2020   GFRNONAA >60 07/01/2020   GFRAA 73 05/08/2020   NA 142 07/01/2020   K 3.7 07/01/2020   CALCIUM 9.2 07/01/2020   CO2 22 07/01/2020     Current Diagnosis/Assessment:    Goals Addressed   None    Hypertension   BP goal is:  {CHL HP UPSTREAM Pharmacist BP ranges:2109141006}  Office blood pressures are  BP Readings from Last 3 Encounters:  07/12/20 (!) 147/89  07/01/20 (!) 157/87  05/08/20 (!) 157/89   Patient checks BP at home {CHL HP BP Monitoring Frequency:2109141004} Patient home BP readings are ranging: ***  Patient has failed these meds in the past: *** Patient is currently {CHL Controlled/Uncontrolled:2109141014} on the following medications:  . Amlodipine 5 mg daily   We discussed {CHL HP Upstream Pharmacy discussion:2109141007}  Plan  Continue {CHL HP Upstream Pharmacy Plans:2109141003}   Hyperlipidemia   LDL goal < ***    Last lipids Lab Results  Component Value Date   CHOL 225 (H) 05/08/2020   HDL 86 05/08/2020   LDLCALC 110 (H) 05/08/2020   TRIG 174 (H) 05/08/2020   Hepatic Function Latest Ref Rng & Units 07/01/2020 05/08/2020  Total Protein 6.5 - 8.1 g/dL 6.9 7.0  Albumin 3.5 - 5.0 g/dL 3.8 4.6  AST 15 - 41 U/L 23 14  ALT 0 - 44 U/L 20 17  Alk Phosphatase 38 - 126 U/L 56 66  Total Bilirubin 0.3 - 1.2 mg/dL 0.6 0.3      The 10-year ASCVD risk score Mikey Bussing DC Jr., et al., 2013) is: 30.4%   Values used to calculate the score:     Age: 17 years     Sex: Female     Is Non-Hispanic African American: Yes     Diabetic: No     Tobacco smoker: Yes     Systolic Blood Pressure: 643 mmHg     Is BP treated: Yes     HDL Cholesterol: 86 mg/dL     Total Cholesterol: 225 mg/dL   Patient has failed these meds in past: *** Patient is currently {CHL Controlled/Uncontrolled:8144922881} on the following medications:  . Atorvastatin 10 mg daily  . Ezetimibe 10 mg daily (not taking?)  . Brilinta 60 mg twice daily   We discussed:  {CHL HP Upstream Pharmacy [phone]}  Plan  Continue {CHL HP Upstream Pharmacy Plans:[phone]}  Depression / Anxiety   PHQ9 Score:  PHQ9 SCORE ONLY 05/08/2020  PHQ-9 Total Score 8   GAD7 Score: No flowsheet data found.  Patient has failed these meds in past: *** Patient is currently {CHL Controlled/Uncontrolled:8144922881} on the following medications:  . Escitalopram 10 mg daily   We discussed:  ***  Plan  Continue {CHL HP Upstream Pharmacy Plans:[phone]}  Chronic Pain    Patient has failed these meds in past: *** Patient is currently {CHL Controlled/Uncontrolled:8144922881} on the following medications:  . Baclofen 10 mg BID PRN  . Voltaren 1% gel 2g QID  . Gabapentin 300 mg TID  . Tramadol 50 mg twice daily PRN   We discussed:  ***  Plan  Continue {CHL HP Upstream Pharmacy Plans:[phone]}  Misc / OTC   Patient has failed these meds in past: *** Patient is currently {CHL Controlled/Uncontrolled:8144922881} on the following medications:  . Flonase 63mg/act 2 spray daily   We discussed:  ***  Plan  Continue {CHL HP Upstream Pharmacy PPIRJJ:[id number]}  Medication Management   Patient's preferred pharmacy is:  [id number] [id number]: [phone]:  [phone] pill box? {Yes or If no, why not?:20788} Pt endorses ***% compliance  We discussed: {Pharmacy options:24294}  Plan  {US Pharmacy PlBTDV:76160}  Follow up: *** month phone visit  ***

## 2020-08-03 ENCOUNTER — Telehealth: Payer: Medicare Other

## 2020-08-06 ENCOUNTER — Telehealth: Payer: Medicare Other

## 2020-08-06 NOTE — Chronic Care Management (AMB) (Unsigned)
Chronic Care Management Pharmacy  Name: Cyril Railey  MRN: [id number] DOB: 04/02/1951   Chief Complaint/ HPI  Kari Hughes,  70 y.o. , female presents for her Initial CCM visit with the clinical pharmacist via telephone.  PCP : Doreen Beam, FNP Patient Care Team: Doreen Beam, FNP as PCP - General (Family Medicine) Neldon Labella, RN as Registered Nurse Jenita Seashore, Darla Lesches, [place] as Social Worker Germaine Pomfret, [facility] (Pharmacist)  Patient's chronic conditions include: Hypertension, Hyperlipidemia, Depression and Chronic Pain, Bipolar Disorder    Office Visits: 07/12/20: Patient presented to Laverna Peace, FNP for follow-up. Patient referred to pain clinic. Voltaren PO stopped, patient started on voltaren gel. Augmentin, apixaban, meloxicam stopped (completed course). Patient started on lexapro 10 mg daily, Brilinta, tramadol.   Consult Visit: 07/10/20: Patient presented to ED for chronic Pain  06/30/20: Patient presented to ED for chronic pain.    Subjective: 05/31/20: Patient presented to Dr. Posey Pronto for polyarthralgia consult. Suspect fibromyalgia.  Patient started on gabapentin 300 mg TID, prednisone 10 mg course   Objective: No Known Allergies  Medications: Outpatient Encounter Medications as of 08/06/2020  Medication Sig  . amLODipine (NORVASC) 5 MG tablet Take 1 tablet (5 mg total) by mouth daily.  Marland Kitchen atorvastatin (LIPITOR) 10 MG tablet Take 10 mg by mouth daily.  . baclofen (LIORESAL) 10 MG tablet Take 1 tablet (10 mg total) by mouth 2 (two) times daily as needed for muscle spasms.  . diclofenac Sodium (VOLTAREN) 1 % GEL Apply 2 g topically 4 (four) times daily.  Marland Kitchen escitalopram (LEXAPRO) 10 MG tablet Take 1 tablet (10 mg total) by mouth daily.  Marland Kitchen ezetimibe (ZETIA) 10 MG tablet Take 1 tablet (10 mg total) by mouth daily. (Patient not taking: No sig reported)  . fluticasone (FLONASE) 50 MCG/ACT nasal spray Place 2 sprays into both nostrils  daily.  Marland Kitchen gabapentin (NEURONTIN) 300 MG capsule Take 1 capsule (300 mg total) by mouth 3 (three) times daily.  . ticagrelor (BRILINTA) 60 MG TABS tablet Take 1 tablet (60 mg total) by mouth 2 (two) times daily.  . traMADol (ULTRAM) 50 MG tablet Take 1 tablet (50 mg total) by mouth every 12 (twelve) hours as needed for severe pain.   No facility-administered encounter medications on file as of 08/06/2020.    Wt Readings from Last 3 Encounters:  07/12/20 149 lb 3.2 oz (67.7 kg)  06/30/20 170 lb (77.1 kg)  05/08/20 142 lb 12.8 oz (64.8 kg)    Lab Results  Component Value Date   CREATININE 0.95 07/01/2020   BUN 23 07/01/2020   GFRNONAA >60 07/01/2020   GFRAA 73 05/08/2020   NA 142 07/01/2020   K 3.7 07/01/2020   CALCIUM 9.2 07/01/2020   CO2 22 07/01/2020     Current Diagnosis/Assessment:    Goals Addressed   None    Hypertension   BP goal is:  {CHL HP UPSTREAM Pharmacist BP [phone]}  Office blood pressures are  BP Readings from Last 3 Encounters:  07/12/20 (!) 147/89  07/01/20 (!) 157/87  05/08/20 (!) 157/89   Patient checks BP at home {CHL HP BP Monitoring [phone]} Patient home BP readings are ranging: ***  Patient has failed these meds in the past: *** Patient is currently {CHL Controlled/[phone]} on the following medications:  . Amlodipine 5 mg daily   We discussed {CHL HP Upstream Pharmacy discussion:[phone]}  Plan  Continue {CHL HP Upstream Pharmacy Plans:3394340417}   Hyperlipidemia   LDL goal < ***  Last lipids Lab Results  Component Value Date   CHOL 225 (H) 05/08/2020   HDL 86 05/08/2020   LDLCALC 110 (H) 05/08/2020   TRIG 174 (H) 05/08/2020   Hepatic Function Latest Ref Rng & Units 07/01/2020 05/08/2020  Total Protein 6.5 - 8.1 g/dL 6.9 7.0  Albumin 3.5 - 5.0 g/dL 3.8 4.6  AST 15 - 41 U/L 23 14  ALT 0 - 44 U/L 20 17  Alk Phosphatase 38 - 126 U/L 56 66  Total Bilirubin 0.3 - 1.2 mg/dL 0.6 0.3      The 10-year ASCVD risk score Mikey Bussing DC Jr., et al., 2013) is: 30.4%   Values used to calculate the score:     Age: 84 years     Sex: Female     Is Non-Hispanic African American: Yes     Diabetic: No     Tobacco smoker: Yes     Systolic Blood Pressure: 841 mmHg     Is BP treated: Yes     HDL Cholesterol: 86 mg/dL     Total Cholesterol: 225 mg/dL   Patient has failed these meds in past: *** Patient is currently {CHL Controlled/[phone]} on the following medications:  . Atorvastatin 10 mg daily  . Ezetimibe 10 mg daily (not taking?)  . Brilinta 60 mg twice daily   We discussed:  {CHL HP Upstream Pharmacy discussion:7692365508}  Plan  Continue {CHL HP Upstream Pharmacy Plans:[phone]}  Depression / Anxiety   PHQ9 Score:  PHQ9 SCORE ONLY 05/08/2020  PHQ-9 Total Score 8   GAD7 Score: No flowsheet data found.  Patient has failed these meds in past: *** Patient is currently {CHL Controlled/[phone]} on the following medications:  . Escitalopram 10 mg daily   We discussed:  ***  Plan  Continue {CHL HP Upstream Pharmacy Plans:[phone]}  Chronic Pain    Patient has failed these meds in past: *** Patient is currently {CHL Controlled/[phone]} on the following medications:  . Baclofen 10 mg BID PRN  . Voltaren 1% gel 2g QID  . Gabapentin 300 mg TID  . Tramadol 50 mg twice daily PRN   We discussed:  ***  Plan  Continue {CHL HP Upstream Pharmacy Plans:[phone]}  Misc / OTC   Patient has failed these meds in past: *** Patient is currently {CHL Controlled/[phone]} on the following medications:  . Flonase 63mg/act 2 spray daily   We discussed:  ***  Plan  Continue {CHL HP Upstream Pharmacy PLKGMW:1027253664}  Medication Management   Patient's [facility]  [id number] [place]: [phone]:  [phone] pill box? {Yes or If no, why not?:20788} Pt endorses ***% compliance  We discussed: {Pharmacy options:24294}  Plan  {US Pharmacy PlAYTK:16010}  Follow up: *** month phone visit  ***

## 2020-08-07 ENCOUNTER — Telehealth: Payer: Medicare Other

## 2020-08-07 NOTE — Chronic Care Management (AMB) (Unsigned)
Chronic Care Management Pharmacy  Name: Kari Hughes  MRN: [id number] DOB: 03/27/1951   Chief Complaint/ HPI  Kari Hughes,  70 y.o. , female presents for her Initial CCM visit with the clinical pharmacist via telephone.  PCP : Doreen Beam, FNP Patient Care Team: Doreen Beam, FNP as PCP - General (Family Medicine) Neldon Labella, RN as Registered Nurse Jenita Seashore, Darla Lesches, [place] as Social Worker Germaine Pomfret, [facility] (Pharmacist)  Patient's chronic conditions include: Hypertension, Hyperlipidemia, Depression and Chronic Pain, Bipolar Disorder    Office Visits: 07/12/20: Patient presented to Laverna Peace, FNP for follow-up. Patient referred to pain clinic. Voltaren PO stopped, patient started on voltaren gel. Augmentin, apixaban, meloxicam stopped (completed course). Patient started on lexapro 10 mg daily, Brilinta, tramadol.   Consult Visit: 07/10/20: Patient presented to ED for chronic Pain  06/30/20: Patient presented to ED for chronic pain.    Subjective: 05/31/20: Patient presented to Dr. Posey Pronto for polyarthralgia consult. Suspect fibromyalgia.  Patient started on gabapentin 300 mg TID, prednisone 10 mg course   Objective: No Known Allergies  Medications: Outpatient Encounter Medications as of 08/07/2020  Medication Sig  . amLODipine (NORVASC) 5 MG tablet Take 1 tablet (5 mg total) by mouth daily.  Marland Kitchen atorvastatin (LIPITOR) 10 MG tablet Take 10 mg by mouth daily.  . baclofen (LIORESAL) 10 MG tablet Take 1 tablet (10 mg total) by mouth 2 (two) times daily as needed for muscle spasms.  . diclofenac Sodium (VOLTAREN) 1 % GEL Apply 2 g topically 4 (four) times daily.  Marland Kitchen escitalopram (LEXAPRO) 10 MG tablet Take 1 tablet (10 mg total) by mouth daily.  Marland Kitchen ezetimibe (ZETIA) 10 MG tablet Take 1 tablet (10 mg total) by mouth daily. (Patient not taking: No sig reported)  . fluticasone (FLONASE) 50 MCG/ACT nasal spray Place 2 sprays into both nostrils  daily.  Marland Kitchen gabapentin (NEURONTIN) 300 MG capsule Take 1 capsule (300 mg total) by mouth 3 (three) times daily.  . ticagrelor (BRILINTA) 60 MG TABS tablet Take 1 tablet (60 mg total) by mouth 2 (two) times daily.  . traMADol (ULTRAM) 50 MG tablet Take 1 tablet (50 mg total) by mouth every 12 (twelve) hours as needed for severe pain.   No facility-administered encounter medications on file as of 08/07/2020.    Wt Readings from Last 3 Encounters:  07/12/20 149 lb 3.2 oz (67.7 kg)  06/30/20 170 lb (77.1 kg)  05/08/20 142 lb 12.8 oz (64.8 kg)    Lab Results  Component Value Date   CREATININE 0.95 07/01/2020   BUN 23 07/01/2020   GFRNONAA >60 07/01/2020   GFRAA 73 05/08/2020   NA 142 07/01/2020   K 3.7 07/01/2020   CALCIUM 9.2 07/01/2020   CO2 22 07/01/2020     Current Diagnosis/Assessment:    Goals Addressed   None    Hypertension   BP goal is:  {CHL HP UPSTREAM Pharmacist BP [phone]}  Office blood pressures are  BP Readings from Last 3 Encounters:  07/12/20 (!) 147/89  07/01/20 (!) 157/87  05/08/20 (!) 157/89   Patient checks BP at home {CHL HP BP Monitoring Frequency:[id number]} Patient home BP readings are ranging: ***  Patient has failed these meds in the past: *** Patient is currently {CHL Controlled/Uncontrolled:[phone]} on the following medications:  . Amlodipine 5 mg daily   We discussed {CHL HP Upstream Pharmacy [phone]}  Plan  Continue {CHL HP Upstream Pharmacy Plans:7540996415}   Hyperlipidemia   LDL goal < ***  Last lipids Lab Results  Component Value Date   CHOL 225 (H) 05/08/2020   HDL 86 05/08/2020   LDLCALC 110 (H) 05/08/2020   TRIG 174 (H) 05/08/2020   Hepatic Function Latest Ref Rng & Units 07/01/2020 05/08/2020  Total Protein 6.5 - 8.1 g/dL 6.9 7.0  Albumin 3.5 - 5.0 g/dL 3.8 4.6  AST 15 - 41 U/L 23 14  ALT 0 - 44 U/L 20 17  Alk Phosphatase 38 - 126 U/L 56 66  Total Bilirubin 0.3 - 1.2 mg/dL 0.6 0.3      The 10-year ASCVD risk score Mikey Bussing DC Jr., et al., 2013) is: 30.4%   Values used to calculate the score:     Age: 84 years     Sex: Female     Is Non-Hispanic African American: Yes     Diabetic: No     Tobacco smoker: Yes     Systolic Blood Pressure: 841 mmHg     Is BP treated: Yes     HDL Cholesterol: 86 mg/dL     Total Cholesterol: 225 mg/dL   Patient has failed these meds in past: *** Patient is currently {CHL Controlled/[phone]} on the following medications:  . Atorvastatin 10 mg daily  . Ezetimibe 10 mg daily (not taking?)  . Brilinta 60 mg twice daily   We discussed:  {CHL HP Upstream Pharmacy discussion:7692365508}  Plan  Continue {CHL HP Upstream Pharmacy Plans:[phone]}  Depression / Anxiety   PHQ9 Score:  PHQ9 SCORE ONLY 05/08/2020  PHQ-9 Total Score 8   GAD7 Score: No flowsheet data found.  Patient has failed these meds in past: *** Patient is currently {CHL Controlled/[phone]} on the following medications:  . Escitalopram 10 mg daily   We discussed:  ***  Plan  Continue {CHL HP Upstream Pharmacy Plans:[phone]}  Chronic Pain    Patient has failed these meds in past: *** Patient is currently {CHL Controlled/[phone]} on the following medications:  . Baclofen 10 mg BID PRN  . Voltaren 1% gel 2g QID  . Gabapentin 300 mg TID  . Tramadol 50 mg twice daily PRN   We discussed:  ***  Plan  Continue {CHL HP Upstream Pharmacy Plans:[phone]}  Misc / OTC   Patient has failed these meds in past: *** Patient is currently {CHL Controlled/[phone]} on the following medications:  . Flonase 63mg/act 2 spray daily   We discussed:  ***  Plan  Continue {CHL HP Upstream Pharmacy PLKGMW:1027253664}  Medication Management   Patient's [facility]  [id number] [place]: [phone]:  [phone] pill box? {Yes or If no, why not?:20788} Pt endorses ***% compliance  We discussed: {Pharmacy options:24294}  Plan  {US Pharmacy PlAYTK:16010}  Follow up: *** month phone visit  ***

## 2020-08-14 ENCOUNTER — Encounter: Payer: Self-pay | Admitting: Adult Health

## 2020-08-14 ENCOUNTER — Ambulatory Visit (INDEPENDENT_AMBULATORY_CARE_PROVIDER_SITE_OTHER): Payer: Medicare Other | Admitting: Adult Health

## 2020-08-14 DIAGNOSIS — Z5329 Procedure and treatment not carried out because of patient's decision for other reasons: Secondary | ICD-10-CM

## 2020-08-14 NOTE — Progress Notes (Signed)
Discharge letter created to mail, NO Show over 3 times to scheduled appointments.

## 2020-08-14 NOTE — Progress Notes (Signed)
No show

## 2020-08-15 ENCOUNTER — Encounter: Payer: Medicaid Other | Admitting: Cardiology

## 2020-08-15 ENCOUNTER — Telehealth: Payer: Self-pay | Admitting: Cardiology

## 2020-08-15 NOTE — Telephone Encounter (Signed)
The patient was a no show for her appointment today with Dr. Lalla Brothers.   Attempted to call the patient to try to reschedule to Friday 08/17/20. No answer- unable to leave a message at her preferred number.   I called her alternate number, which is for her sister, Coralee North. I advised I was calling due to a missed appointment in our office today with Dr. Lalla Brothers. Per Coralee North, she schedules the patient's appointments and was not aware of the appointment today.  I advised we could reschedule her to this Friday 2/4 with Dr. Lalla Brothers. Per Coralee North, the patient is out of town visiting her daughter and she may not be back by then.  I have offered next Wednesday 2/9 at 3:00 pm. Coralee North advised this appointment will work. I have reviewed the location of our office, to please arrive 15 minutes early and enter through the [facility] entrance.  Coralee North voices understanding and is agreeable.

## 2020-08-20 ENCOUNTER — Encounter: Payer: Self-pay | Admitting: Gastroenterology

## 2020-08-20 ENCOUNTER — Ambulatory Visit: Payer: Medicare Other | Admitting: Gastroenterology

## 2020-08-21 ENCOUNTER — Ambulatory Visit: Payer: Medicare Other

## 2020-08-22 ENCOUNTER — Encounter: Payer: Medicaid Other | Admitting: Cardiology

## 2020-08-23 ENCOUNTER — Encounter: Payer: Self-pay | Admitting: Cardiology

## 2020-08-23 NOTE — Chronic Care Management (AMB) (Signed)
  Chronic Care Management   Outreach Note   Name: Kari Hughes MRN: [id number] DOB: 1950/12/29  Primary Care Provider: Berniece Pap, FNP Reason for referral : Chronic Care Management  Kari Hughes was referred to the case management team for assistance with care management and care coordination. I was able to successfully connect with her. Reports currently being in [place] and planning to return to [place] next week.   We discussed the recent referral for Nursing, Social work and IT consultant.  She declined need for nursing outreach but expressed concerns regarding her medications. Indicated that her medications were removed from the issued pill bottles and she is unable to identify the pills. Concerned that she is not taking her medications correctly.  Requested to speak with the Pharmacist when she returns to discuss options.     Follow Up Plan:  Kari Hughes will call the clinic when she returns to [facility]. Will update the care management team.    Katha Cabal [facility]/Care Management [facility] [phone]

## 2020-08-24 ENCOUNTER — Telehealth: Payer: Medicare Other

## 2020-08-28 ENCOUNTER — Telehealth: Payer: Self-pay | Admitting: *Deleted

## 2020-08-28 ENCOUNTER — Telehealth: Payer: Medicare Other | Admitting: *Deleted

## 2020-08-28 NOTE — Telephone Encounter (Signed)
   Chronic Care Management   Unsuccessful Call Note 08/28/2020 Name: Clorissa Gruenberg MRN: [id number] DOB: Aug 15, 1950  Alonie Gazzola is a 70 year old female who sees Marvell Fuller, FNP for primary care. Kathalene Frames, FNP asked the CCM team to consult the patient for Counseling and Mental [facility].      This social worker was unable to reach patient via telephone today for initial assessment. I have left HIPAA compliant voicemail asking patient to return my call. (unsuccessful outreach #1).   Plan: Will follow-up within 7 business days via telephone.     Verna Czech, LCSW Clinical Social Worker  [facility] Care Management [phone]

## 2020-09-04 ENCOUNTER — Ambulatory Visit: Payer: Self-pay | Admitting: *Deleted

## 2020-09-04 NOTE — Chronic Care Management (AMB) (Cosign Needed)
Chronic Care Management    Clinical Social Work Note  09/04/2020 Name: Kari Hughes MRN: [id number] DOB: September 18, 1950  Kari Hughes is a 70 y.o. year old female who is a primary care patient of Flinchum, Eula Fried, FNP. The CCM team was consulted to assist the patient with chronic disease management and/or care coordination needs related to: Mental Health Counseling and Resources.   Engaged with patient by telephone for initial visit in response to provider referral for social work chronic care management and care coordination services.   Patient confirmed that she is still in [place] with her daughter. Per patient, she is trying to get home but is waiting on a replacement debit care. Until then, she has no access to her money. Patient discussed feeling mentally uncomfortable, racing thoughts, fearful. Patient denied thoughts of self harm or harm to others, however shared feelings of anxiety and depression. This social explored coping strategies to be utilized during this time-walking, talking to her sister, prayer. This social researched crisis response centers in her town. Contact number to the [facility] [phone] provided to patient in the event she feels at risk for self harm or harm to others.  Per patient , she has utilized this service in the past and will again if needed.   Patient agreed to services and consent obtained.   Assessment: Review of patient past medical history, allergies, medications, and health status, including review of relevant consultants reports was performed today as part of a comprehensive evaluation and provision of chronic care management and care coordination services.     SDOH (Social Determinants of Health) assessments and interventions performed:    Advanced Directives Status: Not addressed in this encounter.  CCM Care Plan  No Known Allergies  Outpatient Encounter Medications as of 09/04/2020  Medication Sig  .  amLODipine (NORVASC) 5 MG tablet Take 1 tablet (5 mg total) by mouth daily.  Marland Kitchen atorvastatin (LIPITOR) 10 MG tablet Take 10 mg by mouth daily.  . baclofen (LIORESAL) 10 MG tablet Take 1 tablet (10 mg total) by mouth 2 (two) times daily as needed for muscle spasms.  . diclofenac Sodium (VOLTAREN) 1 % GEL Apply 2 g topically 4 (four) times daily.  Marland Kitchen escitalopram (LEXAPRO) 10 MG tablet Take 1 tablet (10 mg total) by mouth daily.  Marland Kitchen ezetimibe (ZETIA) 10 MG tablet Take 1 tablet (10 mg total) by mouth daily. (Patient not taking: No sig reported)  . fluticasone (FLONASE) 50 MCG/ACT nasal spray Place 2 sprays into both nostrils daily.  Marland Kitchen gabapentin (NEURONTIN) 300 MG capsule Take 1 capsule (300 mg total) by mouth 3 (three) times daily.  . ticagrelor (BRILINTA) 60 MG TABS tablet Take 1 tablet (60 mg total) by mouth 2 (two) times daily.  . traMADol (ULTRAM) 50 MG tablet Take 1 tablet (50 mg total) by mouth every 12 (twelve) hours as needed for severe pain.   No facility-administered encounter medications on file as of 09/04/2020.    Patient Active Problem List   Diagnosis Date Noted  . Bipolar disorder (HCC) 07/12/2020  . Marijuana use 07/12/2020  . On anticoagulant therapy 07/12/2020  . Nerve pain 07/12/2020  . Right otitis media 07/03/2020  . Pharyngitis 07/03/2020  . Moderate episode of recurrent major depressive disorder (HCC)   . Other chronic pain   . Chronic pain syndrome   . Acute non-recurrent sinusitis 06/01/2020  . Anxiety 06/01/2020  . Encounter for medication refill 06/01/2020  . No-show for appointment 05/28/2020  . Chronic  bilateral low back pain with bilateral sciatica 05/11/2020  . Chronic joint pain 05/11/2020  . Hypertension 05/11/2020  . Change in vision 05/11/2020  . Body mass index 26.0-26.9, adult 05/11/2020  . Hyperlipidemia 05/10/2020  . Pacemaker 05/08/2020  . Lumbar radiculopathy 03/30/2020    Conditions to be addressed/monitored: ; Mental Health Concerns    There are no care plans that you recently modified to display for this patient.    Follow Up Plan: Patient to call her providers office upon her return to [place].       Verna Czech, LCSW Clinical Social Worker  [facility] Care Management [phone]

## 2020-09-10 ENCOUNTER — Telehealth: Payer: Self-pay | Admitting: Adult Health

## 2020-09-10 NOTE — Telephone Encounter (Signed)
Yes patient has been non compliant with care  CCM has been referred However please not patient has been mailed discharge letter due to office no show policy and we can only provide care from thirty days of that letter.  She has to have DPR as well and will need to find need to find new PCP

## 2020-09-10 NOTE — Telephone Encounter (Signed)
Pt sister is calling to ask what medications the patient is taking? Pt spilled all of her medication in a zip lock bag. And reach out the pharmacy. And the pharmacy is not being helpful as to what medications the pt should be taking with the sig.  Please advise CB- [phone]

## 2020-09-10 NOTE — Telephone Encounter (Signed)
Patient does not have DPR listed on file, patient states that she has fibromyalgia and shakes, she reports difficulty holding medication and states that they had fell out when opening bottles. Patient ask that we communicate with her sister in regards to medication she is on because she is unable to write them down . Patient ask that we contact her sister Kari Hughes at [phone], patient was advised for any future medical information it will not be disclosed to any party unless she has a DPR listed on file. Patient understood.I called and spoke with sister and she states that patient had took her medication bottles and emptied them all into a ziplock bag. Sister Kari Hughes) took bag back to pharmacy to have them put back in proper bottles but she states that pharmacist told her description of pills was on bottle and pill and she should be able to do it herself. Went over medication list and how often medication is given, she was instructed to return with patient at next visit to have DPR filled out and signed before any future calls can continue. KW

## 2020-09-11 ENCOUNTER — Encounter: Payer: Self-pay | Admitting: Adult Health

## 2020-09-11 ENCOUNTER — Other Ambulatory Visit: Payer: Self-pay

## 2020-09-11 ENCOUNTER — Ambulatory Visit (INDEPENDENT_AMBULATORY_CARE_PROVIDER_SITE_OTHER): Payer: 59 | Admitting: Adult Health

## 2020-09-11 DIAGNOSIS — M2550 Pain in unspecified joint: Secondary | ICD-10-CM

## 2020-09-11 DIAGNOSIS — F419 Anxiety disorder, unspecified: Secondary | ICD-10-CM | POA: Diagnosis not present

## 2020-09-11 DIAGNOSIS — G8929 Other chronic pain: Secondary | ICD-10-CM | POA: Diagnosis not present

## 2020-09-11 DIAGNOSIS — Z950 Presence of cardiac pacemaker: Secondary | ICD-10-CM | POA: Diagnosis not present

## 2020-09-11 DIAGNOSIS — F319 Bipolar disorder, unspecified: Secondary | ICD-10-CM

## 2020-09-11 DIAGNOSIS — I10 Essential (primary) hypertension: Secondary | ICD-10-CM

## 2020-09-11 DIAGNOSIS — E785 Hyperlipidemia, unspecified: Secondary | ICD-10-CM

## 2020-09-11 NOTE — Patient Instructions (Signed)
Establish care with a new primary care provider as soon as possible as per letter on 08/14/2020. Office policy and patient is discharged as of 09/12/2020. Will give courtesy refill of chronic medications and no more than 30 days. Needs to establish for care and repeat labs and vital signs as soon as possible.    Psychiatric/Counseling Resources Discussed As Follows:  If Emergency please seek Emergency Room Care Immediately or Call 911.   [facility] Address:  [address] [place], [place] [address] Phone: [phone] Website : [email or web address]   [facility] Address:  [address] [place], [place] [address] Phone: [phone] Fax: [phone] Website: [email or web address] How To Access Our Services Because our main goal is to meet the needs of our consumers, [facility] operates on a walk-in basis! To access services, there are just 3 easy steps: 1) Walk in any Monday, Wednesday or Friday between 8:00 am and 3:00 pm and complete our consumer paperwork 2) A Comprehensive Clinical Assessment (CCA) will be completed and appropriate service recommendations will be provided 3) Recommendations are sent to [facility] team members and the appropriate staff will call you within days. Advanced Access Open M - F, 8:00 am - 8:00 pm  Mental health crisis services for all age groups  Triage  Psychiatric Evaluations  Involuntary Commitments  [facility]  Address: [address] [place], [place] [address] Website : [email or web address] Walk in's accepted see web site or call for more information Phone : [phone] Also has [facility] [phone]    Psychology Today Find a therapist by searching online in your area or specialist by your diagnosis Website:  https://www.psychologytoday.com/us       [email or web address]">  Generalized Anxiety Disorder, Adult Generalized anxiety  disorder (GAD) is a mental health condition. Unlike normal worries, anxiety related to GAD is not triggered by a specific event. These worries do not fade or get better with time. GAD interferes with relationships, work, and school. GAD symptoms can vary from mild to severe. People with severe GAD can have intense waves of anxiety with physical symptoms that are similar to panic attacks. What are the causes? The exact cause of GAD is not known, but the following are believed to have an impact:  Differences in natural brain chemicals.  Genes passed down from parents to children.  Differences in the way threats are perceived.  Development during childhood.  Personality. What increases the risk? The following factors may make you more likely to develop this condition:  Being female.  Having a family history of anxiety disorders.  Being very shy.  Experiencing very stressful life events, such as the death of a loved one.  Having a very stressful family environment. What are the signs or symptoms? People with GAD often worry excessively about many things in their lives, such as their health and family. Symptoms may also include:  Mental and emotional symptoms: ? Worrying excessively about natural disasters. ? Fear of being late. ? Difficulty concentrating. ? Fears that others are judging your performance.  Physical symptoms: ? Fatigue. ? Headaches, muscle tension, muscle twitches, trembling, or feeling shaky. ? Feeling like your heart is pounding or beating very fast. ? Feeling out of breath or like you cannot take a deep breath. ? Having trouble falling asleep or staying asleep, or experiencing restlessness. ? Sweating. ? Nausea, diarrhea, or irritable bowel syndrome (IBS).  Behavioral symptoms: ? Experiencing erratic moods or irritability. ? Avoidance of new situations. ? Avoidance of  people. ? Extreme difficulty making decisions. How is this diagnosed? This condition  is diagnosed based on your symptoms and medical history. You will also have a physical exam. Your health care provider may perform tests to rule out other possible causes of your symptoms. To be diagnosed with GAD, a person must have anxiety that:  Is out of his or her control.  Affects several different aspects of his or her life, such as work and relationships.  Causes distress that makes him or her unable to take part in normal activities.  Includes at least three symptoms of GAD, such as restlessness, fatigue, trouble concentrating, irritability, muscle tension, or sleep problems. Before your health care provider can confirm a diagnosis of GAD, these symptoms must be present more days than they are not, and they must last for 6 months or longer. How is this treated? This condition may be treated with:  Medicine. Antidepressant medicine is usually prescribed for long-term daily control. Anti-anxiety medicines may be added in severe cases, especially when panic attacks occur.  Talk therapy (psychotherapy). Certain types of talk therapy can be helpful in treating GAD by providing support, education, and guidance. Options include: ? Cognitive behavioral therapy (CBT). People learn coping skills and self-calming techniques to ease their physical symptoms. They learn to identify unrealistic thoughts and behaviors and to replace them with more appropriate thoughts and behaviors. ? Acceptance and commitment therapy (ACT). This treatment teaches people how to be mindful as a way to cope with unwanted thoughts and feelings. ? Biofeedback. This process trains you to manage your body's response (physiological response) through breathing techniques and relaxation methods. You will work with a therapist while machines are used to monitor your physical symptoms.  Stress management techniques. These include yoga, meditation, and exercise. A mental health specialist can help determine which treatment is  best for you. Some people see improvement with one type of therapy. However, other people require a combination of therapies.   Follow these instructions at home: Lifestyle  Maintain a consistent routine and schedule.  Anticipate stressful situations. Create a plan, and allow extra time to work with your plan.  Practice stress management or self-calming techniques that you have learned from your therapist or your health care provider. General instructions  Take over-the-counter and prescription medicines only as told by your health care provider.  Understand that you are likely to have setbacks. Accept this and be kind to yourself as you persist to take better care of yourself.  Recognize and accept your accomplishments, even if you judge them as small.  Keep all follow-up visits as told by your health care provider. This is important. Contact a health care provider if:  Your symptoms do not get better.  Your symptoms get worse.  You have signs of depression, such as: ? A persistently sad or irritable mood. ? Loss of enjoyment in activities that used to bring you joy. ? Change in weight or eating. ? Changes in sleeping habits. ? Avoiding friends or family members. ? Loss of energy for normal tasks. ? Feelings of guilt or worthlessness. Get help right away if:  You have serious thoughts about hurting yourself or others. If you ever feel like you may hurt yourself or others, or have thoughts about taking your own life, get help right away. Go to your nearest emergency department or:  Call your local emergency services (911 in the [place].S[place]  Call a suicide crisis helpline, such as the National Suicide Prevention Lifeline at 270-165-6370.  This is open 24 hours a day in the [place]  Text the Crisis Text Line at (347)136-8762 (in the U.S.). Summary  Generalized anxiety disorder (GAD) is a mental health condition that involves worry that is not triggered by a specific event.  People with  GAD often worry excessively about many things in their lives, such as their health and family.  GAD may cause symptoms such as restlessness, trouble concentrating, sleep problems, frequent sweating, nausea, diarrhea, headaches, and trembling or muscle twitching.  A mental health specialist can help determine which treatment is best for you. Some people see improvement with one type of therapy. However, other people require a combination of therapies. This information is not intended to replace advice given to you by your health care provider. Make sure you discuss any questions you have with your health care provider. Document Revised: 04/20/2019 Document Reviewed: 04/20/2019 Elsevier Patient Education  2021 [employer].

## 2020-09-11 NOTE — Progress Notes (Signed)
Virtual telephone visit    Virtual Visit via Telephone Note   This visit type was conducted due to national recommendations for restrictions regarding the COVID-19 Pandemic (e.g. social distancing) in an effort to limit this patient's exposure and mitigate transmission in our community. Due to her co-morbid illnesses, this patient is at least at moderate risk for complications without adequate follow up. This format is felt to be most appropriate for this patient at this time. The patient did not have access to video technology or had technical difficulties with video requiring transitioning to audio format only (telephone). Physical exam was limited to content and character of the telephone converstion.    Parties involved in visit as below:   Patient location:   Provider location: Provider: Provider's office at  [facility], [place].     I discussed the limitations of evaluation and management by telemedicine and the availability of in person appointments. The patient expressed understanding and agreed to proceed.   Visit Date: 09/11/2020  Today's healthcare provider: Jairo Ben, FNP  Discharged letter 08/14/20 was mailed t patient for office policy of no show appointments.  Chief Complaint  Patient presents with  . Anxiety   Subjective    HPI  Anxiety, Follow-up  She was last seen for anxiety 3 months ago. Changes made at last visit include none continue Lexapro 10mg .   She reports fair compliance with treatment. Patient stopped medication due to confusion of medication. She reports she is not sleeping. She feels nervous.  She is still complaining of chronic pain and she has been seeing rheumatology. She reports her anxiety is worsened.  She has been referred to pain medication Dr. .  She reports fair tolerance of treatment. She is not having side effects.   She feels her anxiety is severe and Worse since last visit.  She has not  seen cardiology either and was referred.   Has been referred to psychiatry in 05/2020. Diagnosis Information  Diagnosis  F41.9 (ICD-10-CM) - Anxiety   Referral Notes Number of Notes: 06/2020 Type Date User Summary Attachment  Specialty Comments 08/20/2020 2:16 PM 10/18/2020, CMA - -  Note   Spoke with patient and she states that she is out of state with her family and she not feeling well and that she call when she gets back.           Patient  denies any fever, body aches,chills, rash, chest pain, shortness of breath, nausea, vomiting, or diarrhea.  Denies dizziness, lightheadedness, pre syncopal or syncopal episodes.    Symptoms: No chest pain Yes difficulty concentrating  Yes dizziness Yes fatigue  Yes feelings of losing control Yes insomnia  Yes irritable No palpitations  Yes panic attacks Yes racing thoughts  No shortness of breath No sweating  No tremors/shakes    GAD-7 Results No flowsheet data found.  PHQ-9 Scores PHQ9 SCORE ONLY 05/08/2020  PHQ-9 Total Score 8    ---------------------------------------------------------------------------------------------------    Patient Active Problem List   Diagnosis Date Noted  . Bipolar disorder (HCC) 07/12/2020  . Marijuana use 07/12/2020  . On anticoagulant therapy 07/12/2020  . Nerve pain 07/12/2020  . Right otitis media 07/03/2020  . Pharyngitis 07/03/2020  . Moderate episode of recurrent major depressive disorder (HCC)   . Other chronic pain   . Chronic pain syndrome   . Acute non-recurrent sinusitis 06/01/2020  . Anxiety 06/01/2020  . Encounter for medication refill 06/01/2020  . No-show for appointment  05/28/2020  . 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 Chronic joint pain 05/11/2020  . Hypertension 05/11/2020  . Change in vision 05/11/2020  . Body mass index 26.0-26.9, adult 05/11/2020  . Hyperlipidemia 05/10/2020  . Pacemaker 05/08/2020  . Lumbar radiculopathy  03/30/2020   Past Medical History:  Diagnosis Date  . Hypertension    No Known Allergies    Medications: Outpatient Medications Prior to Visit  Medication Sig  . amLODipine (NORVASC) 5 MG tablet Take 1 tablet (5 mg total) by mouth daily. (Patient not taking: Reported on 09/11/2020)  . atorvastatin (LIPITOR) 10 MG tablet Take 10 mg by mouth daily. (Patient not taking: Reported on 09/11/2020)  . baclofen (LIORESAL) 10 MG tablet Take 1 tablet (10 mg total) by mouth 2 (two) times daily as needed for muscle spasms. (Patient not taking: Reported on 09/11/2020)  . diclofenac Sodium (VOLTAREN) 1 % GEL Apply 2 g topically 4 (four) times daily. (Patient not taking: Reported on 09/11/2020)  . escitalopram (LEXAPRO) 10 MG tablet Take 1 tablet (10 mg total) by mouth daily. (Patient not taking: Reported on 09/11/2020)  . ezetimibe (ZETIA) 10 MG tablet Take 1 tablet (10 mg total) by mouth daily. (Patient not taking: No sig reported)  . fluticasone (FLONASE) 50 MCG/ACT nasal spray Place 2 sprays into both nostrils daily. (Patient not taking: Reported on 09/11/2020)  . gabapentin (NEURONTIN) 300 MG capsule Take 1 capsule (300 mg total) by mouth 3 (three) times daily. (Patient not taking: Reported on 09/11/2020)  . ticagrelor (BRILINTA) 60 MG TABS tablet Take 1 tablet (60 mg total) by mouth 2 (two) times daily. (Patient not taking: Reported on 09/11/2020)  . traMADol (ULTRAM) 50 MG tablet Take 1 tablet (50 mg total) by mouth every 12 (twelve) hours as needed for severe pain. (Patient not taking: Reported on 09/11/2020)   No facility-administered medications prior to visit.    Review of Systems  Constitutional: Negative.   HENT: Negative.   Eyes: Negative.   Respiratory: Negative.   Cardiovascular: Negative.   Gastrointestinal: Negative.   Endocrine: Negative.   Genitourinary: Negative.   Musculoskeletal: Positive for arthralgias (chronic ). Negative for back pain, gait problem, joint swelling, myalgias, neck pain and  neck stiffness.  Skin: Negative.   Allergic/Immunologic: Negative for environmental allergies, food allergies and immunocompromised state.  Psychiatric/Behavioral: Negative for agitation, behavioral problems, confusion, decreased concentration, dysphoric mood, hallucinations, sleep disturbance and suicidal ideas. The patient is nervous/anxious. The patient is not hyperactive.     Last CBC Lab Results  Component Value Date   WBC 9.2 07/01/2020   HGB 12.1 07/01/2020   HCT 36.8 07/01/2020   MCV 91.3 07/01/2020   MCH 30.0 07/01/2020   RDW 16.4 (H) 07/01/2020   PLT 296 07/01/2020   Last metabolic panel Lab Results  Component Value Date   GLUCOSE 101 (H) 07/01/2020   NA 142 07/01/2020   K 3.7 07/01/2020   CL 109 07/01/2020   CO2 22 07/01/2020   BUN 23 07/01/2020   CREATININE 0.95 07/01/2020   GFRNONAA >60 07/01/2020   GFRAA 73 05/08/2020   CALCIUM 9.2 07/01/2020   PROT 6.9 07/01/2020   ALBUMIN 3.8 07/01/2020   LABGLOB 2.4 05/08/2020   AGRATIO 1.9 05/08/2020   BILITOT 0.6 07/01/2020   ALKPHOS 56 07/01/2020   AST 23 07/01/2020   ALT 20 07/01/2020   ANIONGAP 11 07/01/2020   Last lipids Lab Results  Component Value Date   CHOL 225 (H) 05/08/2020  HDL 86 05/08/2020   LDLCALC 110 (H) 05/08/2020   TRIG 174 (H) 05/08/2020   Last hemoglobin A1c No results found for: HGBA1C Last thyroid functions Lab Results  Component Value Date   TSH 2.920 05/08/2020   Last vitamin D No results found for: 25OHVITD2, 25OHVITD3, VD25OH Last vitamin B12 and Folate No results found for: VITAMINB12, FOLATE    Objective    There were no vitals taken for this visit. BP Readings from Last 3 Encounters:  07/12/20 (!) 147/89  07/01/20 (!) 157/87  05/08/20 (!) 157/89   Wt Readings from Last 3 Encounters:  07/12/20 149 lb 3.2 oz (67.7 kg)  06/30/20 170 lb (77.1 kg)  05/08/20 142 lb 12.8 oz (64.8 kg)      Patient is alert and oriented and responsive to questions Engages in  conversation with provider. Speaks in full sentences without any pauses without any shortness of breath or distress.    Assessment & Plan     1. Bipolar affective disorder, remission status unspecified (HCC) Has been referred to psychiatry and not yet gone, has been called and also given number again today. Her sister also on phone with patient and will have patient call.   2. Anxiety Has been referred to psychiatry and not yet gone, has been called and also given number again today. Her sister also on phone with patient and will have patient call. Walk in [facility] also discussed for acute care/ appointment offered in office patient declined.  3. Other chronic pain *Has been referred to pain management  and not yet gone, has been called and also given number again today. Her sister also on phone with patient and will have patient call. Given Dr. Garnett Farm office number.  Also given patient number to [facility] she has missed appointment there as well for chronic stomach pain.   4. Pacemaker Has been sent letter from cardiology, given there number to call.   5. Hyperlipidemia, unspecified hyperlipidemia type Diet and exercise and continue current medications.   6. Hypertension, unspecified type Needs blood pressure check in office she has declined to come in. Does not check at home.   7. Chronic joint pain Keep rheumatology visits and also schedule with Dr. Cherylann Ratel.   Establish care with a new primary care provider as soon as possible as per letter on 08/14/2020. Office policy and patient is discharged as of 09/12/2020. Will give courtesy refill of chronic medications and no more than 30 days. Needs to establish for care and repeat labs and vital signs as soon as possible.   Red Flags discussed. The patient was given clear instructions to go to ER or return to medical center if any red flags develop, symptoms do not improve, worsen or new problems develop. They verbalized understanding.   Return  in about 1 week (around 09/18/2020), or if symptoms worsen or fail to improve, for at any time for any worsening symptoms, Go to Emergency room/ urgent care if worse.    I discussed the assessment and treatment plan with the patient. The patient was provided an opportunity to ask questions and all were answered. The patient agreed with the plan and demonstrated an understanding of the instructions.   The patient was advised to call back or seek an in-person evaluation if the symptoms worsen or if the condition fails to improve as anticipated.  I provided 30 minutes of non-face-to-face time during this encounter. The entirety of the information documented in the History of Present Illness, Review  of Systems and Physical Exam were personally obtained by me. Portions of this information were initially documented by the CMA and reviewed by me for thoroughness and accuracy.    Jairo BenMichelle Smith Simar Pothier, FNP [facility] [phone] (phone) [phone] (fax)  [facility]

## 2020-09-14 ENCOUNTER — Other Ambulatory Visit: Payer: Self-pay | Admitting: Adult Health

## 2020-09-14 DIAGNOSIS — G8929 Other chronic pain: Secondary | ICD-10-CM

## 2020-09-14 NOTE — Telephone Encounter (Signed)
Medication Refill - Medication: traMADol (ULTRAM) 50 MG tablet Pt stated Marcelino Duster would refill meds for 30 days until pt gets new PCP/ please advise   Has the patient contacted their pharmacy? Yes.   (Agent: If no, request that the patient contact the pharmacy for the refill.) (Agent: If yes, when and what did the pharmacy advise?)  Preferred Pharmacy (with phone number or street name): [id number] [place], [place] - [address]  [address] [place], [place] [address]  Phone:  [phone] Fax:  [phone]  Agent: Please be advised that RX refills may take up to 3 business days. We ask that you follow-up with your pharmacy.

## 2020-09-14 NOTE — Telephone Encounter (Signed)
30 day refill sent as courtesy, she is aware to find new PCP as well as sister who was on last telephone visit with patient.

## 2020-09-18 ENCOUNTER — Telehealth: Payer: Self-pay

## 2020-09-18 NOTE — Telephone Encounter (Signed)
Patient was referred to psychiatry see notes on referral:Patient told psychiatry office she was going to call them back, be sure sister on DPR.  Type Date User Summary Attachment  Specialty Comments 08/20/2020  2:16 PM Elvina Mattes, CMA - -  Note   Spoke with patient and she states that she is out of state with her family and she not feeling well and that she call when she gets back.           . Type Date User Summary Attachment  Specialty Comments 06/11/2020  4:32 PM Elvina Mattes, CMA - -  Note   Tc on 06-11-20 @ 4:31 spoke with patient.  New pt paperwork mailed once our office receives completed than an appt will be mailed.          . Type Date User Summary Attachment  Provider Comments 06/01/2020  4:30 PM Flinchum, Eula Fried, FNP Provider Comments -  Note   Anxiety wants psychiatry.    Reason for Referral:   Has the referral been discussed with the patient?: on file    Designated contact for the referral if not the patient (name/phone number): on fiile    Has the patient seen a specialist for this issue before?:  If so, who (practice/provider)?on file    Does the patient have a provider or location preference for the referral?:on file  Would the patient like to see previous specialist if applicable? On file          Also give [facility] information below or [facility] in [place]. Sh e can walk in at one of those. She should be seen by psychiatry as soon as possible and is seeking new primary care provider. If emergent symptoms  at anytime or suicidal please have her to to the emergency room for evaluation for behavioral health admission.   Psychiatric/Counseling Resources Discussed As Follows:  If Emergency please seek Emergency Room Care Immediately or Call 911.   [facility] Address:  [address] [place], [place] [address] Phone: [phone] Website : [email or web address]   [facility] Address:  [address] [place], [place] [address] Phone: [phone] Fax: [phone] Website: [email or web address] How To Access Our Services Because our main goal is to meet the needs of our consumers, [facility] operates on a walk-in basis! To access services, there are just 3 easy steps: 1) Walk in any Monday, Wednesday or Friday between 8:00 am and 3:00 pm and complete our consumer paperwork 2) A Comprehensive Clinical Assessment (CCA) will be completed and appropriate service recommendations will be provided 3) Recommendations are sent to [facility] team members and the appropriate staff will call you within days. Advanced Access Open M - F, 8:00 am - 8:00 pm  Mental health crisis services for all age groups  Triage  Psychiatric Evaluations  Involuntary Commitments  [facility]  Address: [address] [place], [place] [address] Website : [email or web address] Walk in's accepted see web site or call for more information Phone : [phone] Also has [facility] [phone]    Psychology Today Find a therapist by searching online in your area or specialist by your diagnosis Website:  https://[email or web address]/us

## 2020-09-18 NOTE — Telephone Encounter (Signed)
Copied from CRM [id number]. Topic: General - Inquiry >> Sep 18, 2020 10:03 AM Daphine Deutscher D wrote: Reason for CRM: Pt's sister Charlean Sanfilippo called saying pt is very depressed and she was thinking Marvell Fuller was getting her a referral for that.  She said she does not think the medications are not  helping .  She said all she wants to do is lay in the bed.   CB#  [phone]

## 2020-09-19 ENCOUNTER — Telehealth: Payer: Self-pay | Admitting: Adult Health

## 2020-09-19 DIAGNOSIS — F319 Bipolar disorder, unspecified: Secondary | ICD-10-CM

## 2020-09-19 DIAGNOSIS — F419 Anxiety disorder, unspecified: Secondary | ICD-10-CM

## 2020-09-19 NOTE — Telephone Encounter (Signed)
Contacted sister and explained to her again that a DPR needs to be filled out that I cannot disclose any medical information without patient consent. She states that patient is sleeping in bed refusing to get out unless to use restroom she states that patient is only taking a bath every 3 days and states she dosent know what to do. I advised her I will call patient and speak with her if she has questions in regards to referral. Renette Butters

## 2020-09-19 NOTE — Telephone Encounter (Signed)
Noted  

## 2020-09-19 NOTE — Telephone Encounter (Signed)
Patient called to request another referral for [facility] facility because they said the current referral was outdated and they would need another one from the PCP.  Please advise and call patient to discuss at [phone] or sister Coralee North) at [phone]

## 2020-09-19 NOTE — Telephone Encounter (Signed)
FYI, Referral sent

## 2020-09-19 NOTE — Telephone Encounter (Signed)
note

## 2020-09-19 NOTE — Telephone Encounter (Signed)
Called and spoke with patient on the phone and gave her information for [facility], I also advised her of [facility] for emergency matters, she was advised to call back our office with any new worsening symptoms. KW

## 2020-10-03 ENCOUNTER — Other Ambulatory Visit: Payer: Self-pay

## 2020-10-03 ENCOUNTER — Encounter: Payer: Self-pay | Admitting: Cardiology

## 2020-10-03 ENCOUNTER — Ambulatory Visit (INDEPENDENT_AMBULATORY_CARE_PROVIDER_SITE_OTHER): Payer: 59 | Admitting: Cardiology

## 2020-10-03 VITALS — BP 144/84 | HR 84 | Ht 62.0 in | Wt 138.0 lb

## 2020-10-03 DIAGNOSIS — I495 Sick sinus syndrome: Secondary | ICD-10-CM | POA: Diagnosis not present

## 2020-10-03 DIAGNOSIS — Z950 Presence of cardiac pacemaker: Secondary | ICD-10-CM | POA: Diagnosis not present

## 2020-10-03 DIAGNOSIS — I10 Essential (primary) hypertension: Secondary | ICD-10-CM | POA: Diagnosis not present

## 2020-10-03 DIAGNOSIS — F319 Bipolar disorder, unspecified: Secondary | ICD-10-CM

## 2020-10-03 DIAGNOSIS — G894 Chronic pain syndrome: Secondary | ICD-10-CM | POA: Diagnosis not present

## 2020-10-03 MED ORDER — AMLODIPINE BESYLATE 10 MG PO TABS: 10.0000 mg | ORAL_TABLET | Freq: Every day | ORAL | 3 refills | Status: DC

## 2020-10-03 NOTE — Progress Notes (Signed)
Electrophysiology Office Note:    Date:  10/03/2020   ID:  Kari Hughes, DOB 08/06/1950, MRN [id number]  PCP:  Enid Baas, MD  [facility] Cardiologist:  No primary care provider on file.  [facility] Electrophysiologist:  None   Referring MD: No ref. provider found   Chief Complaint: Sinus node dysfunction  History of Present Illness:    Kari Hughes is a 70 y.o. female who presents for an evaluation of sinus node dysfunction post permanent pacemaker implant at the request of Dr. Nemiah Commander. Their medical history includes hypertension, fibromyalgia, bipolar affective disorder.  She is in clinic today with her sister.  She tells me she is doing well regarding her permanent pacemaker.  It was implanted in [place].  She is relocated to the area and would like to establish care with [place].  Past Medical History:  Diagnosis Date  . Hypertension     Past Surgical History:  Procedure Laterality Date  . JOINT REPLACEMENT Left 2013  . PACEMAKER PLACEMENT  2019    Current Medications: Current Meds  Medication Sig  . amLODipine (NORVASC) 10 MG tablet Take 1 tablet (10 mg total) by mouth daily.  Marland Kitchen atorvastatin (LIPITOR) 10 MG tablet Take 10 mg by mouth daily.  . baclofen (LIORESAL) 10 MG tablet Take 1 tablet (10 mg total) by mouth 2 (two) times daily as needed for muscle spasms.  . diclofenac Sodium (VOLTAREN) 1 % GEL Apply 2 g topically 4 (four) times daily.  Marland Kitchen escitalopram (LEXAPRO) 10 MG tablet Take 1 tablet (10 mg total) by mouth daily.  Marland Kitchen ezetimibe (ZETIA) 10 MG tablet Take 1 tablet (10 mg total) by mouth daily.  . fluticasone (FLONASE) 50 MCG/ACT nasal spray Place 2 sprays into both nostrils daily.  Marland Kitchen gabapentin (NEURONTIN) 300 MG capsule Take 1 capsule (300 mg total) by mouth 3 (three) times daily.  . methocarbamol (ROBAXIN) 500 MG tablet Take 500 mg by mouth 3 (three) times daily. For 15 days  . traMADol (ULTRAM) 50 MG tablet TAKE 1 TABLET (50 MG TOTAL) BY  MOUTH EVERY 12 (TWELVE) HOURS AS NEEDED FOR SEVERE PAIN.  . [DISCONTINUED] amLODipine (NORVASC) 5 MG tablet Take 1 tablet (5 mg total) by mouth daily.     Allergies:   Patient has no known allergies.   Social History   Socioeconomic History  . Marital status: Single    Spouse name: Not on file  . Number of children: Not on file  . Years of education: Not on file  . Highest education level: Not on file  Occupational History  . Not on file  Tobacco Use  . Smoking status: Never Smoker  . Smokeless tobacco: Never Used  Substance and Sexual Activity  . Alcohol use: Not Currently  . Drug use: Not Currently  . Sexual activity: Not on file  Other Topics Concern  . Not on file  Social History Narrative  . Not on file   Social Determinants of Health   Financial Resource Strain: Not on file  Food Insecurity: Not on file  Transportation Needs: Not on file  Physical Activity: Not on file  Stress: Not on file  Social Connections: Not on file     Family History: The patient's family history is not on file.  ROS:   Please see the history of present illness.    All other systems reviewed and are negative.  EKGs/Labs/Other Studies Reviewed:    The following studies were reviewed today:  October 03, 2020 device interrogation  personally reviewed DDD-CLS at 60-1 30 Lead parameters stable Atrially pacing between 50 and 75% of the time Battery longevity estimated at 5 years, 1 month  EKG:  The ekg ordered today demonstrates atrial paced, ventricular sensed  Recent Labs: 05/08/2020: TSH 2.920 07/01/2020: ALT 20; BUN 23; Creatinine, Ser 0.95; Hemoglobin 12.1; Platelets 296; Potassium 3.7; Sodium 142  Recent Lipid Panel    Component Value Date/Time   CHOL 225 (H) 05/08/2020 1430   TRIG 174 (H) 05/08/2020 1430   HDL 86 05/08/2020 1430   LDLCALC 110 (H) 05/08/2020 1430    Physical Exam:    VS:  BP (!) 144/84   Pulse 84   Ht 5\' 2"  (1.575 m)   Wt 138 lb (62.6 kg)   SpO2 95%    BMI 25.24 kg/m     Wt Readings from Last 3 Encounters:  10/03/20 138 lb (62.6 kg)  07/12/20 149 lb 3.2 oz (67.7 kg)  06/30/20 170 lb (77.1 kg)     GEN:  Well nourished, well developed in no acute distress HEENT: Normal NECK: No JVD; No carotid bruits LYMPHATICS: No lymphadenopathy CARDIAC: RRR, no murmurs, rubs, gallops.  Pacemaker pocket well-healed without pain. RESPIRATORY:  Clear to auscultation without rales, wheezing or rhonchi  ABDOMEN: Soft, non-tender, non-distended MUSCULOSKELETAL:  No edema; No deformity  SKIN: Warm and dry NEUROLOGIC:  Alert and oriented x 3 PSYCHIATRIC:  Normal affect   ASSESSMENT:    1. Hypertension, unspecified type    PLAN:    In order of problems listed above:  1. Hypertension Above goal during today's exam.  I have recommended she increase her amlodipine to 10 mg once daily.  She will continue to follow-up with her primary care physician regarding future antihypertensive adjustments.  2.  Pacemaker in situ Implanted for sick sinus syndrome.  Pacemaker interrogation reveals a well-functioning device with good battery longevity.  I will establish her for remote monitoring in our clinic and I will see her back in person in 1 year.  3.  Chronic pain Patient has upcoming appointment for possible injections for some of her chronic joint pain.  I do not have a reason that she should not pursue injection to help her pain from a cardiovascular perspective.     Medication Adjustments/Labs and Tests Ordered: Current medicines are reviewed at length with the patient today.  Concerns regarding medicines are outlined above.  Orders Placed This Encounter  Procedures  . EKG 12-Lead   Meds ordered this encounter  Medications  . amLODipine (NORVASC) 10 MG tablet    Sig: Take 1 tablet (10 mg total) by mouth daily.    Dispense:  90 tablet    Refill:  3     Signed, 07/02/20, MD, [facility]  10/03/2020 8:04 PM    Electrophysiology [facility]

## 2020-10-03 NOTE — Patient Instructions (Addendum)
Medication Instructions:  Your physician has recommended you make the following change in your medication:   1.  INCREASE your amlodipine --Take 10 mg by mouth once a day  You may take 2 tablets of your 5 mg tablets once a day until they are gone.   *If you need a refill on your cardiac medications before your next appointment, please call your pharmacy*  Lab Work: None ordered. If you have labs (blood work) drawn today and your tests are completely normal, you will receive your results only by: Marland Kitchen MyChart Message (if you have MyChart) OR . A paper copy in the mail If you have any lab test that is abnormal or we need to change your treatment, we will call you to review the results.  Testing/Procedures: None ordered.  Follow-Up: At [facility], you and your health needs are our priority.  As part of our continuing mission to provide you with exceptional heart care, we have created designated Provider Care Teams.  These Care Teams include your primary Cardiologist (physician) and Advanced Practice Providers (APPs -  Physician Assistants and Nurse Practitioners) who all work together to provide you with the care you need, when you need it.  Your next appointment:   Your physician wants you to follow-up in: one year with Dr. Lalla Brothers.   You will receive a reminder letter in the mail two months in advance. If you don't receive a letter, please call our office to schedule the follow-up appointment.  You will be set up with remote monitoring by the device clinic.  Device clinic # [phone]

## 2020-10-09 ENCOUNTER — Encounter: Payer: Self-pay | Admitting: Student in an Organized Health Care Education/Training Program

## 2020-10-09 ENCOUNTER — Ambulatory Visit
Payer: 59 | Attending: Student in an Organized Health Care Education/Training Program | Admitting: Student in an Organized Health Care Education/Training Program

## 2020-10-09 ENCOUNTER — Other Ambulatory Visit: Payer: Self-pay

## 2020-10-09 VITALS — BP 160/107 | HR 85 | Temp 99.9°F | Resp 16 | Ht 62.0 in | Wt 138.0 lb

## 2020-10-09 DIAGNOSIS — G894 Chronic pain syndrome: Secondary | ICD-10-CM

## 2020-10-09 DIAGNOSIS — F1290 Cannabis use, unspecified, uncomplicated: Secondary | ICD-10-CM

## 2020-10-09 DIAGNOSIS — M797 Fibromyalgia: Secondary | ICD-10-CM | POA: Diagnosis present

## 2020-10-09 DIAGNOSIS — M5441 Lumbago with sciatica, right side: Secondary | ICD-10-CM | POA: Insufficient documentation

## 2020-10-09 DIAGNOSIS — M5136 Other intervertebral disc degeneration, lumbar region: Secondary | ICD-10-CM | POA: Diagnosis present

## 2020-10-09 DIAGNOSIS — G8929 Other chronic pain: Secondary | ICD-10-CM | POA: Diagnosis present

## 2020-10-09 DIAGNOSIS — M51369 Other intervertebral disc degeneration, lumbar region without mention of lumbar back pain or lower extremity pain: Secondary | ICD-10-CM

## 2020-10-09 DIAGNOSIS — M5442 Lumbago with sciatica, left side: Secondary | ICD-10-CM | POA: Insufficient documentation

## 2020-10-09 DIAGNOSIS — M47816 Spondylosis without myelopathy or radiculopathy, lumbar region: Secondary | ICD-10-CM | POA: Diagnosis present

## 2020-10-09 DIAGNOSIS — M48062 Spinal stenosis, lumbar region with neurogenic claudication: Secondary | ICD-10-CM | POA: Diagnosis present

## 2020-10-09 DIAGNOSIS — M4800 Spinal stenosis, site unspecified: Secondary | ICD-10-CM | POA: Insufficient documentation

## 2020-10-09 DIAGNOSIS — M2550 Pain in unspecified joint: Secondary | ICD-10-CM | POA: Insufficient documentation

## 2020-10-09 MED ORDER — DULOXETINE HCL 20 MG PO CPEP: ORAL_CAPSULE | ORAL | 0 refills | Status: DC

## 2020-10-09 NOTE — Progress Notes (Signed)
Patient: Kari Hughes  Service Category: E/M  Provider: Gillis Santa, MD  DOB: 05-10-1951  DOS: 10/09/2020  Referring Provider: Sharmon Leyden*  MRN: [id number]  Setting: Ambulatory outpatient  PCP: Gladstone Lighter, MD  Type: New Patient  Specialty: Interventional Pain Management    Location: Office  Delivery: Face-to-face     Primary Reason(s) for Visit: Encounter for initial evaluation of one or more chronic problems (new to examiner) potentially causing chronic pain, and posing a threat to normal musculoskeletal function. (Level of risk: High) CC: Neck Pain and Joint Pain  HPI  Kari Hughes is a 70 y.o. year old, female patient, who comes for the first time to our practice referred by Doreen Beam, F* for our initial evaluation of her chronic pain. She has Lumbar radiculopathy; Pacemaker; Hyperlipidemia; Chronic bilateral low back pain with bilateral sciatica; Chronic joint pain; Hypertension; Change in vision; Body mass index 26.0-26.9, adult; No-show for appointment; Acute non-recurrent sinusitis; Anxiety; Encounter for medication refill; Moderate episode of recurrent major depressive disorder ([place]); Fibromyalgia; Chronic pain syndrome; Right otitis media; Pharyngitis; Bipolar disorder ([place]); Marijuana use; On anticoagulant therapy; Nerve pain; Lumbar degenerative disc disease; Lumbar facet arthropathy; and Spinal stenosis, lumbar region, with neurogenic claudication on their problem list. Today she comes in for evaluation of her Neck Pain and Joint Pain  Pain Assessment: Location:   Other (Comment) Radiating:  everywhere Onset: More than a month ago Duration: Chronic pain Quality: Heaviness,Burning,Aching,Pressure Severity: 10-Worst pain ever/10 (subjective, self-reported pain score)  Effect on ADL: difficulty performing daily activities Timing: Constant Modifying factors: nothing BP: (!) 160/107  HR: 85  Onset and Duration: Gradual and Present longer than 3  months Cause of pain: Unknown Severity: Getting worse Timing: Not influenced by the time of the day Aggravating Factors: Walking Alleviating Factors: Warm showers or baths Associated Problems: Constipation, Day-time cramps, Night-time cramps, Depression, Dizziness, Fatigue, Inability to control bladder (urine), Inability to control bowel, Nausea, Numbness, Personality changes, Sadness, Spasms, Sweating, Pain that wakes patient up and Pain that does not allow patient to sleep Quality of Pain: Aching, Burning, Nagging, Sharp, Throbbing and Tiring Previous Examinations or Tests: Bone scan, CT scan, MRI scan, X-rays and Orthopedic evaluation Previous Treatments: Trigger point injections  Kari Hughes is a 70 year old female who presents with complaints of pain all over her body primarily her neck.  Patient has a history of fibromyalgia.  She is seeing Dr. Posey Pronto for fibromyalgia and is currently managed on gabapentin 400 mg 3 times a day as well as Robaxin.  Her gabapentin was recently increased from 300 mg 3 times a day.  She is unable to tolerate NSAIDs as she is anticoagulated on Brilinta.  She has a 62-monthfollow-up with Dr. PPosey Pronto  Her last visit was 10/03/2020.  Of note patient does have a history of marijuana use with her most recent [facility] she states that her last intake was over 6 weeks ago..  She also has a pacemaker in place and was previously on Brilinta.  Patient also has a history of bipolar disorder.  She is on 10 mg of Lexapro.  Historic Controlled Substance Pharmacotherapy Review  PMP and historical list of controlled substances:   Tramadol 50 mg twice a day  Historical Monitoring: The patient  reports previous drug use. List of all UDS Test(s): Lab Results  Component Value Date   MDMA NONE DETECTED 07/01/2020   COCAINSCRNUR NONE DETECTED 07/01/2020   PCPSCRNUR NONE DETECTED 07/01/2020   THCU  POSITIVE (A) 07/01/2020   ETH <10 07/01/2020   List of other  Serum/Urine Drug Screening Test(s):  Lab Results  Component Value Date   COCAINSCRNUR NONE DETECTED 07/01/2020   THCU POSITIVE (A) 07/01/2020   ETH <10 07/01/2020   Historical Background Evaluation: [place] PMP: PDMP not reviewed this encounter. Online review of the past 5-monthperiod conducted.             [place] Department of public safety, offender search: (Editor, commissioningInformation) Non-contributory Risk Assessment Profile: Aberrant behavior: claims that "nothing else works", missing appointments for interventional therapies, non-compliance with practice rules and regulations and use of illicit substances Risk factors for fatal opioid overdose: None identified today Fatal overdose hazard ratio (HR): Calculation deferred Non-fatal overdose hazard ratio (HR): Calculation deferred Risk of opioid abuse or dependence: 0.7-3.0% with doses ? 36 MME/day and 6.1-26% with doses ? 120 MME/day. Substance use disorder (SUD) risk level: See below Personal History of Substance Abuse (SUD-Substance use disorder):  Alcohol: Negative  Illegal Drugs: Negative  Rx Drugs: Negative  ORT Risk Level calculation: Low Risk  Opioid Risk Tool - 10/09/20 0822      Family History of Substance Abuse   Alcohol Negative    Illegal Drugs Negative    Rx Drugs Negative      Personal History of Substance Abuse   Alcohol Negative    Illegal Drugs Negative    Rx Drugs Negative      Age   Age between 166-45years  No      History of Preadolescent Sexual Abuse   History of Preadolescent Sexual Abuse Negative or Female      Psychological Disease   Psychological Disease Negative    Depression Positive      Total Score   Opioid Risk Tool Scoring 1    Opioid Risk Interpretation Low Risk          ORT Scoring interpretation table:  Score <3 = Low Risk for SUD  Score between 4-7 = Moderate Risk for SUD  Score >8 = High Risk for Opioid Abuse   PHQ-2 Depression Scale:  Total score: 0  PHQ-2 Scoring interpretation table:  (Score and probability of major depressive disorder)  Score 0 = No depression  Score 1 = 15.4% Probability  Score 2 = 21.1% Probability  Score 3 = 38.4% Probability  Score 4 = 45.5% Probability  Score 5 = 56.4% Probability  Score 6 = 78.6% Probability   PHQ-9 Depression Scale:  Total score: 0  PHQ-9 Scoring interpretation table:  Score 0-4 = No depression  Score 5-9 = Mild depression  Score 10-14 = Moderate depression  Score 15-19 = Moderately severe depression  Score 20-27 = Severe depression (2.4 times higher risk of SUD and 2.89 times higher risk of overuse)   Pharmacologic Plan: As per protocol, I have not taken over any controlled substance management, pending the results of ordered tests and/or consults.            Initial impression: Pending review of available data and ordered tests.  History of bipolar disorder, depression, anxiety, history of marijuana use.    Meds   Current Outpatient Medications:  .  amLODipine (NORVASC) 10 MG tablet, Take 1 tablet (10 mg total) by mouth daily., Disp: 90 tablet, Rfl: 3 .  diphenhydrAMINE (BENADRYL) 25 MG tablet, Take 25 mg by mouth every 8 (eight) hours as needed., Disp: , Rfl:  .  DULoxetine (CYMBALTA) 20 MG capsule, Take 1 capsule (20 mg  total) by mouth daily for 30 days, THEN 2 capsules (40 mg total) daily., Disp: 90 capsule, Rfl: 0 .  ezetimibe (ZETIA) 10 MG tablet, Take 1 tablet (10 mg total) by mouth daily., Disp: 90 tablet, Rfl: 1 .  gabapentin (NEURONTIN) 300 MG capsule, Take 1 capsule (300 mg total) by mouth 3 (three) times daily. (Patient taking differently: Take 400 mg by mouth 3 (three) times daily.), Disp: 90 capsule, Rfl: 11 .  loperamide (IMODIUM) 2 MG capsule, Take by mouth as needed for diarrhea or loose stools., Disp: , Rfl:  .  methocarbamol (ROBAXIN) 500 MG tablet, Take 500 mg by mouth 3 (three) times daily. For 15 days, Disp: , Rfl:  .  ticagrelor (BRILINTA) 90 MG TABS tablet, Take 60 mg by mouth 2 (two) times daily.,  Disp: , Rfl:  .  traMADol (ULTRAM) 50 MG tablet, TAKE 1 TABLET (50 MG TOTAL) BY MOUTH EVERY 12 (TWELVE) HOURS AS NEEDED FOR SEVERE PAIN., Disp: 60 tablet, Rfl: 0  Imaging Review   CT Lumbar Spine Wo Contrast  Narrative CLINICAL DATA:  Low back pain. Right leg weakness. No known injury.  EXAM: CT LUMBAR SPINE WITHOUT CONTRAST  TECHNIQUE: Multidetector CT imaging of the lumbar spine was performed without intravenous contrast administration. Multiplanar CT image reconstructions were also generated.  COMPARISON:  Plain films lumbar spine today.  FINDINGS: Segmentation: Standard.  Alignment: Facet degenerative disease results in 0.5 cm anterolisthesis L5 on S1.  Vertebrae: No acute fracture or focal pathologic process. Degenerative endplate sclerosis is most notable at L3-4.  Paraspinal and other soft tissues: Aortic atherosclerosis and sigmoid diverticulosis are seen.  Disc levels: T11-12: Mild-to-moderate facet arthropathy and a minimal disc bulge. No stenosis.  T12-L1: Shallow disc bulge and mild facet arthropathy.  No stenosis.  L1-2: Vacuum disc phenomenon, mild loss of disc space height and a bulge eccentric to the left. The central canal and foramina are open.  L2-3: Shallow disc bulge and vacuum disc phenomenon. Mild central canal and bilateral foraminal narrowing.  L3-4: Vacuum disc phenomenon, disc bulge and ligamentum flavum thickening. Mild facet degenerative disease. There is mild to moderate central canal stenosis. Moderate to moderately severe foraminal narrowing is worse on the left.  L4-5: Moderate facet arthropathy. Disc bulge and vacuum disc phenomenon. The central canal is open. Moderate to moderately severe foraminal narrowing is worse on the left.  L5-S1: Severe facet degenerative change on the right and mild-to-moderate facet arthropathy on the left. The disc is uncovered with a shallow bulge. The central canal appears open. Disc and facet  arthropathy cause severe right and moderately severe left foraminal narrowing.  IMPRESSION: No acute abnormality.  Mild to moderate central canal stenosis at L3-4 where moderate to moderately severe bilateral foraminal narrowing is worse on the left.  Moderate to moderately severe bilateral foraminal narrowing at L4-5 is worse on the left. The central canal is open.  Severe right and moderately severe left foraminal narrowing at L5-S1 due to disc and right much worse than left facet degenerative disease.  Diverticulosis.  Aortic Atherosclerosis (ICD10-I70.0).   Electronically Signed By: Inge Rise M.D. On: 03/23/2020 12:15  DG Lumbar Spine 2-3 Views  Narrative CLINICAL DATA:  Right leg weakness  EXAM: LUMBAR SPINE - 2-3 VIEW  COMPARISON:  None.  FINDINGS: Grade 1 anterolisthesis at L5-S1. Vertebral body heights are maintained apart from mild degenerative endplate irregularity. There is diffuse disc space narrowing and vacuum disc phenomenon. Small posterior endplate osteophytes are present. There is multilevel  facet hypertrophy.  IMPRESSION: Degenerative disc disease and facet hypertrophy throughout.   Electronically Signed By: Macy Mis M.D. On: 03/23/2020 11:12  Complexity Note: Imaging results reviewed. Results shared with Ms. Timson, using Layman's terms.                         ROS  Cardiovascular: High blood pressure, Heart surgery and Blood thinners:  Anticoagulant Pulmonary or Respiratory: Smoking Neurological: Incontinence:  Urinary Psychological-Psychiatric: Anxiousness, Depressed and Prone to panicking Gastrointestinal: Irregular, infrequent bowel movements (Constipation) Genitourinary: No reported renal or genitourinary signs or symptoms such as difficulty voiding or producing urine, peeing blood, non-functioning kidney, kidney stones, difficulty emptying the bladder, difficulty controlling the flow of urine, or chronic kidney  disease Hematological: No reported hematological signs or symptoms such as prolonged bleeding, low or poor functioning platelets, bruising or bleeding easily, hereditary bleeding problems, low energy levels due to low hemoglobin or being anemic Endocrine: No reported endocrine signs or symptoms such as high or low blood sugar, rapid heart rate due to high thyroid levels, obesity or weight gain due to slow thyroid or thyroid disease Rheumatologic: Generalized muscle aches (Fibromyalgia) Musculoskeletal: Negative for myasthenia gravis, muscular dystrophy, multiple sclerosis or malignant hyperthermia Work History: Disabled  Allergies  Ms. Furber has No Known Allergies.  Laboratory Chemistry Profile   Renal Lab Results  Component Value Date   BUN 23 07/01/2020   CREATININE 0.95 07/01/2020   BCR 21 05/08/2020   GFRAA 73 05/08/2020   GFRNONAA >60 07/01/2020   PROTEINUR 100 (A) 07/01/2020     Electrolytes Lab Results  Component Value Date   NA 142 07/01/2020   K 3.7 07/01/2020   CL 109 07/01/2020   CALCIUM 9.2 07/01/2020     Hepatic Lab Results  Component Value Date   AST 23 07/01/2020   ALT 20 07/01/2020   ALBUMIN 3.8 07/01/2020   ALKPHOS 56 07/01/2020     ID Lab Results  Component Value Date   SARSCOV2NAA NEGATIVE 07/01/2020     Bone No results found for: [place], VD125OH2TOT, WK0881JS3, PR9458PF2, 25OHVITD1, 25OHVITD2, 25OHVITD3, TESTOFREE, TESTOSTERONE   Endocrine Lab Results  Component Value Date   GLUCOSE 101 (H) 07/01/2020   GLUCOSEU NEGATIVE 07/01/2020   TSH 2.920 05/08/2020     Neuropathy No results found for: VITAMINB12, FOLATE, HGBA1C, HIV   CNS No results found for: COLORCSF, APPEARCSF, RBCCOUNTCSF, WBCCSF, POLYSCSF, LYMPHSCSF, EOSCSF, PROTEINCSF, GLUCCSF, JCVIRUS, CSFOLI, IGGCSF, LABACHR, ACETBL, LABACHR, ACETBL   Inflammation (CRP: Acute  ESR: Chronic) No results found for: CRP, ESRSEDRATE, LATICACIDVEN   Rheumatology Lab Results  Component Value  Date   RF <10.0 05/08/2020   ANA Negative 05/08/2020     Coagulation Lab Results  Component Value Date   PLT 296 07/01/2020     Cardiovascular Lab Results  Component Value Date   HGB 12.1 07/01/2020   HCT 36.8 07/01/2020     Screening Lab Results  Component Value Date   SARSCOV2NAA NEGATIVE 07/01/2020     Cancer No results found for: CEA, CA125, LABCA2   Allergens No results found for: ALMOND, APPLE, ASPARAGUS, AVOCADO, BANANA, BARLEY, BASIL, BAYLEAF, GREENBEAN, LIMABEAN, WHITEBEAN, BEEFIGE, REDBEET, BLUEBERRY, BROCCOLI, CABBAGE, MELON, CARROT, CASEIN, CASHEWNUT, CAULIFLOWER, CELERY     Note: Lab results reviewed.  PFSH  Drug: Ms. Ehrsam  reports previous drug use. Alcohol:  reports previous alcohol use. Tobacco:  reports that she has been smoking. She has never used smokeless tobacco. Medical:  has a  past medical history of Hypertension. Family: family history is not on file.  Past Surgical History:  Procedure Laterality Date  . JOINT REPLACEMENT Left 2013  . PACEMAKER PLACEMENT  2019   Active Ambulatory Problems    Diagnosis Date Noted  . Lumbar radiculopathy 03/30/2020  . Pacemaker 05/08/2020  . Hyperlipidemia 05/10/2020  . 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 Chronic joint pain 05/11/2020  . Hypertension 05/11/2020  . Change in vision 05/11/2020  . Body mass index 26.0-26.9, adult 05/11/2020  . No-show for appointment 05/28/2020  . Acute non-recurrent sinusitis 06/01/2020  . Anxiety 06/01/2020  . Encounter for medication refill 06/01/2020  . Moderate episode of recurrent major depressive disorder ([place])   . Fibromyalgia   . Chronic pain syndrome   . Right otitis media 07/03/2020  . Pharyngitis 07/03/2020  . Bipolar disorder ([place]) 07/12/2020  . Marijuana use 07/12/2020  . On anticoagulant therapy 07/12/2020  . Nerve pain 07/12/2020  . Lumbar degenerative disc disease 10/09/2020  . Lumbar facet arthropathy 10/09/2020   . Spinal stenosis, lumbar region, with neurogenic claudication 10/09/2020   Resolved Ambulatory Problems    Diagnosis Date Noted  . No Resolved Ambulatory Problems   No Additional Past Medical History   Constitutional Exam  General appearance: alert, cooperative and slowed mentation Vitals:   10/09/20 0819  BP: (!) 160/107  Pulse: 85  Resp: 16  Temp: 99.9 F (37.7 C)  TempSrc: Temporal  SpO2: 97%  Weight: 138 lb (62.6 kg)  Height: '5\' 2"'  (1.575 m)   BMI Assessment: Estimated body mass index is 25.24 kg/m as calculated from the following:   Height as of this encounter: '5\' 2"'  (1.575 m).   Weight as of this encounter: 138 lb (62.6 kg).  BMI interpretation table: BMI level Category Range association with higher incidence of chronic pain  <18 kg/m2 Underweight   18.5-24.9 kg/m2 Ideal body weight   25-29.9 kg/m2 Overweight Increased incidence by 20%  30-34.9 kg/m2 Obese (Class I) Increased incidence by 68%  35-39.9 kg/m2 Severe obesity (Class II) Increased incidence by 136%  >40 kg/m2 Extreme obesity (Class III) Increased incidence by 254%   Patient's current BMI Ideal Body weight  Body mass index is 25.24 kg/m. Ideal body weight: 50.1 kg (110 lb 7.2 oz) Adjusted ideal body weight: 55.1 kg (121 lb 7.5 oz)   BMI Readings from Last 4 Encounters:  10/09/20 25.24 kg/m  10/03/20 25.24 kg/m  07/12/20 27.29 kg/m  06/30/20 31.09 kg/m   Wt Readings from Last 4 Encounters:  10/09/20 138 lb (62.6 kg)  10/03/20 138 lb (62.6 kg)  07/12/20 149 lb 3.2 oz (67.7 kg)  06/30/20 170 lb (77.1 kg)    Psych/Mental status: Alert, oriented x 3 (person, place, & time)       Eyes: PERLA Respiratory: No evidence of acute respiratory distress  Cervical Spine Exam  Skin & Axial Inspection: No masses, redness, edema, swelling, or associated skin lesions Alignment: Symmetrical Functional ROM: Unrestricted ROM      Stability: No instability detected Muscle Tone/Strength: Functionally  intact. No obvious neuro-muscular anomalies detected. Sensory (Neurological): Musculoskeletal pain pattern Palpation: No palpable anomalies              Upper Extremity (UE) Exam    Side: Right upper extremity  Side: Left upper extremity  Skin & Extremity Inspection: Contracture of hands  Skin & Extremity Inspection: Contracture of hands  Functional ROM: Pain restricted ROM for all joints of upper extremity  Functional ROM: Pain restricted ROM for all joints of upper extremity  Muscle Tone/Strength: Functionally intact. No obvious neuro-muscular anomalies detected.   Muscle Tone/Strength: Functionally intact. No obvious neuro-muscular anomalies detected.  Sensory (Neurological): Unimpaired          Sensory (Neurological): Unimpaired          Palpation: Complains of area being tender to palpation              Palpation: No palpable anomalies              Provocative Test(s):  Phalen's test: deferred Tinel's test: deferred Apley's scratch test (touch opposite shoulder):  Action 1 (Across chest): Decreased ROM Action 2 (Overhead): Decreased ROM Action 3 (LB reach): Decreased ROM   Provocative Test(s):  Phalen's test: deferred Tinel's test: deferred Apley's scratch test (touch opposite shoulder):  Action 1 (Across chest): Decreased ROM Action 2 (Overhead): Decreased ROM Action 3 (LB reach): Decreased ROM    Thoracic Spine Area Exam  Skin & Axial Inspection: No masses, redness, or swelling Alignment: Symmetrical Functional ROM: Unrestricted ROM Stability: No instability detected Muscle Tone/Strength: Functionally intact. No obvious neuro-muscular anomalies detected. Sensory (Neurological): Musculoskeletal pain pattern  Lumbar Exam  Skin & Axial Inspection: No masses, redness, or swelling Alignment: Asymmetric Functional ROM: Pain restricted ROM affecting both sides Stability: No instability detected Muscle Tone/Strength: Functionally intact. No obvious neuro-muscular anomalies  detected. Sensory (Neurological): Musculoskeletal pain pattern   Gait & Posture Assessment  Ambulation: Patient came in today in a wheel chair Gait: Significantly limited. Dependent on assistive device to ambulate Posture: Difficulty standing up straight, due to pain   Lower Extremity Exam    Side: Right lower extremity  Side: Left lower extremity  Stability: No instability observed          Stability: No instability observed          Skin & Extremity Inspection: Skin color, temperature, and hair growth are WNL. No peripheral edema or cyanosis. No masses, redness, swelling, asymmetry, or associated skin lesions. No contractures.  Skin & Extremity Inspection: Skin color, temperature, and hair growth are WNL. No peripheral edema or cyanosis. No masses, redness, swelling, asymmetry, or associated skin lesions. No contractures.  Functional ROM: Pain restricted ROM for hip and knee joints          Functional ROM: Pain restricted ROM for hip and knee joints          Muscle Tone/Strength: Functionally intact. No obvious neuro-muscular anomalies detected.  Muscle Tone/Strength: Functionally intact. No obvious neuro-muscular anomalies detected.  Sensory (Neurological): Musculoskeletal pain pattern        Sensory (Neurological): Musculoskeletal pain pattern        DTR: Patellar: deferred today Achilles: deferred today Plantar: deferred today  DTR: Patellar: deferred today Achilles: deferred today Plantar: deferred today  Palpation: No palpable anomalies  Palpation: No palpable anomalies   Assessment  Primary Diagnosis & Pertinent Problem List: The primary encounter diagnosis was Chronic pain syndrome. Diagnoses of Chronic joint pain, Chronic bilateral low back pain with bilateral sciatica, Marijuana use, Fibromyalgia, Lumbar degenerative disc disease, Lumbar facet arthropathy, and Spinal stenosis, lumbar region, with neurogenic claudication were also pertinent to this visit.  Visit Diagnosis  (New problems to examiner): 1. Chronic pain syndrome   2. Chronic joint pain   3. Chronic bilateral low back pain with bilateral sciatica   4. Marijuana use   5. Fibromyalgia   6. Lumbar degenerative disc disease   7. Lumbar facet arthropathy  8. Spinal stenosis, lumbar region, with neurogenic claudication    Plan of Care (Initial workup plan)  Note: Ms. Marinello was reminded that as per protocol, today's visit has been an evaluation only. We have not taken over the patient's controlled substance management.  General Recommendations: The pain condition that the patient suffers from is best treated with a multidisciplinary approach that involves an increase in physical activity to prevent de-conditioning and worsening of the pain cycle, as well as psychological counseling (formal and/or informal) to address the co-morbid psychological affects of pain. Treatment will often involve judicious use of pain medications and interventional procedures to decrease the pain, allowing the patient to participate in the physical activity that will ultimately produce long-lasting pain reductions. The goal of the multidisciplinary approach is to return the patient to a higher level of overall function and to restore their ability to perform activities of daily living.  1.  Discussed evidence-based strategies for fibromyalgia which include physical therapy/aquatic therapy, dietary modification, reduction in red meat intake, hydration.  Patient is on gabapentin 400 mg 3 times a day.  We discussed adding Cymbalta at 20 mg daily for the first 4 weeks and then increasing to 40 mg to assist with fibromyalgia.  Patient was provided education on fibromyalgia.  2.  Discussed THC intake with patient.  Informed patient that she would have to refrain from [facility] and provide appropriate urine toxicology screen as well as psych clearance before we consider chronic opioid therapy.  I informed her that if this were a consideration, we  would only consider controlled 3 medications such as tramadol, buprenorphine.  3.  Referral to physical therapy as below.  4.  Consider interventional options for, diagnostic lumbar facet medial branch nerve blocks for low back pain related to lumbar facet arthropathy, lumbar DDD   Lab Orders     Compliance Drug Analysis, Ur  Referral Orders     Ambulatory referral to Psychiatry     Ambulatory referral to Physical Therapy Pharmacotherapy (current): Medications ordered:  Meds ordered this encounter  Medications  . DULoxetine (CYMBALTA) 20 MG capsule    Sig: Take 1 capsule (20 mg total) by mouth daily for 30 days, THEN 2 capsules (40 mg total) daily.    Dispense:  90 capsule    Refill:  0   Medications administered during this visit: Ople Rushing had no medications administered during this visit.   Pharmacological management options:  Opioid Analgesics: The patient was informed that there is no guarantee that she would be a candidate for opioid analgesics. The decision will be made following CDC guidelines. This decision will be based on the results of diagnostic studies, as well as Ms. Goetting's risk profile.  Tramadol, buprenorphine  Membrane stabilizer: Currently on gabapentin for 100 mg 3 times daily, managed by Dr. Posey Pronto with rheumatology  Muscle relaxant: Has tried Robaxin and baclofen before  NSAID: Medically contraindicated on Brilinta  Other analgesic(s): To be determined at a later time   Interventional management options: Ms. Bergthold was informed that there is no guarantee that she would be a candidate for interventional therapies. The decision will be based on the results of diagnostic studies, as well as Ms. Withey's risk profile.  Procedure(s) under consideration:  Diagnostic lumbar facet medial branch nerve blocks   Provider-requested follow-up: Return in about 8 weeks (around 12/04/2020) for Medication Management, in person.  Future Appointments  Date Time  Provider [facility]  11/27/2020  9:40 AM Gillis Santa, MD [facility] None  Note by: Gillis Santa, MD Date: 10/09/2020; Time: 9:29 AM

## 2020-10-09 NOTE — Progress Notes (Signed)
Safety precautions to be maintained throughout the outpatient stay will include: orient to surroundings, keep bed in low position, maintain call bell within reach at all times, provide assistance with transfer out of bed and ambulation.  

## 2020-10-10 ENCOUNTER — Telehealth: Payer: Self-pay

## 2020-10-10 NOTE — Telephone Encounter (Signed)
Spoke with patient about medication, she states it gives her "racey thoughts".  States she is too fuzzy and can't think clearly.  I told the patient not to take anymore.  I asked why she was taking and she was not clear on the reason for the medication.

## 2020-10-10 NOTE — Telephone Encounter (Signed)
She said Dr. Cherylann Ratel gave her cymbalta yesterday and she took one and doesn't like the way it makes her feel. She doesn't want to take it.

## 2020-10-11 ENCOUNTER — Telehealth: Payer: Self-pay | Admitting: Student in an Organized Health Care Education/Training Program

## 2020-10-11 NOTE — Telephone Encounter (Signed)
Patient notified of above.  Spelled medication for patient and she states she will get it if it dont cost too much.

## 2020-10-11 NOTE — Telephone Encounter (Signed)
Patient is having reaction to new medications / please call asap

## 2020-10-11 NOTE — Telephone Encounter (Signed)
Spoke with patient and she states the Cymbalta is giving her racing thoughts and making her head feel funny.  Instructed patient not to take it anymore until I spoke with you to see your recommendations.

## 2020-10-12 ENCOUNTER — Telehealth: Payer: Self-pay

## 2020-10-12 NOTE — Telephone Encounter (Signed)
The device manager called back and explained to me that the patient is already deactivated from their system. He gave me the Atrial and Venticular lead serial numbers as well.   I called Adam from [employer] and he is going to activate the patient in our [employer] system.

## 2020-10-12 NOTE — Telephone Encounter (Signed)
Pt asks can the MD send over medication that her insurance will pay for because she is in a lot of pain, she do not have money to pay out of pocket for her medication or OTC medication

## 2020-10-12 NOTE — Telephone Encounter (Signed)
The patient is in our system but has never transmitted. I will call her to see if she still have the monitor and ask her to plug it in. Once she plugs the monitor in she will need an appointment to reset the remote monitor for [place] to get a transmission.

## 2020-10-12 NOTE — Telephone Encounter (Signed)
Please schedule a virtual visit with this patient to discuss medication changes with Dr Cherylann Ratel.

## 2020-10-12 NOTE — Telephone Encounter (Signed)
I called [facility] and they states the patient was last seen by Dr. Dierdre Highman. 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 The receptionist states she will send a message to Dr. Dierdre Highman nurse to release the patient in [employer]. Dr. Dierdre Highman phone number is [phone].

## 2020-10-15 ENCOUNTER — Telehealth: Payer: Self-pay

## 2020-10-15 NOTE — Telephone Encounter (Signed)
[employer] transmission received this monring showing AT/ AF for 16 seconds.  No documented history of AF, not on OAC.  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  This is first transmission we have received for this patient.

## 2020-10-16 LAB — COMPLIANCE DRUG ANALYSIS, UR

## 2020-10-17 ENCOUNTER — Encounter: Payer: Self-pay | Admitting: Student in an Organized Health Care Education/Training Program

## 2020-10-18 ENCOUNTER — Other Ambulatory Visit: Payer: Self-pay

## 2020-10-18 ENCOUNTER — Encounter: Payer: Self-pay | Admitting: Student in an Organized Health Care Education/Training Program

## 2020-10-18 ENCOUNTER — Ambulatory Visit
Payer: 59 | Attending: Student in an Organized Health Care Education/Training Program | Admitting: Student in an Organized Health Care Education/Training Program

## 2020-10-18 VITALS — BP 164/91 | HR 66 | Temp 97.1°F | Ht 62.0 in | Wt 138.0 lb

## 2020-10-18 DIAGNOSIS — M5416 Radiculopathy, lumbar region: Secondary | ICD-10-CM

## 2020-10-18 DIAGNOSIS — M48062 Spinal stenosis, lumbar region with neurogenic claudication: Secondary | ICD-10-CM

## 2020-10-18 DIAGNOSIS — G8929 Other chronic pain: Secondary | ICD-10-CM

## 2020-10-18 DIAGNOSIS — M47816 Spondylosis without myelopathy or radiculopathy, lumbar region: Secondary | ICD-10-CM

## 2020-10-18 DIAGNOSIS — G894 Chronic pain syndrome: Secondary | ICD-10-CM | POA: Diagnosis not present

## 2020-10-18 MED ORDER — HYDROCODONE-ACETAMINOPHEN 5-325 MG PO TABS: 1.0000 | ORAL_TABLET | Freq: Two times a day (BID) | ORAL | 0 refills | Status: DC | PRN

## 2020-10-18 MED ORDER — PREGABALIN 50 MG PO CAPS: ORAL_CAPSULE | ORAL | 0 refills | Status: DC

## 2020-10-18 NOTE — Patient Instructions (Addendum)
1 sign pain contract 2. Follow up in 4 weeksGENERAL RISKS AND COMPLICATIONS  What are the risk, side effects and possible complications? Generally speaking, most procedures are safe.  However, with any procedure there are risks, side effects, and the possibility of complications.  The risks and complications are dependent upon the sites that are lesioned, or the type of nerve block to be performed.  The closer the procedure is to the spine, the more serious the risks are.  Great care is taken when placing the radio frequency needles, block needles or lesioning probes, but sometimes complications can occur. 1. Infection: Any time there is an injection through the skin, there is a risk of infection.  This is why sterile conditions are used for these blocks.  There are four possible types of infection. 1. Localized skin infection. 2. Central Nervous System Infection-This can be in the form of Meningitis, which can be deadly. 3. Epidural Infections-This can be in the form of an epidural abscess, which can cause pressure inside of the spine, causing compression of the spinal cord with subsequent paralysis. This would require an emergency surgery to decompress, and there are no guarantees that the patient would recover from the paralysis. 4. Discitis-This is an infection of the intervertebral discs.  It occurs in about 1% of discography procedures.  It is difficult to treat and it may lead to surgery.        2. Pain: the needles have to go through skin and soft tissues, will cause soreness.       3. Damage to internal structures:  The nerves to be lesioned may be near blood vessels or    other nerves which can be potentially damaged.       4. Bleeding: Bleeding is more common if the patient is taking blood thinners such as  aspirin, Coumadin, Ticiid, Plavix, etc., or if he/she have some genetic predisposition  such as hemophilia. Bleeding into the spinal canal can cause compression of the spinal  cord with  subsequent paralysis.  This would require an emergency surgery to  decompress and there are no guarantees that the patient would recover from the  paralysis.       5. Pneumothorax:  Puncturing of a lung is a possibility, every time a needle is introduced in  the area of the chest or upper back.  Pneumothorax refers to free air around the  collapsed lung(s), inside of the thoracic cavity (chest cavity).  Another two possible  complications related to a similar event would include: Hemothorax and Chylothorax.   These are variations of the Pneumothorax, where instead of air around the collapsed  lung(s), you may have blood or chyle, respectively.       6. Spinal headaches: They may occur with any procedures in the area of the spine.       7. Persistent CSF (Cerebro-Spinal Fluid) leakage: This is a rare problem, but may occur  with prolonged intrathecal or epidural catheters either due to the formation of a fistulous  track or a dural tear.       8. Nerve damage: By working so close to the spinal cord, there is always a possibility of  nerve damage, which could be as serious as a permanent spinal cord injury with  paralysis.       9. Death:  Although rare, severe deadly allergic reactions known as "Anaphylactic  reaction" can occur to any of the medications used.      10. Worsening of  the symptoms:  We can always make thing worse.  What are the chances of something like this happening? Chances of any of this occuring are extremely low.  By statistics, you have more of a chance of getting killed in a motor vehicle accident: while driving to the hospital than any of the above occurring .  Nevertheless, you should be aware that they are possibilities.  In general, it is similar to taking a shower.  Everybody knows that you can slip, hit your head and get killed.  Does that mean that you should not shower again?  Nevertheless always keep in mind that statistics do not mean anything if you happen to be on the wrong  side of them.  Even if a procedure has a 1 (one) in a 1,000,000 (million) chance of going wrong, it you happen to be that one..Also, keep in mind that by statistics, you have more of a chance of having something go wrong when taking medications.  Who should not have this procedure? If you are on a blood thinning medication (e.g. Coumadin, Plavix, see list of "Blood Thinners"), or if you have an active infection going on, you should not have the procedure.  If you are taking any blood thinners, please inform your physician.  How should I prepare for this procedure?  Do not eat or drink anything at least six hours prior to the procedure.  Bring a driver with you .  It cannot be a taxi.  Come accompanied by an adult that can drive you back, and that is strong enough to help you if your legs get weak or numb from the local anesthetic.  Take all of your medicines the morning of the procedure with just enough water to swallow them.  If you have diabetes, make sure that you are scheduled to have your procedure done first thing in the morning, whenever possible.  If you have diabetes, take only half of your insulin dose and notify our nurse that you have done so as soon as you arrive at the clinic.  If you are diabetic, but only take blood sugar pills (oral hypoglycemic), then do not take them on the morning of your procedure.  You may take them after you have had the procedure.  Do not take aspirin or any aspirin-containing medications, at least eleven (11) days prior to the procedure.  They may prolong bleeding.  Wear loose fitting clothing that may be easy to take off and that you would not mind if it got stained with Betadine or blood.  Do not wear any jewelry or perfume  Remove any nail coloring.  It will interfere with some of our monitoring equipment.  NOTE: Remember that this is not meant to be interpreted as a complete list of all possible complications.  Unforeseen problems may  occur.  BLOOD THINNERS The following drugs contain aspirin or other products, which can cause increased bleeding during surgery and should not be taken for 2 weeks prior to and 1 week after surgery.  If you should need take something for relief of minor pain, you may take acetaminophen which is found in Tylenol,m Datril, Anacin-3 and Panadol. It is not blood thinner. The products listed below are.  Do not take any of the products listed below in addition to any listed on your instruction sheet.  A.P.C or A.P.C with Codeine Codeine Phosphate Capsules #3 Ibuprofen Ridaura  ABC compound Congesprin Imuran rimadil  Advil Cope Indocin Robaxisal  Alka-Seltzer Effervescent Pain  Reliever and Antacid Coricidin or Coricidin-D  Indomethacin Rufen  Alka-Seltzer plus Cold Medicine Cosprin Ketoprofen S-A-C Tablets  Anacin Analgesic Tablets or Capsules Coumadin Korlgesic Salflex  Anacin Extra Strength Analgesic tablets or capsules CP-2 Tablets Lanoril Salicylate  Anaprox Cuprimine Capsules Levenox Salocol  Anexsia-D Dalteparin Magan Salsalate  Anodynos Darvon compound Magnesium Salicylate Sine-off  Ansaid Dasin Capsules Magsal Sodium Salicylate  Anturane Depen Capsules Marnal Soma  APF Arthritis pain formula Dewitt's Pills Measurin Stanback  Argesic Dia-Gesic Meclofenamic Sulfinpyrazone  Arthritis Bayer Timed Release Aspirin Diclofenac Meclomen Sulindac  Arthritis pain formula Anacin Dicumarol Medipren Supac  Analgesic (Safety coated) Arthralgen Diffunasal Mefanamic Suprofen  Arthritis Strength Bufferin Dihydrocodeine Mepro Compound Suprol  Arthropan liquid Dopirydamole Methcarbomol with Aspirin Synalgos  ASA tablets/Enseals Disalcid Micrainin Tagament  Ascriptin Doan's Midol Talwin  Ascriptin A/D Dolene Mobidin Tanderil  Ascriptin Extra Strength Dolobid Moblgesic Ticlid  Ascriptin with Codeine Doloprin or Doloprin with Codeine Momentum Tolectin  Asperbuf Duoprin Mono-gesic Trendar  Aspergum Duradyne  Motrin or Motrin IB Triminicin  Aspirin plain, buffered or enteric coated Durasal Myochrisine Trigesic  Aspirin Suppositories Easprin Nalfon Trillsate  Aspirin with Codeine Ecotrin Regular or Extra Strength Naprosyn Uracel  Atromid-S Efficin Naproxen Ursinus  Auranofin Capsules Elmiron Neocylate Vanquish  Axotal Emagrin Norgesic Verin  Azathioprine Empirin or Empirin with Codeine Normiflo Vitamin E  Azolid Emprazil Nuprin Voltaren  Bayer Aspirin plain, buffered or children's or timed BC Tablets or powders Encaprin Orgaran Warfarin Sodium  Buff-a-Comp Enoxaparin Orudis Zorpin  Buff-a-Comp with Codeine Equegesic Os-Cal-Gesic   Buffaprin Excedrin plain, buffered or Extra Strength Oxalid   Bufferin Arthritis Strength Feldene Oxphenbutazone   Bufferin plain or Extra Strength Feldene Capsules Oxycodone with Aspirin   Bufferin with Codeine Fenoprofen Fenoprofen Pabalate or Pabalate-SF   Buffets II Flogesic Panagesic   Buffinol plain or Extra Strength Florinal or Florinal with Codeine Panwarfarin   Buf-Tabs Flurbiprofen Penicillamine   Butalbital Compound Four-way cold tablets Penicillin   Butazolidin Fragmin Pepto-Bismol   Carbenicillin Geminisyn Percodan   Carna Arthritis Reliever Geopen Persantine   Carprofen Gold's salt Persistin   Chloramphenicol Goody's Phenylbutazone   Chloromycetin Haltrain Piroxlcam   Clmetidine heparin Plaquenil   Cllnoril Hyco-pap Ponstel   Clofibrate Hydroxy chloroquine Propoxyphen         Before stopping any of these medications, be sure to consult the physician who ordered them.  Some, such as Coumadin (Warfarin) are ordered to prevent or treat serious conditions such as "deep thrombosis", "pumonary embolisms", and other heart problems.  The amount of time that you may need off of the medication may also vary with the medication and the reason for which you were taking it.  If you are taking any of these medications, please make sure you notify your pain  physician before you undergo any procedures.         Pain Management Discharge Instructions  General Discharge Instructions :  If you need to reach your doctor call: Monday-Friday 8:00 am - 4:00 pm at [phone] or toll free [phone].  After clinic hours [phone] to have operator reach doctor.  Bring all of your medication bottles to all your appointments in the pain clinic.  To cancel or reschedule your appointment with Pain Management please remember to call 24 hours in advance to avoid a fee.  Refer to the educational materials which you have been given on: General Risks, I had my Procedure. Discharge Instructions, Post Sedation.  Post Procedure Instructions:  The drugs you were given will stay in your system  until tomorrow, so for the next 24 hours you should not drive, make any legal decisions or drink any alcoholic beverages.  You may eat anything you prefer, but it is better to start with liquids then soups and crackers, and gradually work up to solid foods.  Please notify your doctor immediately if you have any unusual bleeding, trouble breathing or pain that is not related to your normal pain.  Depending on the type of procedure that was done, some parts of your body may feel week and/or numb.  This usually clears up by tonight or the next day.  Walk with the use of an assistive device or accompanied by an adult for the 24 hours.  You may use ice on the affected area for the first 24 hours.  Put ice in a Ziploc bag and cover with a towel and place against area 15 minutes on 15 minutes off.  You may switch to heat after 24 hours.Epidural Steroid Injection Patient Information  Description: The epidural space surrounds the nerves as they exit the spinal cord.  In some patients, the nerves can be compressed and inflamed by a bulging disc or a tight spinal canal (spinal stenosis).  By injecting steroids into the epidural space, we can bring irritated nerves into  direct contact with a potentially helpful medication.  These steroids act directly on the irritated nerves and can reduce swelling and inflammation which often leads to decreased pain.  Epidural steroids may be injected anywhere along the spine and from the neck to the low back depending upon the location of your pain.   After numbing the skin with local anesthetic (like Novocaine), a small needle is passed into the epidural space slowly.  You may experience a sensation of pressure while this is being done.  The entire block usually last less than 10 minutes.  Conditions which may be treated by epidural steroids:   Low back and leg pain  Neck and arm pain  Spinal stenosis  Post-laminectomy syndrome  Herpes zoster (shingles) pain  Pain from compression fractures  Preparation for the injection:  1. Do not eat any solid food or dairy products within 8 hours of your appointment.  2. You may drink clear liquids up to 3 hours before appointment.  Clear liquids include water, black coffee, juice or soda.  No milk or cream please. 3. You may take your regular medication, including pain medications, with a sip of water before your appointment  Diabetics should hold regular insulin (if taken separately) and take 1/2 normal NPH dos the morning of the procedure.  Carry some sugar containing items with you to your appointment. 4. A driver must accompany you and be prepared to drive you home after your procedure.  5. Bring all your current medications with your. 6. An IV may be inserted and sedation may be given at the discretion of the physician.   7. A blood pressure cuff, EKG and other monitors will often be applied during the procedure.  Some patients may need to have extra oxygen administered for a short period. 8. You will be asked to provide medical information, including your allergies, prior to the procedure.  We must know immediately if you are taking blood thinners (like Coumadin/Warfarin)   Or if you are allergic to IV iodine contrast (dye). We must know if you could possible be pregnant.  Possible side-effects:  Bleeding from needle site  Infection (rare, may require surgery)  Nerve injury (rare)  Numbness & tingling (  temporary)  Difficulty urinating (rare, temporary)  Spinal headache ( a headache worse with upright posture)  Light -headedness (temporary)  Pain at injection site (several days)  Decreased blood pressure (temporary)  Weakness in arm/leg (temporary)  Pressure sensation in back/neck (temporary)  Call if you experience:  Fever/chills associated with headache or increased back/neck pain.  Headache worsened by an upright position.  New onset weakness or numbness of an extremity below the injection site  Hives or difficulty breathing (go to the emergency room)  Inflammation or drainage at the infection site  Severe back/neck pain  Any new symptoms which are concerning to you  Please note:  Although the local anesthetic injected can often make your back or neck feel good for several hours after the injection, the pain will likely return.  It takes 3-7 days for steroids to work in the epidural space.  You may not notice any pain relief for at least that one week.  If effective, we will often do a series of three injections spaced 3-6 weeks apart to maximally decrease your pain.  After the initial series, we generally will wait several months before considering a repeat injection of the same type.  If you have any questions, please call [phone] [facility]

## 2020-10-18 NOTE — Progress Notes (Signed)
PROVIDER NOTE: Information contained herein reflects review and annotations entered in association with encounter. Interpretation of such information and data should be left to medically-trained personnel. Information provided to patient can be located elsewhere in the medical record under "Patient Instructions". Document created using STT-dictation technology, any transcriptional errors that may result from process are unintentional.    Patient: Kari Hughes  Service Category: E/M  Provider: Gillis Santa, MD  DOB: 06/10/1951  DOS: 10/18/2020  Specialty: Interventional Pain Management  MRN: [id number]  Setting: Ambulatory outpatient  PCP: Gladstone Lighter, MD  Type: Established Patient    Referring Provider: Gladstone Lighter, MD  Location: Office  Delivery: Face-to-face     HPI  Kari Hughes, a 70 y.o. year old female, is here today because of her Chronic pain syndrome [G89.4]. Kari Hughes primary complain today is Neck Pain Last encounter: My last encounter with her was on 10/11/2020. Pertinent problems: Kari Hughes has Fibromyalgia; Chronic pain syndrome; Bipolar disorder ([place]); Nerve pain; Lumbar degenerative disc disease; Lumbar facet arthropathy; and Spinal stenosis, lumbar region, with neurogenic claudication on their pertinent problem list. Pain Assessment: Severity of Chronic pain is reported as a 10-Worst pain ever/10. Location: Neck (pain is main on her right side) Right/pain radiaties down her neck, shoulder, arms, legs, finger and toes and buttock. Onset: More than a month ago. Quality: Sharp,Shooting,Aching,Tingling,Numbness,Pounding. Timing: Constant. Modifying factor(s): meds and lay down. Vitals:  height is '5\' 2"'  (1.575 m) and weight is 138 lb (62.6 kg). Her temperature is 97.1 F (36.2 C) (abnormal). Her blood pressure is 164/91 (abnormal) and her pulse is 66. Her oxygen saturation is 97%.   Reason for encounter: medication management.    Patient presents today for  medication management.  She was started on Cymbalta at 20 mg at her last visit however after day 3 she discontinue this medication due to side effects of increased anxiety and uneasiness.  She states that she developed a tremor.  That improved after she discontinued the medication.  She states that she has not been contacted by the psychiatrist for depression management.  I will have our scheduling staff reach out to their office to see what the update of the referral is.  Patient has completed her urine toxicology screen below which is appropriate.  We discussed low-dose hydrocodone to be used only for breakthrough pain as well as starting Lyrica.  Risks and benefits reviewed and patient would like to proceed.  Patient has severe lumbar spinal stenosis as well as bilateral neuroforaminal stenosis at L3-L4 and L4-L5.  This is present on her CT scan that was done earlier this year.  We discussed performing a lumbar epidural steroid injection for her symptoms.  Patient states that she will think about this further and give [place] a call if she would like to proceed with this.  Otherwise I will see the patient back in approximately 6 to 8 weeks.  Pharmacotherapy Assessment   Analgesic: Hydrocodone 5 mg twice daily as needed, quantity 60/month; MME equals 10    Monitoring: [place] PMP: PDMP reviewed during this encounter.       Pharmacotherapy: No side-effects or adverse reactions reported. Compliance: No problems identified. Effectiveness: Clinically acceptable.  Chauncey Fischer, RN  10/18/2020  1:06 PM  Sign when Signing Visit Nursing Pain Medication Assessment:  Safety precautions to be maintained throughout the outpatient stay will include: orient to surroundings, keep bed in low position, maintain call bell within reach at all times, provide assistance with transfer out of  bed and ambulation.  Medication Inspection Compliance: Pill count conducted under aseptic conditions, in front of the patient. Neither the  pills nor the bottle was removed from the patient's sight at any time. Once count was completed pills were immediately returned to the patient in their original bottle.  Medication: Tramadol (Ultram) Pill/Patch Count: 15 of 60 pills remain Pill/Patch Appearance: Markings consistent with prescribed medication Bottle Appearance: Standard pharmacy container. Clearly labeled. Filled Date: 3 / 4 / 22 Last Medication intake:  YesterdaySafety precautions to be maintained throughout the outpatient stay will include: orient to surroundings, keep bed in low position, maintain call bell within reach at all times, provide assistance with transfer out of bed and ambulation.     UDS:  Summary  Date Value Ref Range Status  10/09/2020 Note  Final    Comment:    ==================================================================== Compliance Drug Analysis, Ur ==================================================================== Test                             Result       Flag       Units  Drug Present and Declared for Prescription Verification   Tramadol                       >3425        EXPECTED   ng/mg creat   O-Desmethyltramadol            2040         EXPECTED   ng/mg creat   N-Desmethyltramadol            1410         EXPECTED   ng/mg creat    Source of tramadol is a prescription medication. O-desmethyltramadol    and N-desmethyltramadol are expected metabolites of tramadol.    Gabapentin                     PRESENT      EXPECTED   Methocarbamol                  PRESENT      EXPECTED   Guaifenesin                    PRESENT      EXPECTED    Guaifenesin may be administered as an over-the-counter or    prescription drug; it may also be present as a breakdown product of    methocarbamol.    Diphenhydramine                PRESENT      EXPECTED  Drug Present not Declared for Prescription Verification   Acetaminophen                  PRESENT      UNEXPECTED  Drug Absent but Declared for  Prescription Verification   Duloxetine                     Not Detected UNEXPECTED ==================================================================== Test                      Result    Flag   Units      Ref Range   Creatinine              146  mg/dL      >=20 ==================================================================== Declared Medications:  The flagging and interpretation on this report are based on the  following declared medications.  Unexpected results may arise from  inaccuracies in the declared medications.   **Note: The testing scope of this panel includes these medications:   Diphenhydramine (Benadryl)  Duloxetine (Cymbalta)  Gabapentin (Neurontin)  Methocarbamol (Robaxin)  Tramadol (Ultram)   **Note: The testing scope of this panel does not include the  following reported medications:   Amlodipine (Norvasc)  Ezetimibe (Zetia)  Loperamide (Imodium)  Ticagrelor (Brilinta) ==================================================================== For clinical consultation, please call [phone]. ====================================================================      ROS  Constitutional: Denies any fever or chills Gastrointestinal: No reported hemesis, hematochezia, vomiting, or acute GI distress Musculoskeletal: Diffuse arthralgias and myalgias Neurological: No reported episodes of acute onset apraxia, aphasia, dysarthria, agnosia, amnesia, paralysis, loss of coordination, or loss of consciousness  Medication Review  Alpha-Lipoic Acid, HYDROcodone-acetaminophen, Melatonin, acetaminophen, amLODipine, ezetimibe, gabapentin, loperamide, methocarbamol, pregabalin, ticagrelor, and traMADol  History Review  Allergy: Kari Hughes has No Known Allergies. Drug: Kari Hughes  reports previous drug use. Alcohol:  reports previous alcohol use. Tobacco:  reports that she has been smoking. She has never used smokeless tobacco. Social: Kari Hughes  reports that  she has been smoking. She has never used smokeless tobacco. She reports previous alcohol use. She reports previous drug use. Medical:  has a past medical history of Hypertension. Surgical: Kari Hughes  has a past surgical history that includes pacemaker placement (2019) and Joint replacement (Left, 2013). Family: family history is not on file.  Laboratory Chemistry Profile   Renal Lab Results  Component Value Date   BUN 23 07/01/2020   CREATININE 0.95 07/01/2020   BCR 21 05/08/2020   GFRAA 73 05/08/2020   GFRNONAA >60 07/01/2020     Hepatic Lab Results  Component Value Date   AST 23 07/01/2020   ALT 20 07/01/2020   ALBUMIN 3.8 07/01/2020   ALKPHOS 56 07/01/2020     Electrolytes Lab Results  Component Value Date   NA 142 07/01/2020   K 3.7 07/01/2020   CL 109 07/01/2020   CALCIUM 9.2 07/01/2020     Bone No results found for: VD25OH, VD125OH2TOT, YI9485IO2, VO3500XF8, 25OHVITD1, 25OHVITD2, 25OHVITD3, TESTOFREE, TESTOSTERONE   Inflammation (CRP: Acute Phase) (ESR: Chronic Phase) No results found for: CRP, ESRSEDRATE, LATICACIDVEN     Note: Above Lab results reviewed.  Recent Imaging Review  CT Lumbar Spine Wo Contrast CLINICAL DATA:  Low back pain. Right leg weakness. No known injury.  EXAM: CT LUMBAR SPINE WITHOUT CONTRAST  TECHNIQUE: Multidetector CT imaging of the lumbar spine was performed without intravenous contrast administration. Multiplanar CT image reconstructions were also generated.  COMPARISON:  Plain films lumbar spine today.  FINDINGS: Segmentation: Standard.  Alignment: Facet degenerative disease results in 0.5 cm anterolisthesis L5 on S1.  Vertebrae: No acute fracture or focal pathologic process. Degenerative endplate sclerosis is most notable at L3-4.  Paraspinal and other soft tissues: Aortic atherosclerosis and sigmoid diverticulosis are seen.  Disc levels: T11-12: Mild-to-moderate facet arthropathy and a minimal disc bulge. No  stenosis.  T12-L1: Shallow disc bulge and mild facet arthropathy.  No stenosis.  L1-2: Vacuum disc phenomenon, mild loss of disc space height and a bulge eccentric to the left. The central canal and foramina are open.  L2-3: Shallow disc bulge and vacuum disc phenomenon. Mild central canal and bilateral foraminal narrowing.  L3-4: Vacuum disc phenomenon, disc bulge and ligamentum flavum thickening.  Mild facet degenerative disease. There is mild to moderate central canal stenosis. Moderate to moderately severe foraminal narrowing is worse on the left.  L4-5: Moderate facet arthropathy. Disc bulge and vacuum disc phenomenon. The central canal is open. Moderate to moderately severe foraminal narrowing is worse on the left.  L5-S1: Severe facet degenerative change on the right and mild-to-moderate facet arthropathy on the left. The disc is uncovered with a shallow bulge. The central canal appears open. Disc and facet arthropathy cause severe right and moderately severe left foraminal narrowing.  IMPRESSION: No acute abnormality.  Mild to moderate central canal stenosis at L3-4 where moderate to moderately severe bilateral foraminal narrowing is worse on the left.  Moderate to moderately severe bilateral foraminal narrowing at L4-5 is worse on the left. The central canal is open.  Severe right and moderately severe left foraminal narrowing at L5-S1 due to disc and right much worse than left facet degenerative disease.  Diverticulosis.  Aortic Atherosclerosis (ICD10-I70.0).  Electronically Signed   By: Inge Rise M.D.   On: 03/23/2020 12:15 DG Lumbar Spine 2-3 Views CLINICAL DATA:  Right leg weakness  EXAM: LUMBAR SPINE - 2-3 VIEW  COMPARISON:  None.  FINDINGS: Grade 1 anterolisthesis at L5-S1. Vertebral body heights are maintained apart from mild degenerative endplate irregularity. There is diffuse disc space narrowing and vacuum disc phenomenon.  Small posterior endplate osteophytes are present. There is multilevel facet hypertrophy.  IMPRESSION: Degenerative disc disease and facet hypertrophy throughout.  Electronically Signed   By: Macy Mis M.D.   On: 03/23/2020 11:12 DG Femur Min 2 Views Right CLINICAL DATA:  70 year old female with right leg weakness x2 weeks. No known injury.  EXAM: RIGHT FEMUR 2 VIEWS  COMPARISON:  None.  FINDINGS: Bone mineralization is within normal limits. Right femoral head normally located. Visible right hemipelvis intact. Right SI joint appears normal. There is no evidence of fracture or other focal bone lesions. No acute osseous abnormality identified. There is some tricompartmental degenerative joint space loss at the right knee. There is some right femoral artery calcified peripheral vascular disease.  IMPRESSION: No acute osseous abnormality identified. Right femoral artery calcified atherosclerosis.  Electronically Signed   By: Genevie Ann M.D.   On: 03/23/2020 11:11 Note: Reviewed        Physical Exam  General appearance: Well nourished, well developed, and well hydrated. In no apparent acute distress Mental status: Alert, oriented x 3 (person, place, & time)       Respiratory: No evidence of acute respiratory distress Eyes: PERLA Vitals: BP (!) 164/91   Pulse 66   Temp (!) 97.1 F (36.2 C)   Ht '5\' 2"'  (1.575 m)   Wt 138 lb (62.6 kg)   SpO2 97%   BMI 25.24 kg/m  BMI: Estimated body mass index is 25.24 kg/m as calculated from the following:   Height as of this encounter: '5\' 2"'  (1.575 m).   Weight as of this encounter: 138 lb (62.6 kg). Ideal: Ideal body weight: 50.1 kg (110 lb 7.2 oz) Adjusted ideal body weight: 55.1 kg (121 lb 7.5 oz)   Lumbar Exam  Skin & Axial Inspection: No masses, redness, or swelling Alignment: Asymmetric Functional ROM: Pain restricted ROM affecting both sides Stability: No instability detected Muscle Tone/Strength: Functionally intact.  No obvious neuro-muscular anomalies detected. Sensory (Neurological): Musculoskeletal pain pattern and dermatomal Positive straight leg raise test bilaterally  Gait & Posture Assessment  Ambulation: Patient came in today in a wheel chair Gait: Significantly  limited. Dependent on assistive device to ambulate Posture: Difficulty standing up straight, due to pain   Lower Extremity Exam    Side: Right lower extremity  Side: Left lower extremity  Stability: No instability observed          Stability: No instability observed          Skin & Extremity Inspection: Skin color, temperature, and hair growth are WNL. No peripheral edema or cyanosis. No masses, redness, swelling, asymmetry, or associated skin lesions. No contractures.  Skin & Extremity Inspection: Skin color, temperature, and hair growth are WNL. No peripheral edema or cyanosis. No masses, redness, swelling, asymmetry, or associated skin lesions. No contractures.  Functional ROM: Pain restricted ROM for hip and knee joints          Functional ROM: Pain restricted ROM for hip and knee joints          Muscle Tone/Strength: Functionally intact. No obvious neuro-muscular anomalies detected.  Muscle Tone/Strength: Functionally intact. No obvious neuro-muscular anomalies detected.  Sensory (Neurological): Musculoskeletal pain pattern, neurogenic        Sensory (Neurological): Musculoskeletal pain pattern, neurogenic         Assessment   Status Diagnosis  Persistent Persistent Persistent 1. Chronic pain syndrome   2. Spinal stenosis, lumbar region, with neurogenic claudication   3. Chronic radicular lumbar pain   4. Lumbar radiculopathy   5. Lumbar facet arthropathy      Updated Problems: Problem  Lumbar Degenerative Disc Disease  Lumbar Facet Arthropathy  Spinal Stenosis, Lumbar Region, With Neurogenic Claudication  Bipolar Disorder ([facility])  Nerve Pain  Fibromyalgia  Chronic Pain Syndrome    Plan of Care  Kari Hughes has a current medication list which includes the following long-term medication(s): amlodipine, ezetimibe, gabapentin, and pregabalin.  Pharmacotherapy (Medications Ordered): Meds ordered this encounter  Medications  . pregabalin (LYRICA) 50 MG capsule    Sig: Take 1 capsule (50 mg total) by mouth at bedtime for 10 days, THEN 1 capsule (50 mg total) 2 (two) times daily for 10 days, THEN 1 capsule (50 mg total) 3 (three) times daily.    Dispense:  150 capsule    Refill:  0    Fill one day early if pharmacy is closed on scheduled refill date. May substitute for generic if available.  Marland Kitchen HYDROcodone-acetaminophen (NORCO/VICODIN) 5-325 MG tablet    Sig: Take 1 tablet by mouth 2 (two) times daily as needed for severe pain. Must last 30 days.    Dispense:  60 tablet    Refill:  0    Chronic Pain. (STOP Act - Not applicable). Fill one day early if closed on scheduled refill date.   Orders:  Orders Placed This Encounter  Procedures  . Caudal Epidural Injection    Standing Status:   Standing    Number of Occurrences:   1    Standing Expiration Date:   10/18/2021    Scheduling Instructions:     Laterality: Midline     Level(s): Sacrococcygeal canal (Tailbone area)     Sedation: Patient's choice      TIMEFRAME: PRN procedure. (Kari Hughes will call when needed.)    Order Specific Question:   Where will this procedure be performed?    Answer:   [facility]   Follow-up plan:   Return in about 4 weeks (around 11/15/2020) for Medication Management, in person.    Recent Visits Date Type Provider Dept  10/09/20 Office Visit Gillis Santa, MD  [facility]  Showing recent visits within past 90 days and meeting all other requirements Today's Visits Date Type Provider Dept  10/18/20 Telemedicine Gillis Santa, MD [facility]  Showing today's visits and meeting all other requirements Future Appointments Date Type Provider Dept  11/27/20 Appointment Gillis Santa, MD  [facility]  Showing future appointments within next 90 days and meeting all other requirements  I discussed the assessment and treatment plan with the patient. The patient was provided an opportunity to ask questions and all were answered. The patient agreed with the plan and demonstrated an understanding of the instructions.  Patient advised to call back or seek an in-person evaluation if the symptoms or condition worsens.  Duration of encounter: 30 minutes.  Note by: Gillis Santa, MD Date: 10/18/2020; Time: 3:16 PM

## 2020-10-18 NOTE — Progress Notes (Signed)
Nursing Pain Medication Assessment:  Safety precautions to be maintained throughout the outpatient stay will include: orient to surroundings, keep bed in low position, maintain call bell within reach at all times, provide assistance with transfer out of bed and ambulation.  Medication Inspection Compliance: Pill count conducted under aseptic conditions, in front of the patient. Neither the pills nor the bottle was removed from the patient's sight at any time. Once count was completed pills were immediately returned to the patient in their original bottle.  Medication: Tramadol (Ultram) Pill/Patch Count: 15 of 60 pills remain Pill/Patch Appearance: Markings consistent with prescribed medication Bottle Appearance: Standard pharmacy container. Clearly labeled. Filled Date: 3 / 4 / 22 Last Medication intake:  YesterdaySafety precautions to be maintained throughout the outpatient stay will include: orient to surroundings, keep bed in low position, maintain call bell within reach at all times, provide assistance with transfer out of bed and ambulation.

## 2020-11-05 ENCOUNTER — Telehealth: Payer: Self-pay | Admitting: Student in an Organized Health Care Education/Training Program

## 2020-11-05 NOTE — Telephone Encounter (Signed)
Patient called to schedule Caudal EPI. I gave her appt for 11-14-20 at 8:40

## 2020-11-12 ENCOUNTER — Telehealth: Payer: Self-pay

## 2020-11-12 NOTE — Telephone Encounter (Signed)
Pt states she needs her anxiety medication refilled she haven't slept over the weekend since she have ran out

## 2020-11-12 NOTE — Telephone Encounter (Signed)
Called patient to ask which medication she needs refilled. Message left.

## 2020-11-14 ENCOUNTER — Other Ambulatory Visit: Payer: Self-pay

## 2020-11-14 ENCOUNTER — Ambulatory Visit (HOSPITAL_BASED_OUTPATIENT_CLINIC_OR_DEPARTMENT_OTHER): Payer: 59 | Admitting: Student in an Organized Health Care Education/Training Program

## 2020-11-14 ENCOUNTER — Ambulatory Visit
Admission: RE | Admit: 2020-11-14 | Discharge: 2020-11-14 | Disposition: A | Discharge status: Home / Self Care | Payer: 59 | Source: Ambulatory Visit | Attending: Student in an Organized Health Care Education/Training Program | Admitting: Student in an Organized Health Care Education/Training Program

## 2020-11-14 ENCOUNTER — Encounter: Payer: Self-pay | Admitting: Student in an Organized Health Care Education/Training Program

## 2020-11-14 VITALS — BP 192/97 | HR 72 | Temp 97.2°F | Resp 19 | Ht 62.0 in | Wt 138.0 lb

## 2020-11-14 DIAGNOSIS — G8929 Other chronic pain: Secondary | ICD-10-CM

## 2020-11-14 DIAGNOSIS — M48062 Spinal stenosis, lumbar region with neurogenic claudication: Secondary | ICD-10-CM | POA: Insufficient documentation

## 2020-11-14 DIAGNOSIS — G894 Chronic pain syndrome: Secondary | ICD-10-CM | POA: Insufficient documentation

## 2020-11-14 DIAGNOSIS — M5416 Radiculopathy, lumbar region: Secondary | ICD-10-CM | POA: Insufficient documentation

## 2020-11-14 MED ORDER — IOHEXOL 180 MG/ML  SOLN
10.0000 mL | Freq: Once | INTRAMUSCULAR | Status: AC
  Administered 2020-11-14: 10 mL via EPIDURAL

## 2020-11-14 MED ORDER — LIDOCAINE HCL 2 % IJ SOLN
20.0000 mL | Freq: Once | INTRAMUSCULAR | Status: AC
  Administered 2020-11-14: 400 mg

## 2020-11-14 MED ORDER — SODIUM CHLORIDE (PF) 0.9 % IJ SOLN
INTRAMUSCULAR | Status: AC
  Filled 2020-11-14: qty 10

## 2020-11-14 MED ORDER — ROPIVACAINE HCL 2 MG/ML IJ SOLN
INTRAMUSCULAR | Status: AC
  Filled 2020-11-14: qty 10

## 2020-11-14 MED ORDER — DEXAMETHASONE SODIUM PHOSPHATE 10 MG/ML IJ SOLN
INTRAMUSCULAR | Status: AC
  Filled 2020-11-14: qty 1

## 2020-11-14 MED ORDER — LIDOCAINE HCL 2 % IJ SOLN
INTRAMUSCULAR | Status: AC
  Filled 2020-11-14: qty 20

## 2020-11-14 MED ORDER — ROPIVACAINE HCL 2 MG/ML IJ SOLN
2.0000 mL | Freq: Once | INTRAMUSCULAR | Status: AC
  Administered 2020-11-14: 2 mL via EPIDURAL

## 2020-11-14 MED ORDER — CYCLOBENZAPRINE HCL 10 MG PO TABS: 10.0000 mg | ORAL_TABLET | Freq: Three times a day (TID) | ORAL | 0 refills | Status: DC | PRN

## 2020-11-14 MED ORDER — DEXAMETHASONE SODIUM PHOSPHATE 10 MG/ML IJ SOLN
10.0000 mg | Freq: Once | INTRAMUSCULAR | Status: AC
  Administered 2020-11-14: 10 mg

## 2020-11-14 MED ORDER — SODIUM CHLORIDE 0.9% FLUSH
2.0000 mL | Freq: Once | INTRAVENOUS | Status: AC
  Administered 2020-11-14: 2 mL

## 2020-11-14 MED ORDER — HYDROCODONE-ACETAMINOPHEN 5-325 MG PO TABS: 1.0000 | ORAL_TABLET | Freq: Two times a day (BID) | ORAL | 0 refills | Status: DC | PRN

## 2020-11-14 NOTE — Patient Instructions (Signed)

## 2020-11-14 NOTE — Progress Notes (Signed)
PROVIDER NOTE: Information contained herein reflects review and annotations entered in association with encounter. Interpretation of such information and data should be left to medically-trained personnel. Information provided to patient can be located elsewhere in the medical record under "Patient Instructions". Document created using STT-dictation technology, any transcriptional errors that may result from process are unintentional.    Patient: Kari Hughes  Service Category: Procedure  Provider: Edward JollyBilal Navina Wohlers, MD  DOB: 08/16/1950  DOS: 11/14/2020  Location: [facility]  MRN: [id number]  Setting: Ambulatory - outpatient  Referring Provider: Enid BaasKalisetti, Radhika, MD  Type: Established Patient  Specialty: Interventional Pain Management  PCP: Enid BaasKalisetti, Radhika, MD   Primary Reason for Visit: Interventional Pain Management Treatment. CC: Back Pain (low) and Neck Pain  Procedure:          Anesthesia, Analgesia, Anxiolysis:  Type: Diagnostic Epidural Steroid Injection #1  Region: Caudal Level: Sacrococcygeal   Laterality: Midline       Type: Local Anesthesia  Local Anesthetic: Lidocaine 1-2%  Position: Prone   Indications: 1. Spinal stenosis, lumbar region, with neurogenic claudication   2. Chronic radicular lumbar pain   3. Chronic pain syndrome    Pain Score: Pre-procedure: 10-Worst pain ever/10 Post-procedure: 5 /10   Pre-op H&P Assessment:  [name] is a 70 y.o. (year old), female patient, seen today for interventional treatment. She  has a past surgical history that includes pacemaker placement (2019) and Joint replacement (Left, 2013). [name] has a current medication list which includes the following prescription(s): acetaminophen, amlodipine, cyclobenzaprine, ezetimibe, hydroxyzine, loratadine, melatonin, methocarbamol, pregabalin, sertraline, ticagrelor, and hydrocodone-acetaminophen. Her primarily concern today is the Back Pain (low) and Neck Pain  Initial  Vital Signs:  Pulse/HCG Rate: 71  Temp: (!) 97.2 F (36.2 C) Resp: 16 BP: (!) 151/86 SpO2: 100 %  BMI: Estimated body mass index is 25.24 kg/m as calculated from the following:   Height as of this encounter: 5\' 2"  (1.575 m).   Weight as of this encounter: 138 lb (62.6 kg).  Risk Assessment: Allergies: Reviewed. She has No Known Allergies.  Allergy Precautions: None required Coagulopathies: Reviewed. None identified.  Blood-thinner therapy: None at this time Active Infection(s): Reviewed. None identified. [name] is afebrile  Site Confirmation: [name] was asked to confirm the procedure and laterality before marking the site Procedure checklist: Completed Consent: Before the procedure and under the influence of no sedative(s), amnesic(s), or anxiolytics, the patient was informed of the treatment options, risks and possible complications. To fulfill our ethical and legal obligations, as recommended by the American Medical Association's Code of Ethics, I have informed the patient of my clinical impression; the nature and purpose of the treatment or procedure; the risks, benefits, and possible complications of the intervention; the alternatives, including doing nothing; the risk(s) and benefit(s) of the alternative treatment(s) or procedure(s); and the risk(s) and benefit(s) of doing nothing. The patient was provided information about the general risks and possible complications associated with the procedure. These may include, but are not limited to: failure to achieve desired goals, infection, bleeding, organ or nerve damage, allergic reactions, paralysis, and death. In addition, the patient was informed of those risks and complications associated to Spine-related procedures, such as failure to decrease pain; infection (i.e.: Meningitis, epidural or intraspinal abscess); bleeding (i.e.: epidural hematoma, subarachnoid hemorrhage, or any other type of intraspinal or peri-dural bleeding);  organ or nerve damage (i.e.: Any type of peripheral nerve, nerve root, or spinal cord injury) with subsequent damage to sensory, motor,  and/or autonomic systems, resulting in permanent pain, numbness, and/or weakness of one or several areas of the body; allergic reactions; (i.e.: anaphylactic reaction); and/or death. Furthermore, the patient was informed of those risks and complications associated with the medications. These include, but are not limited to: allergic reactions (i.e.: anaphylactic or anaphylactoid reaction(s)); adrenal axis suppression; blood sugar elevation that in diabetics may result in ketoacidosis or comma; water retention that in patients with history of congestive heart failure may result in shortness of breath, pulmonary edema, and decompensation with resultant heart failure; weight gain; swelling or edema; medication-induced neural toxicity; particulate matter embolism and blood vessel occlusion with resultant organ, and/or nervous system infarction; and/or aseptic necrosis of one or more joints. Finally, the patient was informed that Medicine is not an exact science; therefore, there is also the possibility of unforeseen or unpredictable risks and/or possible complications that may result in a catastrophic outcome. The patient indicated having understood very clearly. We have given the patient no guarantees and we have made no promises. Enough time was given to the patient to ask questions, all of which were answered to the patient's satisfaction. [name] has indicated that she wanted to continue with the procedure. Attestation: I, the ordering provider, attest that I have discussed with the patient the benefits, risks, side-effects, alternatives, likelihood of achieving goals, and potential problems during recovery for the procedure that I have provided informed consent. Date  Time: 11/14/2020  8:30 AM  Pre-Procedure Preparation:  Monitoring: As per clinic protocol. Respiration,  ETCO2, SpO2, BP, heart rate and rhythm monitor placed and checked for adequate function Safety Precautions: Patient was assessed for positional comfort and pressure points before starting the procedure. Time-out: I initiated and conducted the "Time-out" before starting the procedure, as per protocol. The patient was asked to participate by confirming the accuracy of the "Time Out" information. Verification of the correct person, site, and procedure were performed and confirmed by me, the nursing staff, and the patient. "Time-out" conducted as per Joint Commission's Universal Protocol (UP.01.01.01). Time: 0921  Description of Procedure:          Target Area: Caudal Epidural Canal. Approach: Midline approach. Area Prepped: Entire Posterior Sacrococcygeal Region DuraPrep (Iodine Povacrylex [0.7% available iodine] and Isopropyl Alcohol, 74% w/w) Safety Precautions: Aspiration looking for blood return was conducted prior to all injections. At no point did we inject any substances, as a needle was being advanced. No attempts were made at seeking any paresthesias. Safe injection practices and needle disposal techniques used. Medications properly checked for expiration dates. SDV (single dose vial) medications used. Description of the Procedure: Protocol guidelines were followed. The patient was placed in position over the fluoroscopy table. The target area was identified and the area prepped in the usual manner. Skin & deeper tissues infiltrated with local anesthetic. Appropriate amount of time allowed to pass for local anesthetics to take effect. The procedure needles were then advanced to the target area. Proper needle placement secured. Negative aspiration confirmed. Solution injected in intermittent fashion, asking for systemic symptoms every 0.5cc of injectate. The needles were then removed and the area cleansed, making sure to leave some of the prepping solution back to take advantage of its long term  bactericidal properties. Vitals:   11/14/20 0839 11/14/20 0918 11/14/20 0927  BP: (!) 151/86 (!) 179/112 (!) 192/97  Pulse: 71 71 72  Resp: 16 19 19   Temp: (!) 97.2 F (36.2 C)    TempSrc: Temporal    SpO2: 100% 100% 100%  Weight: 138 lb (62.6 kg)    Height: 5\' 2"  (1.575 m)      Start Time: 0921 hrs. End Time: 0925 hrs. Materials:  Needle(s) Type: Epidural needle Gauge: 22G Length: 3.5-in Medication(s): Please see orders for medications and dosing details. 6 cc solution made of 3 cc of preservative-free saline, 2 cc of 0.2% ropivacaine, 1 cc of Decadron 10 mg/cc.  Imaging Guidance (Spinal):          Type of Imaging Technique: Fluoroscopy Guidance (Spinal) Indication(s): Assistance in needle guidance and placement for procedures requiring needle placement in or near specific anatomical locations not easily accessible without such assistance. Exposure Time: Please see nurses notes. Contrast: Before injecting any contrast, we confirmed that the patient did not have an allergy to iodine, shellfish, or radiological contrast. Once satisfactory needle placement was completed at the desired level, radiological contrast was injected. Contrast injected under live fluoroscopy. No contrast complications. See chart for type and volume of contrast used. Fluoroscopic Guidance: I was personally present during the use of fluoroscopy. "Tunnel Vision Technique" used to obtain the best possible view of the target area. Parallax error corrected before commencing the procedure. "Direction-depth-direction" technique used to introduce the needle under continuous pulsed fluoroscopy. Once target was reached, antero-posterior, oblique, and lateral fluoroscopic projection used confirm needle placement in all planes. Images permanently stored in EMR. Interpretation: I personally interpreted the imaging intraoperatively. Adequate needle placement confirmed in multiple planes. Appropriate spread of contrast into  desired area was observed. No evidence of afferent or efferent intravascular uptake. No intrathecal or subarachnoid spread observed. Permanent images saved into the patient's record.   Post-operative Assessment:  Post-procedure Vital Signs:  Pulse/HCG Rate: 72  Temp: (!) 97.2 F (36.2 C) Resp: 19 BP: (!) 192/97 SpO2: 100 %  EBL: None  Complications: No immediate post-treatment complications observed by team, or reported by patient.  Note: The patient tolerated the entire procedure well. A repeat set of vitals were taken after the procedure and the patient was kept under observation following institutional policy, for this type of procedure. Post-procedural neurological assessment was performed, showing return to baseline, prior to discharge. The patient was provided with post-procedure discharge instructions, including a section on how to identify potential problems. Should any problems arise concerning this procedure, the patient was given instructions to immediately contact , at any time, without hesitation. In any case, we plan to contact the patient by telephone for a follow-up status report regarding this interventional procedure.  Comments:  No additional relevant information.  Plan of Care  Orders:  Orders Placed This Encounter  Procedures  . DG PAIN CLINIC C-ARM 1-60 MIN NO REPORT    Intraoperative interpretation by procedural physician at [facility].    Standing Status:   Standing    Number of Occurrences:   1    Order Specific Question:   Reason for exam:    Answer:   Assistance in needle guidance and placement for procedures requiring needle placement in or near specific anatomical locations not easily accessible without such assistance.   Chronic Opioid Analgesic:  Hydrocodone 5 mg twice daily as needed, quantity 60/month; MME equals 10    Medications ordered for procedure: Meds ordered this encounter  Medications  . iohexol (OMNIPAQUE) 180 MG/ML  injection 10 mL    Must be Myelogram-compatible. If not available, you may substitute with a water-soluble, non-ionic, hypoallergenic, myelogram-compatible radiological contrast medium.  [facility] lidocaine (XYLOCAINE) 2 % (with pres) injection 400 mg  . ropivacaine (  PF) 2 mg/mL (0.2%) (NAROPIN) injection 2 mL  . sodium chloride flush (NS) 0.9 % injection 2 mL  . dexamethasone (DECADRON) injection 10 mg  . HYDROcodone-acetaminophen (NORCO/VICODIN) 5-325 MG tablet    Sig: Take 1 tablet by mouth 2 (two) times daily as needed for severe pain. Must last 30 days.    Dispense:  60 tablet    Refill:  0    Chronic Pain. (STOP Act - Not applicable). Fill one day early if closed on scheduled refill date.  . cyclobenzaprine (FLEXERIL) 10 MG tablet    Sig: Take 1 tablet (10 mg total) by mouth 3 (three) times daily as needed for muscle spasms.    Dispense:  90 tablet    Refill:  0    Do not place this medication, or any other prescription from our practice, on "Automatic Refill". Patient may have prescription filled one day early if pharmacy is closed on scheduled refill date.   Medications administered: We administered iohexol, lidocaine, ropivacaine (PF) 2 mg/mL (0.2%), sodium chloride flush, and dexamethasone.  See the medical record for exact dosing, route, and time of administration.  Follow-up plan:   No follow-ups on file.    Recent Visits Date Type Provider Dept  10/18/20 Telemedicine Edward Jolly, MD [facility]  10/09/20 Office Visit Edward Jolly, MD [facility]  Showing recent visits within past 90 days and meeting all other requirements Today's Visits Date Type Provider Dept  11/14/20 Procedure visit Edward Jolly, MD [facility]  Showing today's visits and meeting all other requirements Future Appointments Date Type Provider Dept  11/27/20 Appointment Edward Jolly, MD [facility]  Showing future appointments within next 90 days and meeting all  other requirements  Disposition: Discharge home  Discharge (Date  Time): 11/14/2020;   hrs.   Primary Care Physician: Enid Baas, MD Location: [facility] Note by: Edward Jolly, MD Date: 11/14/2020; Time: 9:38 AM  Disclaimer:  Medicine is not an exact science. The only guarantee in medicine is that nothing is guaranteed. It is important to note that the decision to proceed with this intervention was based on the information collected from the patient. The Data and conclusions were drawn from the patient's questionnaire, the interview, and the physical examination. Because the information was provided in large part by the patient, it cannot be guaranteed that it has not been purposely or unconsciously manipulated. Every effort has been made to obtain as much relevant data as possible for this evaluation. It is important to note that the conclusions that lead to this procedure are derived in large part from the available data. Always take into account that the treatment will also be dependent on availability of resources and existing treatment guidelines, considered by other Pain Management Practitioners as being common knowledge and practice, at the time of the intervention. For Medico-Legal purposes, it is also important to point out that variation in procedural techniques and pharmacological choices are the acceptable norm. The indications, contraindications, technique, and results of the above procedure should only be interpreted and judged by a Board-Certified Interventional Pain Specialist with extensive familiarity and expertise in the same exact procedure and technique.

## 2020-11-14 NOTE — Progress Notes (Signed)
Safety precautions to be maintained throughout the outpatient stay will include: orient to surroundings, keep bed in low position, maintain call bell within reach at all times, provide assistance with transfer out of bed and ambulation.  

## 2020-11-15 ENCOUNTER — Telehealth: Payer: Self-pay | Admitting: *Deleted

## 2020-11-15 NOTE — Telephone Encounter (Signed)
LVM for post-procedure check.

## 2020-11-27 ENCOUNTER — Encounter: Payer: 59 | Admitting: Student in an Organized Health Care Education/Training Program

## 2020-12-04 ENCOUNTER — Other Ambulatory Visit: Payer: Self-pay

## 2020-12-04 ENCOUNTER — Ambulatory Visit
Payer: 59 | Attending: Student in an Organized Health Care Education/Training Program | Admitting: Student in an Organized Health Care Education/Training Program

## 2020-12-04 ENCOUNTER — Encounter: Payer: Self-pay | Admitting: Student in an Organized Health Care Education/Training Program

## 2020-12-04 VITALS — BP 142/96 | HR 72 | Temp 97.4°F | Resp 14 | Ht 62.0 in | Wt 145.0 lb

## 2020-12-04 DIAGNOSIS — G894 Chronic pain syndrome: Secondary | ICD-10-CM | POA: Diagnosis present

## 2020-12-04 DIAGNOSIS — M48062 Spinal stenosis, lumbar region with neurogenic claudication: Secondary | ICD-10-CM | POA: Insufficient documentation

## 2020-12-04 DIAGNOSIS — G8929 Other chronic pain: Secondary | ICD-10-CM | POA: Diagnosis not present

## 2020-12-04 DIAGNOSIS — M5416 Radiculopathy, lumbar region: Secondary | ICD-10-CM | POA: Insufficient documentation

## 2020-12-04 MED ORDER — HYDROCODONE-ACETAMINOPHEN 5-325 MG PO TABS: 1.0000 | ORAL_TABLET | Freq: Two times a day (BID) | ORAL | 0 refills | Status: DC | PRN

## 2020-12-04 MED ORDER — PREGABALIN 50 MG PO CAPS: 50.0000 mg | ORAL_CAPSULE | Freq: Three times a day (TID) | ORAL | 2 refills | Status: DC

## 2020-12-04 MED ORDER — HYDROCODONE-ACETAMINOPHEN 5-325 MG PO TABS: 1.0000 | ORAL_TABLET | Freq: Two times a day (BID) | ORAL | 0 refills | Status: AC | PRN

## 2020-12-04 NOTE — Progress Notes (Signed)
Nursing Pain Medication Assessment:  Safety precautions to be maintained throughout the outpatient stay will include: orient to surroundings, keep bed in low position, maintain call bell within reach at all times, provide assistance with transfer out of bed and ambulation.  Medication Inspection Compliance: Ms. Gibbon did not comply with our request to bring her pills to be counted. She was reminded that bringing the medication bottles, even when empty, is a requirement.  Medication: None brought in. Pill/Patch Count: None available to be counted. Bottle Appearance: No container available. Did not bring bottle(s) to appointment. Filled Date: N/A Last Medication intake:  Today 

## 2020-12-04 NOTE — Progress Notes (Signed)
PROVIDER NOTE: Information contained herein reflects review and annotations entered in association with encounter. Interpretation of such information and data should be left to medically-trained personnel. Information provided to patient can be located elsewhere in the medical record under "Patient Instructions". Document created using STT-dictation technology, any transcriptional errors that may result from process are unintentional.    Patient: Kari Hughes  Service Category: E/M  Provider: Gillis Santa, MD  DOB: [birth year]  DOS: 12/04/2020  Specialty: Interventional Pain Management  MRN: [id number]  Setting: Ambulatory outpatient  PCP: Gladstone Lighter, MD  Type: Established Patient    Referring Provider: Gladstone Lighter, MD  Location: Office  Delivery: Face-to-face     HPI  Kari Hughes, a 70 y.o. 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 [name] primary complain today is Back Pain (lower) Last encounter: My last encounter with her was on 11/14/2020. Pertinent problems: Kari Hughes has Fibromyalgia; Chronic pain syndrome; Bipolar disorder ([place]); Nerve pain; Lumbar degenerative disc disease; Lumbar facet arthropathy; and Spinal stenosis, lumbar region, with neurogenic claudication on their pertinent problem list. Pain Assessment: Severity of Chronic pain is reported as a 10-Worst pain ever/10. Location: Back Lower/right leg to the foot. Onset: More than a month ago. Quality: Sharp,Aching. Timing: Intermittent. Modifying factor(s): medications. Vitals:  height is '5\' 2"'  (1.575 m) and weight is 145 lb (65.8 kg). Her temporal temperature is 97.4 F (36.3 C) (abnormal). Her blood pressure is 142/96 (abnormal) and her pulse is 72. Her respiration is 14 and oxygen saturation is 96%.   Reason for encounter: both, medication management and post-procedure assessment.    Temporary benefit with caudal ESI, noticed less  radiating groin pain.  Recommend repeating in 4 weeks. Having increased right hip pain with weightbearing.  Has seen rheumatology in the past for this. Refill of hydrocodone as below.  No change in dose.  Continue Lyrica 50 mg 3 times a day, refill provided below.  Post-Procedure Evaluation  Procedure (11/14/2020): Caudal ESI  Sedation: Please see nurses note.  Effectiveness during initial hour after procedure(Ultra-Short Term Relief): 50%  Local anesthetic used: Long-acting (4-6 hours) Effectiveness: Defined as any analgesic benefit obtained secondary to the administration of local anesthetics. This carries significant diagnostic value as to the etiological location, or anatomical origin, of the pain. Duration of benefit is expected to coincide with the duration of the local anesthetic used.  Effectiveness during initial 4-6 hours after procedure(Short-Term Relief): 50%  Long-term benefit: Defined as any relief past the pharmacologic duration of the local anesthetics.  Effectiveness past the initial 6 hours after procedure(Long-Term Relief):50%  Current benefits: Defined as benefit that persist at this time.   Analgesia:  Back to baseline  Pharmacotherapy Assessment   Analgesic: Hydrocodone 5 mg twice daily as needed, quantity 60/month; MME equals 10    Monitoring: [place] PMP: PDMP reviewed during this encounter.       Pharmacotherapy: No side-effects or adverse reactions reported. Compliance: No problems identified. Effectiveness: Clinically acceptable.  Landis Martins, RN  12/04/2020 11:09 AM  Sign when Signing Visit Nursing Pain Medication Assessment:  Safety precautions to be maintained throughout the outpatient stay will include: orient to surroundings, keep bed in low position, maintain call bell within reach at all times, provide assistance with transfer out of bed and ambulation.  Medication Inspection Compliance: Kari Hughes did not comply with our request to bring her pills to be  counted. She was reminded that bringing the medication  bottles, even when empty, is a requirement.  Medication: None brought in. Pill/Patch Count: None available to be counted. Bottle Appearance: No container available. Did not bring bottle(s) to appointment. Filled Date: N/A Last Medication intake:  Today    UDS:  Summary  Date Value Ref Range Status  10/09/2020 Note  Final    Comment:    ==================================================================== Compliance Drug Analysis, Ur ==================================================================== Test                             Result       Flag       Units  Drug Present and Declared for Prescription Verification   Tramadol                       >3425        EXPECTED   ng/mg creat   O-Desmethyltramadol            2040         EXPECTED   ng/mg creat   N-Desmethyltramadol            1410         EXPECTED   ng/mg creat    Source of tramadol is a prescription medication. O-desmethyltramadol    and N-desmethyltramadol are expected metabolites of tramadol.    Gabapentin                     PRESENT      EXPECTED   Methocarbamol                  PRESENT      EXPECTED   Guaifenesin                    PRESENT      EXPECTED    Guaifenesin may be administered as an over-the-counter or    prescription drug; it may also be present as a breakdown product of    methocarbamol.    Diphenhydramine                PRESENT      EXPECTED  Drug Present not Declared for Prescription Verification   Acetaminophen                  PRESENT      UNEXPECTED  Drug Absent but Declared for Prescription Verification   Duloxetine                     Not Detected UNEXPECTED ==================================================================== Test                      Result    Flag   Units      Ref Range   Creatinine              146              mg/dL      >=20 ==================================================================== Declared  Medications:  The flagging and interpretation on this report are based on the  following declared medications.  Unexpected results may arise from  inaccuracies in the declared medications.   **Note: The testing scope of this panel includes these medications:   Diphenhydramine (Benadryl)  Duloxetine (Cymbalta)  Gabapentin (Neurontin)  Methocarbamol (Robaxin)  Tramadol (Ultram)   **Note: The testing scope of this panel does not include the  following reported medications:  Amlodipine (Norvasc)  Ezetimibe (Zetia)  Loperamide (Imodium)  Ticagrelor (Brilinta) ==================================================================== For clinical consultation, please call [phone]. ====================================================================      ROS  Constitutional: Denies any fever or chills Gastrointestinal: No reported hemesis, hematochezia, vomiting, or acute GI distress Musculoskeletal: Low back, bilateral hip, bilateral leg pain Neurological: No reported episodes of acute onset apraxia, aphasia, dysarthria, agnosia, amnesia, paralysis, loss of coordination, or loss of consciousness  Medication Review  HYDROcodone-acetaminophen, Melatonin, acetaminophen, amLODipine, cyclobenzaprine, diclofenac Sodium, ezetimibe, hydrOXYzine, loratadine, meloxicam, methocarbamol, pregabalin, sertraline, ticagrelor, and traZODone  History Review  Allergy: Ms. Hodgkins has No Known Allergies. Drug: Ms. Stivers  reports previous drug use. Alcohol:  reports previous alcohol use. Tobacco:  reports that she has been smoking. She has never used smokeless tobacco. Social: Ms. Mysliwiec  reports that she has been smoking. She has never used smokeless tobacco. She reports previous alcohol use. She reports previous drug use. Medical:  has a past medical history of Hypertension. Surgical: Ms. Truss  has a past surgical history that includes pacemaker placement (2019) and Joint replacement (Left,  2013). Family: family history is not on file.  Laboratory Chemistry Profile   Renal Lab Results  Component Value Date   BUN 23 07/01/2020   CREATININE 0.95 07/01/2020   BCR 21 05/08/2020   GFRAA 73 05/08/2020   GFRNONAA >60 07/01/2020     Hepatic Lab Results  Component Value Date   AST 23 07/01/2020   ALT 20 07/01/2020   ALBUMIN 3.8 07/01/2020   ALKPHOS 56 07/01/2020     Electrolytes Lab Results  Component Value Date   NA 142 07/01/2020   K 3.7 07/01/2020   CL 109 07/01/2020   CALCIUM 9.2 07/01/2020     Bone No results found for: VD25OH, VD125OH2TOT, QT6226JF3, LK5625WL8, 25OHVITD1, 25OHVITD2, 25OHVITD3, TESTOFREE, TESTOSTERONE   Inflammation (CRP: Acute Phase) (ESR: Chronic Phase) No results found for: CRP, ESRSEDRATE, LATICACIDVEN     Note: Above Lab results reviewed.   Physical Exam  General appearance: Well nourished, well developed, and well hydrated. In no apparent acute distress Mental status: Alert, oriented x 3 (person, place, & time)       Respiratory: No evidence of acute respiratory distress Eyes: PERLA Vitals: BP (!) 142/96   Pulse 72   Temp (!) 97.4 F (36.3 C) (Temporal)   Resp 14   Ht '5\' 2"'  (1.575 m)   Wt 145 lb (65.8 kg)   SpO2 96%   BMI 26.52 kg/m  BMI: Estimated body mass index is 26.52 kg/m as calculated from the following:   Height as of this encounter: '5\' 2"'  (1.575 m).   Weight as of this encounter: 145 lb (65.8 kg). Ideal: Ideal body weight: 50.1 kg (110 lb 7.2 oz) Adjusted ideal body weight: 56.4 kg (124 lb 4.3 oz)    Provocative Test(s):  Phalen's test: deferred Tinel's test: deferred Apley's scratch test (touch opposite shoulder):  Action 1 (Across chest): Decreased ROM Action 2 (Overhead): Decreased ROM Action 3 (LB reach): Decreased ROM    Thoracic Spine Area Exam  Skin & Axial Inspection: No masses, redness, or swelling Alignment: Symmetrical Functional ROM: Unrestricted ROM Stability: No instability  detected Muscle Tone/Strength: Functionally intact. No obvious neuro-muscular anomalies detected. Sensory (Neurological): Musculoskeletal pain pattern  Lumbar Exam  Skin & Axial Inspection: No masses, redness, or swelling Alignment: Asymmetric Functional ROM: Pain restricted ROM affecting both sides Stability: No instability detected Muscle Tone/Strength: Functionally intact. No obvious neuro-muscular anomalies detected. Sensory (Neurological): Musculoskeletal pain pattern  Gait & Posture Assessment  Ambulation: Patient came in today in a wheel chair Gait: Significantly limited. Dependent on assistive device to ambulate Posture: Difficulty standing up straight, due to pain   Lower Extremity Exam    Side: Right lower extremity  Side: Left lower extremity  Stability: No instability observed          Stability: No instability observed          Skin & Extremity Inspection: Skin color, temperature, and hair growth are WNL. No peripheral edema or cyanosis. No masses, redness, swelling, asymmetry, or associated skin lesions. No contractures.  Skin & Extremity Inspection: Skin color, temperature, and hair growth are WNL. No peripheral edema or cyanosis. No masses, redness, swelling, asymmetry, or associated skin lesions. No contractures.  Functional ROM: Pain restricted ROM for hip and knee joints          Functional ROM: Pain restricted ROM for hip and knee joints          Muscle Tone/Strength: Functionally intact. No obvious neuro-muscular anomalies detected.  Muscle Tone/Strength: Functionally intact. No obvious neuro-muscular anomalies detected.  Sensory (Neurological): Musculoskeletal pain pattern        Sensory (Neurological): Musculoskeletal pain pattern        DTR: Patellar: deferred today Achilles: deferred today Plantar: deferred today  DTR: Patellar: deferred today Achilles: deferred today Plantar: deferred today  Palpation: No palpable anomalies  Palpation: No  palpable anomalies   Assessment   Status Diagnosis  Controlled Controlled Controlled 1. Spinal stenosis, lumbar region, with neurogenic claudication   2. Chronic radicular lumbar pain   3. Chronic pain syndrome        Plan of Care   Ms. Jesiah Vigna has a current medication list which includes the following long-term medication(s): amlodipine, ezetimibe, loratadine, pregabalin, sertraline, and trazodone.  Pharmacotherapy (Medications Ordered): Meds ordered this encounter  Medications  . DISCONTD: HYDROcodone-acetaminophen (NORCO/VICODIN) 5-325 MG tablet    Sig: Take 1 tablet by mouth 2 (two) times daily as needed for severe pain. Must last 30 days.    Dispense:  60 tablet    Refill:  0    Chronic Pain. (STOP Act - Not applicable). Fill one day early if closed on scheduled refill date.  Marland Kitchen HYDROcodone-acetaminophen (NORCO/VICODIN) 5-325 MG tablet    Sig: Take 1 tablet by mouth 2 (two) times daily as needed for severe pain. Must last 30 days.    Dispense:  60 tablet    Refill:  0    Chronic Pain. (STOP Act - Not applicable). Fill one day early if closed on scheduled refill date.  Marland Kitchen DISCONTD: pregabalin (LYRICA) 50 MG capsule    Sig: Take 1 capsule (50 mg total) by mouth 3 (three) times daily.    Dispense:  90 capsule    Refill:  2    Fill one day early if pharmacy is closed on scheduled refill date. May substitute for generic if available.  Marland Kitchen HYDROcodone-acetaminophen (NORCO/VICODIN) 5-325 MG tablet    Sig: Take 1 tablet by mouth 2 (two) times daily as needed for severe pain. Must last 30 days.    Dispense:  60 tablet    Refill:  0    Chronic Pain. (STOP Act - Not applicable). Fill one day early if closed on scheduled refill date.  Marland Kitchen DISCONTD: pregabalin (LYRICA) 50 MG capsule    Sig: Take 1 capsule (50 mg total) by mouth 3 (three) times daily.    Dispense:  90 capsule  Refill:  2    Fill one day early if pharmacy is closed on scheduled refill date. May substitute for  generic if available.  . pregabalin (LYRICA) 50 MG capsule    Sig: Take 1 capsule (50 mg total) by mouth 3 (three) times daily.    Dispense:  90 capsule    Refill:  2    Fill one day early if pharmacy is closed on scheduled refill date. May substitute for generic if available.   Orders:  Orders Placed This Encounter  Procedures  . Caudal Epidural Injection    Standing Status:   Future    Standing Expiration Date:   01/04/2021    Scheduling Instructions:     Laterality: Midline     Level(s): Sacrococcygeal canal (Tailbone area)     Sedation: Patient's choice     Scheduling Timeframe: As soon as pre-approved    Order Specific Question:   Where will this procedure be performed?    Answer:   [facility]   Follow-up plan:   Return in about 4 weeks (around 01/01/2021) for caudal ESI , without sedation.   Recent Visits Date Type Provider Dept  11/14/20 Procedure visit Gillis Santa, MD [facility]  10/18/20 Telemedicine Gillis Santa, MD [facility]  10/09/20 Office Visit Gillis Santa, MD [facility]  Showing recent visits within past 90 days and meeting all other requirements Today's Visits Date Type Provider Dept  12/04/20 Office Visit Gillis Santa, MD [facility]  Showing today's visits and meeting all other requirements Future Appointments Date Type Provider Dept  01/31/21 Appointment Gillis Santa, MD [facility]  Showing future appointments within next 90 days and meeting all other requirements  I discussed the assessment and treatment plan with the patient. The patient was provided an opportunity to ask questions and all were answered. The patient agreed with the plan and demonstrated an understanding of the instructions.  Patient advised to call back or seek an in-person evaluation if the symptoms or condition worsens.  Duration of encounter: 30 minutes.  Note by: Gillis Santa, MD Date: 12/04/2020; Time: 12:12  PM

## 2020-12-04 NOTE — Patient Instructions (Addendum)
Stop Brillinta for 3 days prior to the procedure per Dr Cherylann Ratel.    Preparing for your procedure (without sedation) Instructions: . Oral Intake: Do not eat or drink anything for at least 3 hours prior to your procedure. . Transportation: Unless otherwise stated by your physician, you may drive yourself after the procedure. . Blood Pressure Medicine: Take your blood pressure medicine with a sip of water the morning of the procedure. . Insulin: Take only  of your normal insulin dose. . Preventing infections: Shower with an antibacterial soap the morning of your procedure. . Build-up your immune system: Take 1000 mg of Vitamin C with every meal (3 times a day) the day prior to your procedure. . Pregnancy: If you are pregnant, call and cancel the procedure. . Sickness: If you have a cold, fever, or any active infections, call and cancel the procedure. . Arrival: You must be in the facility at least 30 minutes prior to your scheduled procedure. . Children: Do not bring any children with you. . Dress appropriately: Bring dark clothing that you would not mind if they get stained. . Valuables: Do not bring any jewelry or valuables. Procedure appointments are reserved for interventional treatments only. Marland Kitchen No Prescription Refills. . No medication changes will be discussed during procedure appointments. . No disability issues will be discussed. Marland Kitchen

## 2020-12-10 ENCOUNTER — Other Ambulatory Visit: Payer: Self-pay | Admitting: Student in an Organized Health Care Education/Training Program

## 2020-12-10 DIAGNOSIS — M797 Fibromyalgia: Secondary | ICD-10-CM

## 2020-12-10 DIAGNOSIS — M48062 Spinal stenosis, lumbar region with neurogenic claudication: Secondary | ICD-10-CM

## 2020-12-10 DIAGNOSIS — G894 Chronic pain syndrome: Secondary | ICD-10-CM

## 2020-12-27 ENCOUNTER — Encounter: Payer: Self-pay | Admitting: Psychiatry

## 2020-12-27 ENCOUNTER — Other Ambulatory Visit: Payer: Self-pay

## 2020-12-27 ENCOUNTER — Ambulatory Visit (INDEPENDENT_AMBULATORY_CARE_PROVIDER_SITE_OTHER): Payer: 59 | Admitting: Psychiatry

## 2020-12-27 DIAGNOSIS — F331 Major depressive disorder, recurrent, moderate: Secondary | ICD-10-CM | POA: Diagnosis not present

## 2020-12-27 DIAGNOSIS — F1210 Cannabis abuse, uncomplicated: Secondary | ICD-10-CM

## 2020-12-27 DIAGNOSIS — F411 Generalized anxiety disorder: Secondary | ICD-10-CM

## 2020-12-27 DIAGNOSIS — G894 Chronic pain syndrome: Secondary | ICD-10-CM | POA: Diagnosis not present

## 2020-12-27 MED ORDER — TRAZODONE HCL 100 MG PO TABS: 100.0000 mg | ORAL_TABLET | Freq: Every day | ORAL | 1 refills | Status: DC

## 2020-12-27 MED ORDER — HYDROXYZINE HCL 10 MG PO TABS: 10.0000 mg | ORAL_TABLET | Freq: Three times a day (TID) | ORAL | 1 refills | Status: DC | PRN
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
## 2020-12-31 ENCOUNTER — Other Ambulatory Visit: Payer: Self-pay

## 2020-12-31 ENCOUNTER — Encounter: Payer: Self-pay | Admitting: Student in an Organized Health Care Education/Training Program

## 2020-12-31 ENCOUNTER — Ambulatory Visit
Payer: 59 | Attending: Student in an Organized Health Care Education/Training Program | Admitting: Student in an Organized Health Care Education/Training Program

## 2020-12-31 DIAGNOSIS — G8929 Other chronic pain: Secondary | ICD-10-CM | POA: Diagnosis present

## 2020-12-31 DIAGNOSIS — G894 Chronic pain syndrome: Secondary | ICD-10-CM | POA: Insufficient documentation

## 2020-12-31 DIAGNOSIS — M48062 Spinal stenosis, lumbar region with neurogenic claudication: Secondary | ICD-10-CM

## 2020-12-31 DIAGNOSIS — M5416 Radiculopathy, lumbar region: Secondary | ICD-10-CM | POA: Diagnosis present

## 2020-12-31 MED ORDER — CYCLOBENZAPRINE HCL 10 MG PO TABS: 10.0000 mg | ORAL_TABLET | Freq: Three times a day (TID) | ORAL | 2 refills | Status: DC | PRN

## 2020-12-31 NOTE — Addendum Note (Signed)
Addended by: Edward Jolly on: 12/31/2020 09:14 AM   Modules accepted: Orders

## 2020-12-31 NOTE — Progress Notes (Signed)
Patient discontinued her Brilinta on Saturday.  She is instructed to discontinue this medication for 5 days.  We will reschedule her for Monday for caudal ESI.

## 2020-12-31 NOTE — Progress Notes (Signed)
Safety precautions to be maintained throughout the outpatient stay will include: orient to surroundings, keep bed in low position, maintain call bell within reach at all times, provide assistance with transfer out of bed and ambulation.    Patient informed to stop taking Brilinta blood thinner 5-7  days before procedure. Will schedule procedure Monday the 27th and told patient that starting Wednesday no more brilinta. Patient agrees with plan of care.   Angelia Mould, RN

## 2020-12-31 NOTE — Patient Instructions (Addendum)
Patient informed to stop taking Brilinta blood thinner 5-7  days before procedure. Will schedule procedure Monday the 27th and told patient that starting Wednesday no more brilinta. Patient agrees with plan of care.   ____________________________________________________________________________________________  Preparing for your procedure (without sedation)  Procedure appointments are limited to planned procedures: No Prescription Refills. No disability issues will be discussed. No medication changes will be discussed.  Instructions: Oral Intake: Do not eat or drink anything for at least 6 hours prior to your procedure. (Exception: Blood Pressure Medication. See below.) Transportation: Unless otherwise stated by your physician, you may drive yourself after the procedure. Blood Pressure Medicine: Do not forget to take your blood pressure medicine with a sip of water the morning of the procedure. If your Diastolic (lower reading)is above 100 mmHg, elective cases will be cancelled/rescheduled. Blood thinners: These will need to be stopped for procedures. Notify our staff if you are taking any blood thinners. Depending on which one you take, there will be specific instructions on how and when to stop it. Diabetics on insulin: Notify the staff so that you can be scheduled 1st case in the morning. If your diabetes requires high dose insulin, take only  of your normal insulin dose the morning of the procedure and notify the staff that you have done so. Preventing infections: Shower with an antibacterial soap the morning of your procedure.  Build-up your immune system: Take 1000 mg of Vitamin C with every meal (3 times a day) the day prior to your procedure. Antibiotics: Inform the staff if you have a condition or reason that requires you to take antibiotics before dental procedures. Pregnancy: If you are pregnant, call and cancel the procedure. Sickness: If you have a cold, fever, or any active  infections, call and cancel the procedure. Arrival: You must be in the facility at least 30 minutes prior to your scheduled procedure. Children: Do not bring any children with you. Dress appropriately: Bring dark clothing that you would not mind if they get stained. Valuables: Do not bring any jewelry or valuables.  Reasons to call and reschedule or cancel your procedure: (Following these recommendations will minimize the risk of a serious complication.) Surgeries: Avoid having procedures within 2 weeks of any surgery. (Avoid for 2 weeks before or after any surgery). Flu Shots: Avoid having procedures within 2 weeks of a flu shots or . (Avoid for 2 weeks before or after immunizations). Barium: Avoid having a procedure within 7-10 days after having had a radiological study involving the use of radiological contrast. (Myelograms, Barium swallow or enema study). Heart attacks: Avoid any elective procedures or surgeries for the initial 6 months after a "Myocardial Infarction" (Heart Attack). Blood thinners: It is imperative that you stop these medications before procedures. Let us know if you if you take any blood thinner.  Infection: Avoid procedures during or within two weeks of an infection (including chest colds or gastrointestinal problems). Symptoms associated with infections include: Localized redness, fever, chills, night sweats or profuse sweating, burning sensation when voiding, cough, congestion, stuffiness, runny nose, sore throat, diarrhea, nausea, vomiting, cold or Flu symptoms, recent or current infections. It is specially important if the infection is over the area that we intend to treat. Heart and lung problems: Symptoms that may suggest an active cardiopulmonary problem include: cough, chest pain, breathing difficulties or shortness of breath, dizziness, ankle swelling, uncontrolled high or unusually low blood pressure, and/or palpitations. If you are experiencing any of these symptoms,  cancel your procedure  and contact your primary care physician for an evaluation.  Remember:  Regular Business hours are:  Monday to Thursday 8:00 AM to 4:00 PM  Provider's Schedule: Delano Metz, MD:  Procedure days: Tuesday and Thursday 7:30 AM to 4:00 PM  Edward Jolly, MD:  Procedure days: Monday and Wednesday 7:30 AM to 4:00 PM ____________________________________________________________________________________________

## 2021-01-07 ENCOUNTER — Ambulatory Visit
Payer: 59 | Attending: Student in an Organized Health Care Education/Training Program | Admitting: Student in an Organized Health Care Education/Training Program

## 2021-01-08 ENCOUNTER — Ambulatory Visit: Payer: 59 | Admitting: Student in an Organized Health Care Education/Training Program

## 2021-01-10 ENCOUNTER — Ambulatory Visit (INDEPENDENT_AMBULATORY_CARE_PROVIDER_SITE_OTHER): Payer: 59

## 2021-01-10 DIAGNOSIS — I495 Sick sinus syndrome: Secondary | ICD-10-CM

## 2021-01-11 LAB — CUP PACEART REMOTE DEVICE CHECK
Date Time Interrogation Session: 20220630142908
Implantable Lead Implant Date: 20160114
Implantable Lead Implant Date: 20160114
Implantable Lead Location: 753859
Implantable Lead Location: 753860
Implantable Lead Model: 350
Implantable Lead Model: 350
Implantable Lead Serial Number: 29732119
Implantable Lead Serial Number: 29737369
Implantable Pulse Generator Implant Date: 20160114
Pulse Gen Model: 394969
Pulse Gen Serial Number: 68455363

## 2021-01-15 ENCOUNTER — Other Ambulatory Visit (HOSPITAL_COMMUNITY): Payer: Self-pay | Admitting: Neurosurgery

## 2021-01-15 ENCOUNTER — Other Ambulatory Visit: Payer: Self-pay | Admitting: Student in an Organized Health Care Education/Training Program

## 2021-01-15 DIAGNOSIS — G8929 Other chronic pain: Secondary | ICD-10-CM

## 2021-01-15 DIAGNOSIS — G894 Chronic pain syndrome: Secondary | ICD-10-CM

## 2021-01-29 NOTE — Progress Notes (Signed)
Remote pacemaker transmission.   

## 2021-01-31 ENCOUNTER — Ambulatory Visit
Payer: 59 | Attending: Student in an Organized Health Care Education/Training Program | Admitting: Student in an Organized Health Care Education/Training Program

## 2021-01-31 ENCOUNTER — Encounter: Payer: Self-pay | Admitting: Student in an Organized Health Care Education/Training Program

## 2021-01-31 ENCOUNTER — Other Ambulatory Visit: Payer: Self-pay

## 2021-01-31 ENCOUNTER — Telehealth: Payer: Self-pay | Admitting: *Deleted

## 2021-01-31 VITALS — BP 127/79 | HR 78 | Temp 97.2°F | Resp 16 | Ht 62.0 in | Wt 154.0 lb

## 2021-01-31 DIAGNOSIS — G8929 Other chronic pain: Secondary | ICD-10-CM

## 2021-01-31 DIAGNOSIS — M2550 Pain in unspecified joint: Secondary | ICD-10-CM

## 2021-01-31 DIAGNOSIS — M5416 Radiculopathy, lumbar region: Secondary | ICD-10-CM | POA: Diagnosis present

## 2021-01-31 DIAGNOSIS — M47816 Spondylosis without myelopathy or radiculopathy, lumbar region: Secondary | ICD-10-CM

## 2021-01-31 DIAGNOSIS — M48062 Spinal stenosis, lumbar region with neurogenic claudication: Secondary | ICD-10-CM | POA: Diagnosis not present

## 2021-01-31 DIAGNOSIS — G894 Chronic pain syndrome: Secondary | ICD-10-CM | POA: Insufficient documentation

## 2021-01-31 MED ORDER — PREGABALIN 50 MG PO CAPS: 50.0000 mg | ORAL_CAPSULE | Freq: Three times a day (TID) | ORAL | 2 refills | Status: DC

## 2021-01-31 MED ORDER — HYDROCODONE-ACETAMINOPHEN 5-325 MG PO TABS: 1.0000 | ORAL_TABLET | Freq: Two times a day (BID) | ORAL | 0 refills | Status: DC | PRN

## 2021-01-31 MED ORDER — METHOCARBAMOL 750 MG PO TABS: 750.0000 mg | ORAL_TABLET | Freq: Three times a day (TID) | ORAL | 0 refills | Status: DC | PRN

## 2021-01-31 NOTE — Telephone Encounter (Signed)
Spoke with [facility], they are conveying that the Lyrica Rx failed validation and it will not go through.  She can not tell my why this happened.  Verbal of the Lyrica Rx given to pharmacist.

## 2021-01-31 NOTE — Progress Notes (Signed)
PROVIDER NOTE: Information contained herein reflects review and annotations entered in association with encounter. Interpretation of such information and data should be left to medically-trained personnel. Information provided to patient can be located elsewhere in the medical record under "Patient Instructions". Document created using STT-dictation technology, any transcriptional errors that may result from process are unintentional.    Patient: Kari Hughes  Service Category: E/M  Provider: Gillis Santa, MD  DOB: [birth year]  DOS: 01/31/2021  Specialty: Interventional Pain Management  MRN: [id number]  Setting: Ambulatory outpatient  PCP: Gladstone Lighter, MD  Type: Established Patient    Referring Provider: Gladstone Lighter, MD  Location: Office  Delivery: Face-to-face     HPI  [name], a 70 y.o. year old female, is here today because of her Chronic radicular lumbar pain [M54.16, G89.29]. [name]'s primary complain today is Back Pain (Right side), Neck Pain (right), and Leg Pain (bilat) Last encounter: My last encounter with her was on 12/04/2020. Pertinent problems: [name] has Fibromyalgia; Chronic pain syndrome; Bipolar disorder ([place]); Nerve pain; Lumbar degenerative disc disease; Lumbar facet arthropathy; and Spinal stenosis, lumbar region, with neurogenic claudication on their pertinent problem list. Pain Assessment: Severity of Chronic pain is reported as a 10-Worst pain ever/10. Location: Back Right, Mid, Lower/into right neck, right hip and legs. Onset: More than a month ago. Quality: Aching, Burning, Sharp. Timing: Constant. Modifying factor(s): meds. Vitals:  height is '5\' 2"'  (1.575 m) and weight is 154 lb (69.9 kg). Her temporal temperature is 97.2 F (36.2 C) (abnormal). Her blood pressure is 127/79 and her pulse is 78. Her respiration is 16 and oxygen saturation is 96%.   Reason for encounter: medication management.    Patient did not stop her Brilinta and time to  have her caudal ESI done.  She did not follow-up for her rescheduled caudal procedure. She did have an evaluation with Dr. Cari Caraway with neurosurgery who has recommended further work-up via lumbar MRI versus myelogram however he needs additional information regarding her pacemaker. She is struggling with managing her pain on her current dose of Hydrocodone. She ran out of her current Rx approx 4 weeks ago She is requesting increase of Hydrocodone however I recommend we wait until her T + L MRI and input from [place] regarding possible surgical plan She does have memory issues and has trouble with recall Discussed increase in Robaxin PRN for muscle spasms    Pharmacotherapy Assessment   Analgesic: Hydrocodone 5 mg twice daily as needed, quantity 60/month; MME equals 10    Monitoring: [place] PMP: PDMP reviewed during this encounter.       Pharmacotherapy: No side-effects or adverse reactions reported. Compliance: No problems identified. Effectiveness: Clinically acceptable.  Rise Patience, RN  01/31/2021 11:41 AM  Sign when Signing Visit Nursing Pain Medication Assessment:  Safety precautions to be maintained throughout the outpatient stay will include: orient to surroundings, keep bed in low position, maintain call bell within reach at all times, provide assistance with transfer out of bed and ambulation.  Medication Inspection Compliance: [name] did not comply with our request to bring her pills to be counted. She was reminded that bringing the medication bottles, even when empty, is a requirement.  Medication: None brought in. Pill/Patch Count: None available to be counted. Bottle Appearance: No container available. Did not bring bottle(s) to appointment. Filled Date: N/A Last Medication intake:  Ran out of medicine more than 48 hours ago  Pt states she ran out of Hydrocodone end of June  2022.      UDS:  Summary  Date Value Ref Range Status  10/09/2020 Note  Final    Comment:     ==================================================================== Compliance Drug Analysis, Ur ==================================================================== Test                             Result       Flag       Units  Drug Present and Declared for Prescription Verification   Tramadol                       >3425        EXPECTED   ng/mg creat   O-Desmethyltramadol            2040         EXPECTED   ng/mg creat   N-Desmethyltramadol            1410         EXPECTED   ng/mg creat    Source of tramadol is a prescription medication. O-desmethyltramadol    and N-desmethyltramadol are expected metabolites of tramadol.    Gabapentin                     PRESENT      EXPECTED   Methocarbamol                  PRESENT      EXPECTED   Guaifenesin                    PRESENT      EXPECTED    Guaifenesin may be administered as an over-the-counter or    prescription drug; it may also be present as a breakdown product of    methocarbamol.    Diphenhydramine                PRESENT      EXPECTED  Drug Present not Declared for Prescription Verification   Acetaminophen                  PRESENT      UNEXPECTED  Drug Absent but Declared for Prescription Verification   Duloxetine                     Not Detected UNEXPECTED ==================================================================== Test                      Result    Flag   Units      Ref Range   Creatinine              146              mg/dL      >=20 ==================================================================== Declared Medications:  The flagging and interpretation on this report are based on the  following declared medications.  Unexpected results may arise from  inaccuracies in the declared medications.   **Note: The testing scope of this panel includes these medications:   Diphenhydramine (Benadryl)  Duloxetine (Cymbalta)  Gabapentin (Neurontin)  Methocarbamol (Robaxin)  Tramadol (Ultram)   **Note: The testing scope  of this panel does not include the  following reported medications:   Amlodipine (Norvasc)  Ezetimibe (Zetia)  Loperamide (Imodium)  Ticagrelor (Brilinta) ==================================================================== For clinical consultation, please call [phone]. ====================================================================      ROS  Constitutional: Denies any fever or chills  Gastrointestinal: No reported hemesis, hematochezia, vomiting, or acute GI distress Musculoskeletal:  Low back, bilateral hip, bilateral leg pain Neurological: No reported episodes of acute onset apraxia, aphasia, dysarthria, agnosia, amnesia, paralysis, loss of coordination, or loss of consciousness  Medication Review  HYDROcodone-acetaminophen, acetaminophen, amLODipine, cyclobenzaprine, diclofenac Sodium, ezetimibe, hydrOXYzine, loratadine, meloxicam, methocarbamol, pregabalin, sertraline, and traZODone  History Review  Allergy: [name] has No Known Allergies. Drug: [name]  reports previous drug use. Drug: Marijuana. Alcohol:  reports current alcohol use of about 1.0 standard drink of alcohol per week. Tobacco:  reports that she has been smoking cigarettes. She has never used smokeless tobacco. Social: [name]  reports that she has been smoking cigarettes. She has never used smokeless tobacco. She reports current alcohol use of about 1.0 standard drink of alcohol per week. She reports previous drug use. Drug: Marijuana. Medical:  has a past medical history of Asthma, Depression, Hypertension, Hypertension (2002), Spinal stenosis (2012), and Vertigo. Surgical: [name]  has a past surgical history that includes pacemaker placement (2019) and Joint replacement (Left, 2013). Family: family history includes Alcohol abuse in her brother and brother; Drug abuse in her brother and brother.  Laboratory Chemistry Profile   Renal Lab Results  Component Value Date   BUN 23  07/01/2020   CREATININE 0.95 07/01/2020   BCR 21 05/08/2020   GFRAA 73 05/08/2020   GFRNONAA >60 07/01/2020     Hepatic Lab Results  Component Value Date   AST 23 07/01/2020   ALT 20 07/01/2020   ALBUMIN 3.8 07/01/2020   ALKPHOS 56 07/01/2020     Electrolytes Lab Results  Component Value Date   NA 142 07/01/2020   K 3.7 07/01/2020   CL 109 07/01/2020   CALCIUM 9.2 07/01/2020     Bone No results found for: VD25OH, VD125OH2TOT, FF6384YK5, LD3570VX7, 25OHVITD1, 25OHVITD2, 25OHVITD3, TESTOFREE, TESTOSTERONE   Inflammation (CRP: Acute Phase) (ESR: Chronic Phase) No results found for: CRP, ESRSEDRATE, LATICACIDVEN     Note: Above Lab results reviewed.   Physical Exam  General appearance: Well nourished, well developed, and well hydrated. In no apparent acute distress Mental status: Alert, oriented x 3 (person, place, & time)       Respiratory: No evidence of acute respiratory distress Eyes: PERLA Vitals: BP 127/79   Pulse 78   Temp (!) 97.2 F (36.2 C) (Temporal)   Resp 16   Ht '5\' 2"'  (1.575 m)   Wt 154 lb (69.9 kg)   SpO2 96%   BMI 28.17 kg/m  BMI: Estimated body mass index is 28.17 kg/m as calculated from the following:   Height as of this encounter: '5\' 2"'  (1.575 m).   Weight as of this encounter: 154 lb (69.9 kg). Ideal: Ideal body weight: 50.1 kg (110 lb 7.2 oz) Adjusted ideal body weight: 58 kg (127 lb 13.9 oz)    Thoracic Spine Area Exam  Skin & Axial Inspection: No masses, redness, or swelling Alignment: Symmetrical Functional ROM: Unrestricted ROM Stability: No instability detected Muscle Tone/Strength: Functionally intact. No obvious neuro-muscular anomalies detected. Sensory (Neurological): Musculoskeletal pain pattern   Lumbar Exam  Skin & Axial Inspection: No masses, redness, or swelling Alignment: Asymmetric Functional ROM: Pain restricted ROM affecting both sides Stability: No instability detected Muscle Tone/Strength: Functionally intact. No  obvious neuro-muscular anomalies detected. Sensory (Neurological): Musculoskeletal pain pattern and dermatomal   Gait & Posture Assessment  Ambulation: Patient came in today in a wheel chair Gait: Significantly limited. Dependent on assistive device to ambulate  Posture: Difficulty standing up straight, due to pain    Lower Extremity Exam      Side: Right lower extremity   Side: Left lower extremity  Stability: No instability observed           Stability: No instability observed          Skin & Extremity Inspection: Skin color, temperature, and hair growth are WNL. No peripheral edema or cyanosis. No masses, redness, swelling, asymmetry, or associated skin lesions. No contractures.   Skin & Extremity Inspection: Skin color, temperature, and hair growth are WNL. No peripheral edema or cyanosis. No masses, redness, swelling, asymmetry, or associated skin lesions. No contractures.  Functional ROM: Pain restricted ROM for hip and knee joints           Functional ROM: Pain restricted ROM for hip and knee joints          Muscle Tone/Strength: Functionally intact. No obvious neuro-muscular anomalies detected.   Muscle Tone/Strength: Functionally intact. No obvious neuro-muscular anomalies detected.  Sensory (Neurological): Musculoskeletal pain pattern         Sensory (Neurological): Musculoskeletal pain pattern        DTR: Patellar: deferred today Achilles: deferred today Plantar: deferred today   DTR: Patellar: deferred today Achilles: deferred today Plantar: deferred today  Palpation: No palpable anomalies   Palpation: No palpable anomalies   Assessment   Status Diagnosis  Persistent Persistent Persistent 1. Chronic radicular lumbar pain   2. Lumbar radiculopathy   3. Lumbar facet arthropathy   4. Chronic joint pain   5. Spinal stenosis, lumbar region, with neurogenic claudication   6. Chronic pain syndrome         Plan of Care   [name] has a current medication list  which includes the following long-term medication(s): loratadine, sertraline, trazodone, amlodipine, ezetimibe, and pregabalin.  Pharmacotherapy (Medications Ordered): Meds ordered this encounter  Medications   HYDROcodone-acetaminophen (NORCO/VICODIN) 5-325 MG tablet    Sig: Take 1 tablet by mouth 2 (two) times daily as needed for severe pain. Must last 30 days.    Dispense:  60 tablet    Refill:  0    Chronic Pain. (STOP Act - Not applicable). Fill one day early if closed on scheduled refill date.   pregabalin (LYRICA) 50 MG capsule    Sig: Take 1 capsule (50 mg total) by mouth 3 (three) times daily.    Dispense:  90 capsule    Refill:  2    Fill one day early if pharmacy is closed on scheduled refill date. May substitute for generic if available.   methocarbamol (ROBAXIN) 750 MG tablet    Sig: Take 1 tablet (750 mg total) by mouth every 8 (eight) hours as needed for muscle spasms.    Dispense:  90 tablet    Refill:  0    Do not place this medication, or any other prescription from our practice, on "Automatic Refill". Patient may have prescription filled one day early if pharmacy is closed on scheduled refill date.    Will review T+L MRI with patient at follow up in 1 month  Follow-up plan:   Return in about 4 weeks (around 02/28/2021) for Medication Management, in person.   Recent Visits Date Type Provider Dept  12/31/20 Procedure visit Gillis Santa, MD [facility]  12/04/20 Office Visit Gillis Santa, MD [facility]  11/14/20 Procedure visit Gillis Santa, MD [facility]  Showing recent visits within past 90 days  and meeting all other requirements Today's Visits Date Type Provider Dept  01/31/21 Office Visit Gillis Santa, MD [facility]  Showing today's visits and meeting all other requirements Future Appointments Date Type Provider Dept  02/28/21 Appointment Gillis Santa, MD [facility]  Showing future appointments within  next 90 days and meeting all other requirements I discussed the assessment and treatment plan with the patient. The patient was provided an opportunity to ask questions and all were answered. The patient agreed with the plan and demonstrated an understanding of the instructions.  Patient advised to call back or seek an in-person evaluation if the symptoms or condition worsens.  Duration of encounter: 30 minutes.  Note by: Gillis Santa, MD Date: 01/31/2021; Time: 1:42 PM

## 2021-01-31 NOTE — Progress Notes (Signed)
Nursing Pain Medication Assessment:  Safety precautions to be maintained throughout the outpatient stay will include: orient to surroundings, keep bed in low position, maintain call bell within reach at all times, provide assistance with transfer out of bed and ambulation.  Medication Inspection Compliance: Ms. Covey did not comply with our request to bring her pills to be counted. She was reminded that bringing the medication bottles, even when empty, is a requirement.  Medication: None brought in. Pill/Patch Count: None available to be counted. Bottle Appearance: No container available. Did not bring bottle(s) to appointment. Filled Date: N/A Last Medication intake:  Ran out of medicine more than 48 hours ago  Pt states she ran out of Hydrocodone end of June 2022.

## 2021-02-01 ENCOUNTER — Ambulatory Visit (INDEPENDENT_AMBULATORY_CARE_PROVIDER_SITE_OTHER): Payer: No Typology Code available for payment source | Admitting: Licensed Clinical Social Worker

## 2021-02-01 DIAGNOSIS — Z5329 Procedure and treatment not carried out because of patient's decision for other reasons: Secondary | ICD-10-CM

## 2021-02-01 NOTE — Progress Notes (Signed)
LCSW counselor tried to connect with patient for scheduled appointment via MyChart video text request x 2 and email request; also tried to connect via phone without success. LCSW counselor left message for patient to call office number to reschedule OPT appointment.  

## 2021-02-05 ENCOUNTER — Telehealth (HOSPITAL_BASED_OUTPATIENT_CLINIC_OR_DEPARTMENT_OTHER): Payer: No Typology Code available for payment source | Admitting: Psychiatry

## 2021-02-05 ENCOUNTER — Other Ambulatory Visit: Payer: Self-pay

## 2021-02-05 DIAGNOSIS — Z5329 Procedure and treatment not carried out because of patient's decision for other reasons: Secondary | ICD-10-CM

## 2021-02-05 NOTE — Progress Notes (Signed)
No response to call or text or video invite  

## 2021-02-07 ENCOUNTER — Ambulatory Visit (INDEPENDENT_AMBULATORY_CARE_PROVIDER_SITE_OTHER): Payer: 59 | Admitting: Licensed Clinical Social Worker

## 2021-02-07 ENCOUNTER — Other Ambulatory Visit: Payer: Self-pay

## 2021-02-07 DIAGNOSIS — F411 Generalized anxiety disorder: Secondary | ICD-10-CM | POA: Diagnosis not present

## 2021-02-07 DIAGNOSIS — F331 Major depressive disorder, recurrent, moderate: Secondary | ICD-10-CM | POA: Diagnosis not present

## 2021-02-12 ENCOUNTER — Ambulatory Visit (HOSPITAL_COMMUNITY): Admission: RE | Admit: 2021-02-12 | Payer: 59 | Source: Ambulatory Visit

## 2021-02-12 ENCOUNTER — Ambulatory Visit (HOSPITAL_COMMUNITY): Payer: 59

## 2021-02-18 ENCOUNTER — Encounter: Payer: Self-pay | Admitting: Psychiatry

## 2021-02-18 ENCOUNTER — Other Ambulatory Visit: Payer: Self-pay

## 2021-02-18 ENCOUNTER — Telehealth (INDEPENDENT_AMBULATORY_CARE_PROVIDER_SITE_OTHER): Payer: 59 | Admitting: Psychiatry

## 2021-02-18 DIAGNOSIS — F411 Generalized anxiety disorder: Secondary | ICD-10-CM

## 2021-02-18 DIAGNOSIS — F331 Major depressive disorder, recurrent, moderate: Secondary | ICD-10-CM | POA: Diagnosis not present

## 2021-02-18 DIAGNOSIS — F1210 Cannabis abuse, uncomplicated: Secondary | ICD-10-CM | POA: Diagnosis not present

## 2021-02-18 MED ORDER — SERTRALINE HCL 100 MG PO TABS: 100.0000 mg | ORAL_TABLET | Freq: Every day | ORAL | 1 refills | Status: DC

## 2021-02-18 NOTE — Progress Notes (Signed)
Virtual Visit via Telephone Note  I connected with Kiira Menzie on 02/18/21 at  3:30 PM EDT by telephone and verified that I am speaking with the correct person using two identifiers.  Location Provider Location : [facility] Patient Location : Home  Participants: Patient , Provider    I discussed the limitations, risks, security and privacy concerns of performing an evaluation and management service by telephone and the availability of in person appointments. I also discussed with the patient that there may be a patient responsible charge related to this service. The patient expressed understanding and agreed to proceed.    I discussed the assessment and treatment plan with the patient. The patient was provided an opportunity to ask questions and all were answered. The patient agreed with the plan and demonstrated an understanding of the instructions.   The patient was advised to call back or seek an in-person evaluation if the symptoms worsen or if the condition fails to improve as anticipated.  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  Patient today reports she has not noticed much benefit with the sertraline yet.  She reports she continues to have psychosocial stressors of being lonely, not having enough social interaction which does have an impact on her mood.  Patient reports she has feels the same as she was a few months ago and has not noticed much benefit from her current medications.  She reports she does have nausea however reports this has been only been going on since the past couple of days.   Unlikely due to Zoloft.  Patient reports she is sleeping better on the trazodone however continues to wake up in between to urinate.  She however does have hydroxyzine available which she can take as needed and that may have helped to go back to sleep.  She will continue to work on sleep hygiene techniques.  Patient denies any suicidality, homicidality or perceptual disturbances.  Patient reports she had one therapy visit with Ms.Karissa Brone, and does have upcoming appointment scheduled.  She looks forward to that.  Patient continues to be in pain, reports she follows up with pain provider.  Patient denies any other concerns today.  Visit Diagnosis:    ICD-10-CM   1. MDD (major depressive disorder), recurrent episode, moderate (HCC)  F33.1 sertraline (ZOLOFT) 100 MG tablet    2. GAD (generalized anxiety disorder)  F41.1 sertraline (ZOLOFT) 100 MG tablet    3. Cannabis abuse, episodic use  F12.10       Past Psychiatric History: Reviewed past psychiatric history from progress note on 12/27/2020.  Past trials of medications like sertraline, other medications that she does not remember names.  Past Medical History:  Past Medical History:  Diagnosis Date   Asthma    Depression    Hypertension    Hypertension 2002   Spinal stenosis 2012   Vertigo     Past Surgical History:  Procedure Laterality Date   JOINT REPLACEMENT Left 2013   PACEMAKER PLACEMENT  2019    Family Psychiatric History: Reviewed family psychiatric history from progress note on 12/27/2020.  Family History:  Family History  Problem  Relation Age of Onset   Drug abuse Brother    Alcohol abuse Brother    Drug abuse Brother    Alcohol abuse Brother     Social History: Reviewed social history from progress note on 12/27/2020. Social History   Socioeconomic History   Marital status: Single    Spouse name: Not on file   Number of children: 3   Years of education: Not on file   Highest education level: Not  on file  Occupational History   Not on file  Tobacco Use   Smoking status: Every Day    Years: 10.00    Types: Cigarettes   Smokeless tobacco: Never   Tobacco comments:    5 cigarettes per day  Vaping Use   Vaping Use: Never used  Substance and Sexual Activity   Alcohol use: Yes    Alcohol/week: 1.0 standard drink    Types: 1 Glasses of wine per week    Comment: occasional   Drug use: Not Currently    Types: Marijuana    Comment: stopped 6 months ago   Sexual activity: Not Currently  Other Topics Concern   Not on file  Social History Narrative   Not on file   Social Determinants of Health   Financial Resource Strain: Not on file  Food Insecurity: Not on file  Transportation Needs: Not on file  Physical Activity: Not on file  Stress: Not on file  Social Connections: Not on file    Allergies: No Known Allergies  Metabolic Disorder Labs: No results found for: HGBA1C, MPG No results found for: PROLACTIN Lab Results  Component Value Date   CHOL 225 (H) 05/08/2020   TRIG 174 (H) 05/08/2020   HDL 86 05/08/2020   LDLCALC 110 (H) 05/08/2020   Lab Results  Component Value Date   TSH 2.920 05/08/2020    Therapeutic Level Labs: No results found for: LITHIUM No results found for: VALPROATE No components found for:  CBMZ  Current Medications: Current Outpatient Medications  Medication Sig Dispense Refill   acetaminophen (TYLENOL) 500 MG tablet Take 1,000 mg by mouth every 6 (six) hours as needed.     amLODipine (NORVASC) 10 MG tablet Take 1 tablet by mouth daily.     BRILINTA 60 MG TABS tablet Take 60 mg by mouth 2 (two) times daily.     diclofenac Sodium (VOLTAREN) 1 % GEL Apply 2 g topically 4 (four) times daily.     ezetimibe (ZETIA) 10 MG tablet Take 1 tablet (10 mg total) by mouth daily. 90 tablet 1   HYDROcodone-acetaminophen (NORCO/VICODIN) 5-325 MG tablet Take 1 tablet by mouth 2 (two) times daily as needed for severe pain. Must last 30 days. 60 tablet 0    hydrOXYzine (ATARAX/VISTARIL) 10 MG tablet Take 1 tablet by mouth 3 (three) times daily as needed.     methocarbamol (ROBAXIN) 750 MG tablet Take 1 tablet (750 mg total) by mouth every 8 (eight) hours as needed for muscle spasms. 90 tablet 0   sertraline (ZOLOFT) 100 MG tablet Take 1 tablet (100 mg total) by mouth daily with breakfast. 30 tablet 1   traZODone (DESYREL) 100 MG tablet Take 1 tablet (100 mg total) by mouth at bedtime. 30 tablet 1   amLODipine (NORVASC) 10 MG tablet Take 1 tablet (10 mg total) by mouth daily. 90 tablet 3   cyclobenzaprine (FLEXERIL) 10 MG tablet Take 1 tablet (10 mg total) by mouth 3 (three) times daily as needed for muscle spasms. (  Patient not taking: Reported on 02/18/2021) 90 tablet 2   gabapentin (NEURONTIN) 400 MG capsule Take 400 mg by mouth 3 (three) times daily. (Patient not taking: Reported on 02/18/2021)     loratadine (CLARITIN) 10 MG tablet Take 10 mg by mouth daily. (Patient not taking: Reported on 02/18/2021)     meloxicam (MOBIC) 15 MG tablet Take 1 tablet by mouth daily as needed. (Patient not taking: Reported on 02/18/2021)     pregabalin (LYRICA) 50 MG capsule Take 1 capsule (50 mg total) by mouth 3 (three) times daily. (Patient not taking: Reported on 02/18/2021) 90 capsule 2   No current facility-administered medications for this visit.     Musculoskeletal: Strength & Muscle Tone:  UTA Gait & Station:  UTA Patient leans: N/A  Psychiatric Specialty Exam: Review of Systems  Psychiatric/Behavioral:  Positive for dysphoric mood. The patient is nervous/anxious.   All other systems reviewed and are negative.  There were no vitals taken for this visit.There is no height or weight on file to calculate BMI.  General Appearance:  UTA  Eye Contact:   UTA  Speech:  Clear and Coherent  Volume:  Normal  Mood:  Anxious and Depressed  Affect:   UTA  Thought Process:  Goal Directed and Descriptions of Associations: Intact  Orientation:  Full (Time, Place, and  Person)  Thought Content: Logical   Suicidal Thoughts:  No  Homicidal Thoughts:  No  Memory:  Immediate;   Fair Recent;   Fair Remote;   Fair  Judgement:  Fair  Insight:  Shallow  Psychomotor Activity:   UTA  Concentration:  Concentration: Fair and Attention Span: Fair  Recall:  Good  Fund of Knowledge: Fair  Language: Fair  Akathisia:  No  Handed:  Right  AIMS (if indicated): done  Assets:  Communication Skills Desire for Improvement Housing Social Support  ADL's:  Intact  Cognition: WNL  Sleep:   Improving   Screenings: AIMS    Flowsheet Row [facility]  AIMS Total Score 0      PHQ2-9    Flowsheet Row Counselor from 02/07/2021 in [facility] [place] Office Visit from 12/27/2020 [facility] Office [facility]/04[facility]2020 [facility] Procedure visit from [facility]/14/2020 [facility]  PHQ-2 Total Score 3 6 0 4 0  PHQ-9 Total Score 10 16 -- 17 --      Flowsheet Row [facility] Visit [facility] 12/27/2020 in [facility]  C-SSRS RISK CATEGORY No Risk        Assessment and Plan: Antoine PrimasCharmane Stockdale is a 70 year old African-American female, single, on disability, lives in [place], has a history of depression, anxiety, chronic pain was evaluated by telemedicine today.  Patient continues to struggle with depressive symptoms, likely due to psychosocial stressors of social isolation, recent relocation, multiple medical problems, will benefit from the following plan.  Plan MDD-unstable Increase sertraline to 100 mg p.o. daily. Trazodone 100 mg p.o. nightly  GAD-unstable Increase sertraline as discussed. Hydroxyzine 10 mg p.o. 3 times daily as needed  Cannabis use  episodic-in early remission We will monitor closely  Patient to continue psychotherapy sessions with Ms.Karisee Brone.  Follow-up in clinic in 3 to 4 weeks in person.   I have spent at least 20 minutes non face to face with patient today .  This note was generated in part or whole with voice recognition software. Voice recognition is usually quite accurate but there are transcription errors that can and very often do occur. I apologize for any typographical errors that were not detected and corrected.       Jomarie Longs, MD 02/19/2021, 5:13 PM

## 2021-02-20 NOTE — Progress Notes (Signed)
Virtual Visit via Telephone Note  I connected with Kari Hughes on 02/20/21 at 11:00 AM EDT by telephone and verified that I am speaking with the correct person using two identifiers.  Location: Patient: home Provider: office   I discussed the limitations, risks, security and privacy concerns of performing an evaluation and management service by telephone and the availability of in person appointments. I also discussed with the patient that there may be a patient responsible charge related to this service. The patient expressed understanding and agreed to proceed.  I discussed the assessment and treatment plan with the patient. The patient was provided an opportunity to ask questions and all were answered. The patient agreed with the plan and demonstrated an understanding of the instructions.   The patient was advised to call back or seek an in-person evaluation if the symptoms worsen or if the condition fails to improve as anticipated.  I provided 35 minutes of non-face-to-face time during this encounter.   Harlon Ditty, LCSW   Comprehensive Clinical Assessment (CCA) Note  02/07/2021 Kari Hughes [id number]  Chief Complaint: No chief complaint on file.  Visit Diagnosis: MDD, GAD    CCA Screening, Triage and Referral (STR)  Patient Reported Information How did you hear about [place]? No data recorded Referral name: No data recorded Referral phone number: No data recorded  Whom do you see for routine medical problems? No data recorded Practice/Facility Name: No data recorded Practice/Facility Phone Number: No data recorded Name of Contact: No data recorded Contact Number: No data recorded Contact Fax Number: No data recorded Prescriber Name: No data recorded Prescriber Address (if known): No data recorded  What Is the Reason for Your Visit/Call Today? No data recorded How Long Has This Been Causing You Problems? No data recorded What Do You Feel Would Help You the  Most Today? No data recorded  Have You Recently Been in Any Inpatient Treatment (Hospital/Detox/Crisis Center/28-Day Program)? No data recorded Name/Location of Program/Hospital:No data recorded How Long Were You There? No data recorded When Were You Discharged? No data recorded  Have You Ever Received Services From [facility] Before? No data recorded Who Do You See at [facility]? No data recorded  Have You Recently Had Any Thoughts About Hurting Yourself? No data recorded Are You Planning to Commit Suicide/Harm Yourself At This time? No data recorded  Have you Recently Had Thoughts About Hurting Someone Karolee Ohs? No data recorded Explanation: No data recorded  Have You Used Any Alcohol or Drugs in the Past 24 Hours? No data recorded How Long Ago Did You Use Drugs or Alcohol? No data recorded What Did You Use and How Much? No data recorded  Do You Currently Have a Therapist/Psychiatrist? No data recorded Name of Therapist/Psychiatrist: No data recorded  Have You Been Recently Discharged From Any Office Practice or Programs? No data recorded Explanation of Discharge From Practice/Program: No data recorded    CCA Screening Triage Referral Assessment Type of Contact: No data recorded Is this Initial or Reassessment? No data recorded Date Telepsych consult ordered in [facility]:  07/01/20  Time Telepsych consult ordered in [facility]:  0016   Patient Reported Information Reviewed? No data recorded Patient Left Without Being Seen? No data recorded Reason for Not Completing Assessment: No data recorded  Collateral Involvement: No data recorded  Does Patient Have a Court Appointed Legal Guardian? No data recorded Name and Contact of Legal Guardian: Self  If Minor and Not Living with Parent(s), Who has Custody? n/a  Is  CPS involved or ever been involved? Never  Is APS involved or ever been involved? Never   Patient Determined To Be At Risk for Harm To Self or Others Based on Review of  Patient Reported Information or Presenting Complaint? No data recorded Method: No data recorded Availability of Means: No data recorded Intent: No data recorded Notification Required: No data recorded Additional Information for Danger to Others Potential: No data recorded Additional Comments for Danger to Others Potential: No data recorded Are There Guns or Other Weapons in Your Home? No data recorded Types of Guns/Weapons: No data recorded Are These Weapons Safely Secured?                            No data recorded Who Could Verify You Are Able To Have These Secured: No data recorded Do You Have any Outstanding Charges, Pending Court Dates, Parole/Probation? No data recorded Contacted To Inform of Risk of Harm To Self or Others: No data recorded  Location of Assessment: [facility] ED   Does Patient Present under Involuntary Commitment? No data recorded IVC Papers Initial File Date: No data recorded  [place] of Residence: No data recorded  Patient Currently Receiving the Following Services: No data recorded  Determination of Need: No data recorded  Options For Referral: No data recorded    CCA Biopsychosocial Intake/Chief Complaint:  Client presents at the referral of her primary psychiatrist. Client reports currently living with her sister after moving to [place] from [place] after previously living with her brother who gave her a move out date, and is frustrated with not being able to get out much due to lack of transportation in the area and ongoing medical problems. Client stated she feels "stuck" and has been trying to save up money to get her own place but "seems like my health is a problem." Client is working on getting new doctors in place for health and mental health, "I just don't feel good about anything and I find myself down"  Current Symptoms/Problems: No data recorded  Patient Reported Schizophrenia/Schizoaffective Diagnosis in Past: No   Strengths: No data recorded Preferences:  phone calls for therapy  Abilities: people person   Type of Services Patient Feels are Needed: did not remember referal being made for therapy. is open to speaking with someone outside of the home on a regular basis   Initial Clinical Notes/Concerns: No data recorded  Mental Health Symptoms Depression:   Change in energy/activity; Fatigue; Sleep (too much or little); Increase/decrease in appetite   Duration of Depressive symptoms:  Greater than two weeks   Mania:   None   Anxiety:    None   Psychosis:   None   Duration of Psychotic symptoms: No data recorded  Trauma:   None (denies)   Obsessions:   None   Compulsions:   None   Inattention:   None   Hyperactivity/Impulsivity:   None   Oppositional/Defiant Behaviors:   None   Emotional Irregularity:   None   Other Mood/Personality Symptoms:  No data recorded   Mental Status Exam Appearance and self-care  Stature:   -- (unable to assess; phone visit)   Weight:   -- (unable to assess, phone visit)   Clothing:   -- (unable to assess, phone visit)   Grooming:   -- (unable to assess, phone visit)   Cosmetic use:   -- (unable to assess, phone visit)   Posture/gait:   Other (Comment) (  unable to assess, phone visit)   Motor activity:   -- (unable to assess, phone visit)   Sensorium  Attention:   Normal   Concentration:   Normal   Orientation:   X5   Recall/memory:   Normal   Affect and Mood  Affect:   Congruent   Mood:   Euthymic   Relating  Eye contact:   -- (unable to assess, phone visit)   Facial expression:   -- (unable to assess, phone visit)   Attitude toward examiner:   Cooperative   Thought and Language  Speech flow:  Clear and Coherent   Thought content:   Appropriate to Mood and Circumstances   Preoccupation:   None   Hallucinations:   None   Organization:  No data recorded  [employer] of Knowledge:   Good   Intelligence:    Average   Abstraction:   Normal   Judgement:   Common-sensical   Reality Testing:   Realistic   Insight:   Fair   Decision Making:   Normal   Social Functioning  Social Maturity:   Responsible   Social Judgement:   Normal   Stress  Stressors:   Grief/losses; Housing; Illness; Financial; Transitions   Coping Ability:   Normal   Skill Deficits:   Responsibility   Supports:   Family     Religion: Religion/Spirituality Are You A Religious Person?: Yes  Leisure/Recreation: Leisure / Recreation Do You Have Hobbies?: Yes  Exercise/Diet: Exercise/Diet Do You Exercise?: Yes What Type of Exercise Do You Do?: Run/Walk Have You Gained or Lost A Significant Amount of Weight in the Past Six Months?: Yes-Gained ("southern food got me") Do You Follow a Special Diet?: No Do You Have Any Trouble Sleeping?: Yes Explanation of Sleeping Difficulties: does not find prescribed trazadone helpful   CCA Employment/Education Employment/Work Situation: Employment / Work Situation Employment Situation: Retired Passenger transport manager has Been Impacted by Current Illness: No Where was the Patient Employed at that Time?: hx working in Merchandiser, retail and as toll taker Has Patient ever Been in Equities trader?: No  Education: Education Is Patient Currently Attending School?: No Last Grade Completed: 12 Did Garment/textile technologist From [employer]?: Yes Did Theme park manager?: Yes What Type of College Degree Do you Have?: different little programs but not college Did Designer, television/film set?: No Did You Have An Individualized Education Program (IIEP): No Did You Have Any Difficulty At [employer]?: No Patient's Education Has Been Impacted by Current Illness: No   CCA Family/Childhood History Family and Relationship History: Family history Marital status: Single Are you sexually active?: No Does patient have children?: Yes How many children?: 3 How is patient's relationship with their children?: 3  kids; havent seen son since 11/20/2005 when mom past away; 2 daughters not in contact since moved to [place]  Childhood History:  Childhood History By whom was/is the patient raised?: Both parents, Mother/father and step-parent Additional childhood history information: "Hard growing up" different father from siblings "I was sensitive" clt reported she had Anxiety/crying growing up How were you disciplined when you got in trouble as a child/adolescent?: appropriate for then Does patient have siblings?: Yes Number of Siblings: 8 Description of patient's current relationship with siblings: [place] brother [place] in 10-21-2022; 2 sisters died in last year and 2010-11-21; currently living with only living sister in [place], previously living with brother in [place] Did patient suffer any verbal/emotional/physical/sexual abuse as a child?: No Did patient suffer from severe  childhood neglect?: No Has patient ever been sexually abused/assaulted/raped as an adolescent or adult?: No Was the patient ever a victim of a crime or a disaster?: No Witnessed domestic violence?: No Has patient been affected by domestic violence as an adult?: No  Child/Adolescent Assessment:     CCA Substance Use Alcohol/Drug Use: Alcohol / Drug Use Pain Medications: See EHR Prescriptions: see EHR Over the Counter: see EHR History of alcohol / drug use?: No history of alcohol / drug abuse Longest period of sobriety (when/how long): current due to pain management; social drinking, no marijuana use since starting pain management clinic [facility](THC previously helped with back pain per client report) Negative Consequences of Use: Personal relationships Withdrawal Symptoms: None Substance #1 Name of Substance 1: marijuana                       ASAM's:  Six Dimensions of Multidimensional Assessment  Dimension 1:  Acute Intoxication and/or Withdrawal Potential:      Dimension 2:  Biomedical Conditions and Complications:   Dimension 2:   Description of patient's biomedical conditions and  complications: chronic pain; now in pain management clinic  Dimension 3:  Emotional, Behavioral, or Cognitive Conditions and Complications:     Dimension 4:  Readiness to Change:     Dimension 5:  Relapse, Continued use, or Continued Problem Potential:     Dimension 6:  Recovery/Living Environment:     ASAM Severity Score:    ASAM Recommended Level of Treatment: ASAM Recommended Level of Treatment: Level I Outpatient Treatment (SUD not of clinical focus)   Substance use Disorder (SUD)    Recommendations for Services/Supports/Treatments: Recommendations for Services/Supports/Treatments Recommendations For Services/Supports/Treatments: Individual Therapy, Medication Management  DSM5 Diagnoses: Patient Active Problem List   Diagnosis Date Noted   GAD (generalized anxiety disorder) 12/27/2020   Cannabis abuse, episodic use 12/27/2020   Lumbar degenerative disc disease 10/09/2020   Lumbar facet arthropathy 10/09/2020   Spinal stenosis, lumbar region, with neurogenic claudication 10/09/2020   Bipolar disorder (HCC) 07/12/2020   Marijuana use 07/12/2020   On anticoagulant therapy 07/12/2020   Nerve pain 07/12/2020   Right otitis media 07/03/2020   Pharyngitis 07/03/2020   MDD (major depressive disorder), recurrent episode, moderate (HCC)    Fibromyalgia    Chronic pain syndrome    Acute non-recurrent sinusitis 06/01/2020   Anxiety 06/01/2020   Encounter for medication refill 06/01/2020   No-show for appointment 05/28/2020   Chronic bilateral low back pain with bilateral sciatica 05/11/2020   Chronic joint pain 05/11/2020   Hypertension 05/11/2020   Change in vision 05/11/2020   Body mass index 26.0-26.9, adult 05/11/2020   Hyperlipidemia 05/10/2020   Pacemaker 05/08/2020   Lumbar radiculopathy 03/30/2020    Patient Centered Plan: Patient is on the following Treatment Plan(s):  Client agreed to individual therapy to decrease  symptoms of depression and anxiety by half.   Referrals to Alternative Service(s): Referred to Alternative Service(s):   Place:   Date:   Time:    Referred to Alternative Service(s):   Place:   Date:   Time:    Referred to Alternative Service(s):   Place:   Date:   Time:    Referred to Alternative Service(s):   Place:   Date:   Time:     Harlon DittyKarissa A Terez Montee, LCSW

## 2021-02-21 ENCOUNTER — Other Ambulatory Visit: Payer: Self-pay

## 2021-02-21 ENCOUNTER — Ambulatory Visit (INDEPENDENT_AMBULATORY_CARE_PROVIDER_SITE_OTHER): Payer: 59 | Admitting: Licensed Clinical Social Worker

## 2021-02-21 DIAGNOSIS — F331 Major depressive disorder, recurrent, moderate: Secondary | ICD-10-CM | POA: Diagnosis not present

## 2021-02-21 NOTE — Progress Notes (Signed)
Virtual Visit via Telephone Note  I connected with [name] on 02/21/21 at 11:00 AM EDT by telephone and verified that I am speaking with the correct person using two identifiers.  Location: Patient: home Provider: office   I discussed the limitations, risks, security and privacy concerns of performing an evaluation and management service by telephone and the availability of in person appointments. I also discussed with the patient that there may be a patient responsible charge related to this service. The patient expressed understanding and agreed to proceed.  I discussed the assessment and treatment plan with the patient. The patient was provided an opportunity to ask questions and all were answered. The patient agreed with the plan and demonstrated an understanding of the instructions.   The patient was advised to call back or seek an in-person evaluation if the symptoms worsen or if the condition fails to improve as anticipated.  I provided 30 minutes of non-face-to-face time during this encounter.   Kari Ditty, LCSW    THERAPIST PROGRESS NOTE  Session Time: 11am-1130am  Participation Level: Active  Behavioral Response: NAAlertEuthymic  Type of Therapy: Individual Therapy  Treatment Goals addressed: Coping  Interventions: CBT and Supportive  Summary: Kari Hughes is a 70 y.o. female who presents with major depressive disorder, recurrent episode, moderate. Client reported marked improvement in mood due to trying to focus on positive things in her life. Client reported engaging more with physical therapy and spirituality. Client receptive to day program referrals and inquired about alternative supervised living options.  Suicidal/Homicidal: Nowithout intent/plan  Therapist Response: Clinician praised client use of gratitude skill to avoid ruminating on negative thoughts. Clinician reviewed with client the connectin of thoughts, feelings, and behaviors and that  altering one affects the others. Clinician inquired about making a referral to a day program to increased social engagement.  Plan: Return again in 2 weeks.  Diagnosis: Axis I: major depressive disorder recurrent episode moderate         Kari Ditty, LCSW 02/21/2021

## 2021-02-28 ENCOUNTER — Ambulatory Visit
Payer: 59 | Attending: Student in an Organized Health Care Education/Training Program | Admitting: Student in an Organized Health Care Education/Training Program

## 2021-02-28 ENCOUNTER — Encounter: Payer: Self-pay | Admitting: Student in an Organized Health Care Education/Training Program

## 2021-02-28 ENCOUNTER — Other Ambulatory Visit: Payer: Self-pay

## 2021-02-28 ENCOUNTER — Ambulatory Visit (INDEPENDENT_AMBULATORY_CARE_PROVIDER_SITE_OTHER): Payer: 59 | Admitting: Licensed Clinical Social Worker

## 2021-02-28 VITALS — BP 155/96 | HR 80 | Temp 97.0°F | Resp 16 | Ht 62.0 in | Wt 153.0 lb

## 2021-02-28 DIAGNOSIS — R110 Nausea: Secondary | ICD-10-CM

## 2021-02-28 DIAGNOSIS — F331 Major depressive disorder, recurrent, moderate: Secondary | ICD-10-CM | POA: Diagnosis not present

## 2021-02-28 DIAGNOSIS — G8929 Other chronic pain: Secondary | ICD-10-CM | POA: Diagnosis present

## 2021-02-28 DIAGNOSIS — M5442 Lumbago with sciatica, left side: Secondary | ICD-10-CM | POA: Diagnosis present

## 2021-02-28 DIAGNOSIS — M47816 Spondylosis without myelopathy or radiculopathy, lumbar region: Secondary | ICD-10-CM | POA: Insufficient documentation

## 2021-02-28 DIAGNOSIS — G894 Chronic pain syndrome: Secondary | ICD-10-CM | POA: Insufficient documentation

## 2021-02-28 DIAGNOSIS — M2550 Pain in unspecified joint: Secondary | ICD-10-CM | POA: Insufficient documentation

## 2021-02-28 DIAGNOSIS — M5416 Radiculopathy, lumbar region: Secondary | ICD-10-CM | POA: Diagnosis present

## 2021-02-28 DIAGNOSIS — M5441 Lumbago with sciatica, right side: Secondary | ICD-10-CM | POA: Insufficient documentation

## 2021-02-28 DIAGNOSIS — M48062 Spinal stenosis, lumbar region with neurogenic claudication: Secondary | ICD-10-CM | POA: Insufficient documentation

## 2021-02-28 MED ORDER — ONDANSETRON 8 MG PO TBDP: 8.0000 mg | ORAL_TABLET | Freq: Two times a day (BID) | ORAL | 0 refills | Status: AC

## 2021-02-28 MED ORDER — PREGABALIN 25 MG PO CAPS: 25.0000 mg | ORAL_CAPSULE | Freq: Every day | ORAL | 2 refills | Status: DC

## 2021-02-28 MED ORDER — HYDROCODONE-ACETAMINOPHEN 5-325 MG PO TABS: 1.0000 | ORAL_TABLET | Freq: Two times a day (BID) | ORAL | 0 refills | Status: AC | PRN

## 2021-02-28 MED ORDER — CYCLOBENZAPRINE HCL 10 MG PO TABS: 10.0000 mg | ORAL_TABLET | Freq: Three times a day (TID) | ORAL | 2 refills | Status: DC | PRN

## 2021-02-28 NOTE — Progress Notes (Signed)
Virtual Visit via Telephone Note  I connected with Kari Hughes on 02/28/21 at 11:00 AM EDT by telephone and verified that I am speaking with the correct person using two identifiers.  Location: Patient: home  Provider: office   I discussed the limitations, risks, security and privacy concerns of performing an evaluation and management service by telephone and the availability of in person appointments. I also discussed with the patient that there may be a patient responsible charge related to this service. The patient expressed understanding and agreed to proceed.  I discussed the assessment and treatment plan with the patient. The patient was provided an opportunity to ask questions and all were answered. The patient agreed with the plan and demonstrated an understanding of the instructions.   The patient was advised to call back or seek an in-person evaluation if the symptoms worsen or if the condition fails to improve as anticipated.  I provided 30 minutes of non-face-to-face time during this encounter.   Harlon Ditty, LCSW    THERAPIST PROGRESS NOTE  Session Time: 1110am-1145am  Participation Level: Active  Behavioral Response: NAAlertEuthymic  Type of Therapy: Individual Therapy  Treatment Goals addressed: Coping  Interventions: Motivational Interviewing, Solution Focused, and Supportive  Summary: Kari Hughes is a 70 y.o. female who presents with symptoms of major depressive disorder, mild.   Suicidal/Homicidal: Nowithout intent/plan  Therapist Response: Clinician assessed for SI/HI/psychosis and overall level of functioning. Client appeared fully oriented and reported improved depressive symptoms following increase exercise/physical therapy and focusing on positive thoughts. Clinician processed with client changes in recent stressors and identified alternative healthy thoughts to previously identified frustrations related to health and housing. Client showed  progress toward goals AEB focus on positive thoughts to address uncomfortable feelings, and use of improving physically to support mental changes. Client reported taking medication as prescribed and while some trouble with sleep is able to function and has been less irritable. Client requires continued support to decrease isolation which she previously identified as a trigger for depression.  Plan: Return again in 2-3 weeks.  Diagnosis: Axis I: major depressive disorder      Harlon Ditty, LCSW 02/28/2021

## 2021-02-28 NOTE — Progress Notes (Signed)
Nursing Pain Medication Assessment:  Safety precautions to be maintained throughout the outpatient stay will include: orient to surroundings, keep bed in low position, maintain call bell within reach at all times, provide assistance with transfer out of bed and ambulation.  Medication Inspection Compliance: Pill count conducted under aseptic conditions, in front of the patient. Neither the pills nor the bottle was removed from the patient's sight at any time. Once count was completed pills were immediately returned to the patient in their original bottle.  Medication: Hydrocodone/APAP Pill/Patch Count:  26 of 60 pills remain Pill/Patch Appearance: Markings consistent with prescribed medication Bottle Appearance: Standard pharmacy container. Clearly labeled. Filled Date: 07 / 21 / 2022 Last Medication intake:  Today

## 2021-02-28 NOTE — Progress Notes (Addendum)
PROVIDER NOTE: Information contained herein reflects review and annotations entered in association with encounter. Interpretation of such information and data should be left to medically-trained personnel. Information provided to patient can be located elsewhere in the medical record under "Patient Instructions". Document created using STT-dictation technology, any transcriptional errors that may result from process are unintentional.    Patient: Sonny Masters  Service Category: E/M  Provider: Gillis Santa, MD  DOB: 1951-06-23  DOS: 02/28/2021  Specialty: Interventional Pain Management  MRN: [id number]  Setting: Ambulatory outpatient  PCP: Gladstone Lighter, MD  Type: Established Patient    Referring Provider: Gladstone Lighter, MD  Location: Office  Delivery: Face-to-face     HPI  Ms. Aalyiah Camberos, a 70 y.o. year old female, is here today because of her Chronic radicular lumbar pain [M54.16, G89.29]. Ms. Mennen's primary complain today is Back Pain (right), Neck Pain, Shoulder Pain, and Arm Pain Last encounter: My last encounter with her was on 01/31/2021. Pertinent problems: Ms. Heinle has Fibromyalgia; Chronic pain syndrome; Bipolar disorder ([place]); Nerve pain; Lumbar degenerative disc disease; Lumbar facet arthropathy; and Spinal stenosis, lumbar region, with neurogenic claudication on their pertinent problem list. Pain Assessment: Severity of Chronic pain is reported as a 5 /10. Location: Back Lower/radiates into right leg. Onset: More than a month ago. Quality: Throbbing. Timing: Constant. Modifying factor(s): meds. Vitals:  height is _0  (1.575 m) and weight is 153 lb (69.4 kg). Her temperature is 97 F (36.1 C) (abnormal). Her blood pressure is 155/96 (abnormal) and her pulse is 80. Her respiration is 16 and oxygen saturation is 100%.   Reason for encounter: medication management.    No significant changes in [name]'s medical history since her last clinic visit.  She is awaiting her  MRI which is supposed to happen later this month. She has been working with physical therapy.  She has also been more functional.  She states that she has been walking outside.  She is in a better mood.  She continues to have generalized anxiety and has stress when she wakes up in the morning but is try to cope with that better. She was on Lyrica 50 mg nightly.  She is concerned about weight gain.  I appease her concerns that higher doses can result in lower extremity edema and that this medication to be helpful for her fibromyalgia as well as her chronic pain syndrome.  We discussed 25 to 50 mg nightly. She also continues Flexeril as needed for muscle spasms.  We will refill as below. We discussed caudal epidural steroid injection after she has had her lumbar MRI.  She is instructed to stop Brilinta 3 days prior to her caudal ESI.  Pharmacotherapy Assessment  Analgesic: Hydrocodone 5 mg twice daily as needed, quantity 60/month; MME equals 10    Monitoring: [place] PMP: PDMP reviewed during this encounter.       Pharmacotherapy: No side-effects or adverse reactions reported. Compliance: No problems identified. Effectiveness: Clinically acceptable.  UDS:  Summary  Date Value Ref Range Status  10/09/2020 Note  Final    Comment:    ==================================================================== Compliance Drug Analysis, Ur ==================================================================== Test                             Result       Flag       Units  Drug Present and Declared for Prescription Verification   Tramadol                       >  3425        EXPECTED   ng/mg creat   O-Desmethyltramadol            2040         EXPECTED   ng/mg creat   N-Desmethyltramadol            1410         EXPECTED   ng/mg creat    Source of tramadol is a prescription medication. O-desmethyltramadol    and N-desmethyltramadol are expected metabolites of tramadol.    Gabapentin                     PRESENT       EXPECTED   Methocarbamol                  PRESENT      EXPECTED   Guaifenesin                    PRESENT      EXPECTED    Guaifenesin may be administered as an over-the-counter or    prescription drug; it may also be present as a breakdown product of    methocarbamol.    Diphenhydramine                PRESENT      EXPECTED  Drug Present not Declared for Prescription Verification   Acetaminophen                  PRESENT      UNEXPECTED  Drug Absent but Declared for Prescription Verification   Duloxetine                     Not Detected UNEXPECTED ==================================================================== Test                      Result    Flag   Units      Ref Range   Creatinine              146              mg/dL      >=20 ==================================================================== Declared Medications:  The flagging and interpretation on this report are based on the  following declared medications.  Unexpected results may arise from  inaccuracies in the declared medications.   **Note: The testing scope of this panel includes these medications:   Diphenhydramine (Benadryl)  Duloxetine (Cymbalta)  Gabapentin (Neurontin)  Methocarbamol (Robaxin)  Tramadol (Ultram)   **Note: The testing scope of this panel does not include the  following reported medications:   Amlodipine (Norvasc)  Ezetimibe (Zetia)  Loperamide (Imodium)  Ticagrelor (Brilinta) ==================================================================== For clinical consultation, please call [phone]. ====================================================================       ROS  Constitutional: Denies any fever or chills Gastrointestinal: No reported hemesis, hematochezia, vomiting, or acute GI distress Musculoskeletal:  Low back, bilateral hip, bilateral leg pain Neurological: No reported episodes of acute onset apraxia, aphasia, dysarthria, agnosia, amnesia, paralysis, loss of  coordination, or loss of consciousness  Medication Review  HYDROcodone-acetaminophen, acetaminophen, amLODipine, cyclobenzaprine, ezetimibe, hydrOXYzine, meloxicam, ondansetron, pregabalin, sertraline, ticagrelor, and traZODone  History Review  Allergy: Ms. Kronberg has No Known Allergies. Drug: Ms. Ullmer  reports that she does not currently use drugs after having used the following drugs: Marijuana. Alcohol:  reports current alcohol use of about 1.0 standard drink per week. Tobacco:  reports that she has been smoking cigarettes. She  has never used smokeless tobacco. Social: Ms. Arras  reports that she has been smoking cigarettes. She has never used smokeless tobacco. She reports current alcohol use of about 1.0 standard drink per week. She reports that she does not currently use drugs after having used the following drugs: Marijuana. Medical:  has a past medical history of Asthma, Depression, Hypertension, Hypertension (2002), Spinal stenosis (2012), and Vertigo. Surgical: Ms. Payes  has a past surgical history that includes pacemaker placement (2019) and Joint replacement (Left, 2013). Family: family history includes Alcohol abuse in her brother and brother; Drug abuse in her brother and brother.  Laboratory Chemistry Profile   Renal Lab Results  Component Value Date   BUN 23 07/01/2020   CREATININE 0.95 07/01/2020   BCR 21 05/08/2020   GFRAA 73 05/08/2020   GFRNONAA >60 07/01/2020     Hepatic Lab Results  Component Value Date   AST 23 07/01/2020   ALT 20 07/01/2020   ALBUMIN 3.8 07/01/2020   ALKPHOS 56 07/01/2020     Electrolytes Lab Results  Component Value Date   NA 142 07/01/2020   K 3.7 07/01/2020   CL 109 07/01/2020   CALCIUM 9.2 07/01/2020     Bone No results found for: VD25OH, VD125OH2TOT, YW7371GG2, IR4854OE7, 25OHVITD1, 25OHVITD2, 25OHVITD3, TESTOFREE, TESTOSTERONE   Inflammation (CRP: Acute Phase) (ESR: Chronic Phase) No results found for: CRP, ESRSEDRATE,  LATICACIDVEN     Note: Above Lab results reviewed.   Physical Exam  General appearance: Well nourished, well developed, and well hydrated. In no apparent acute distress Mental status: Alert, oriented x 3 (person, place, & time)       Respiratory: No evidence of acute respiratory distress Eyes: PERLA Vitals: BP (!) 155/96 (BP Location: Left Arm, Patient Position: Sitting, Cuff Size: Normal)   Pulse 80   Temp (!) 97 F (36.1 C)   Resp 16   Ht _0  (1.575 m)   Wt 153 lb (69.4 kg)   SpO2 100%   BMI 27.98 kg/m  BMI: Estimated body mass index is 27.98 kg/m as calculated from the following:   Height as of this encounter: _1  (1.575 m).   Weight as of this encounter: 153 lb (69.4 kg). Ideal: Ideal body weight: 50.1 kg (110 lb 7.2 oz) Adjusted ideal body weight: 57.8 kg (127 lb 7.5 oz)    Thoracic Spine Area Exam  Skin & Axial Inspection: No masses, redness, or swelling Alignment: Symmetrical Functional ROM: Unrestricted ROM Stability: No instability detected Muscle Tone/Strength: Functionally intact. No obvious neuro-muscular anomalies detected. Sensory (Neurological): Musculoskeletal pain pattern   Lumbar Exam  Skin & Axial Inspection: No masses, redness, or swelling Alignment: Asymmetric Functional ROM: Pain restricted ROM affecting both sides Stability: No instability detected Muscle Tone/Strength: Functionally intact. No obvious neuro-muscular anomalies detected. Sensory (Neurological): Musculoskeletal pain pattern and dermatomal   Gait & Posture Assessment  Ambulation: Patient came in today in a wheel chair Gait: Significantly limited. Dependent on assistive device to ambulate Posture: Difficulty standing up straight, due to pain    Lower Extremity Exam      Side: Right lower extremity   Side: Left lower extremity  Stability: No instability observed           Stability: No instability observed          Skin & Extremity Inspection: Skin color, temperature, and hair  growth are WNL. No peripheral edema or cyanosis. No masses, redness, swelling, asymmetry, or associated skin lesions. No contractures.  Skin & Extremity Inspection: Skin color, temperature, and hair growth are WNL. No peripheral edema or cyanosis. No masses, redness, swelling, asymmetry, or associated skin lesions. No contractures.  Functional ROM: Pain restricted ROM for hip and knee joints           Functional ROM: Pain restricted ROM for hip and knee joints          Muscle Tone/Strength: Functionally intact. No obvious neuro-muscular anomalies detected.   Muscle Tone/Strength: Functionally intact. No obvious neuro-muscular anomalies detected.  Sensory (Neurological): Musculoskeletal pain pattern         Sensory (Neurological): Musculoskeletal pain pattern        DTR: Patellar: deferred today Achilles: deferred today Plantar: deferred today   DTR: Patellar: deferred today Achilles: deferred today Plantar: deferred today  Palpation: No palpable anomalies   Palpation: No palpable anomalies   Assessment   Status Diagnosis  Persistent Persistent Persistent 1. Chronic radicular lumbar pain   2. Lumbar radiculopathy   3. Lumbar facet arthropathy   4. Chronic bilateral low back pain with bilateral sciatica   5. Spinal stenosis, lumbar region, with neurogenic claudication   6. Chronic joint pain   7. Nausea without vomiting   8. Chronic pain syndrome          Plan of Care   Ms. Teal Grave has a current medication list which includes the following long-term medication(s): amlodipine, ezetimibe, pregabalin, sertraline, and trazodone.  Pharmacotherapy (Medications Ordered): Meds ordered this encounter  Medications   HYDROcodone-acetaminophen (NORCO/VICODIN) 5-325 MG tablet    Sig: Take 1 tablet by mouth 2 (two) times daily as needed for severe pain. Must last 30 days.    Dispense:  60 tablet    Refill:  0    Chronic Pain. (STOP Act - Not applicable). Fill one day early if  closed on scheduled refill date.   HYDROcodone-acetaminophen (NORCO/VICODIN) 5-325 MG tablet    Sig: Take 1 tablet by mouth 2 (two) times daily as needed for severe pain. Must last 30 days.    Dispense:  60 tablet    Refill:  0    Chronic Pain. (STOP Act - Not applicable). Fill one day early if closed on scheduled refill date.   HYDROcodone-acetaminophen (NORCO/VICODIN) 5-325 MG tablet    Sig: Take 1 tablet by mouth 2 (two) times daily as needed for severe pain. Must last 30 days.    Dispense:  60 tablet    Refill:  0    Chronic Pain. (STOP Act - Not applicable). Fill one day early if closed on scheduled refill date.   pregabalin (LYRICA) 25 MG capsule    Sig: Take 1-2 capsules (25-50 mg total) by mouth at bedtime.    Dispense:  90 capsule    Refill:  2   ondansetron (ZOFRAN-ODT) 8 MG disintegrating tablet    Sig: Take 1 tablet (8 mg total) by mouth 2 (two) times daily for 15 days.    Dispense:  60 tablet    Refill:  0    Do not add to the electronic "Automatic Refill" notification system. Patient may have prescription filled one day early if pharmacy is closed on scheduled refill date.   cyclobenzaprine (FLEXERIL) 10 MG tablet    Sig: Take 1 tablet (10 mg total) by mouth 3 (three) times daily as needed for muscle spasms.    Dispense:  90 tablet    Refill:  2    Do not place this medication, or any other prescription  from our practice, on "Automatic Refill". Patient may have prescription filled one day early if pharmacy is closed on scheduled refill date.   -Caudal ESI without sedation for chronic lumbosacral radicular pain and spinal stenosis.  Patient instructed to stop Brilinta 3 days prior.  Will review T+L MRI when patient presents for caudal epidural steroid injection without sedation.  Follow-up plan:   Return in about 4 weeks (around 03/28/2021) for caudal epidural , without sedation.   Recent Visits Date Type Provider Dept  01/31/21 Office Visit Gillis Santa, MD [facility]  12/31/20 Procedure visit Gillis Santa, MD [facility]  12/04/20 Office Visit Gillis Santa, MD [facility]  Showing recent visits within past 90 days and meeting all other requirements Today's Visits Date Type Provider Dept  02/28/21 Office Visit Gillis Santa, MD [facility]  Showing today's visits and meeting all other requirements Future Appointments No visits were found meeting these conditions. Showing future appointments within next 90 days and meeting all other requirements I discussed the assessment and treatment plan with the patient. The patient was provided an opportunity to ask questions and all were answered. The patient agreed with the plan and demonstrated an understanding of the instructions.  Patient advised to call back or seek an in-person evaluation if the symptoms or condition worsens.  Duration of encounter: 30 minutes.  Note by: Gillis Santa, MD Date: 02/28/2021; Time: 9:04 AM

## 2021-02-28 NOTE — Patient Instructions (Addendum)
Stop Brillinta 3 days prior to procedure   Caudal epidural with local (activate order) Refill Hydrocodone  Lyrica at night Preparing for your procedure (without sedation) Instructions: Oral Intake: Do not eat or drink anything for at least 3 hours prior to your procedure. Transportation: Unless otherwise stated by your physician, you may drive yourself after the procedure. Blood Pressure Medicine: Take your blood pressure medicine with a sip of water the morning of the procedure. Insulin: Take only  of your normal insulin dose. Preventing infections: Shower with an antibacterial soap the morning of your procedure. Build-up your immune system: Take 1000 mg of Vitamin C with every meal (3 times a day) the day prior to your procedure. Pregnancy: If you are pregnant, call and cancel the procedure. Sickness: If you have a cold, fever, or any active infections, call and cancel the procedure. Arrival: You must be in the facility at least 30 minutes prior to your scheduled procedure. Children: Do not bring any children with you. Dress appropriately: Bring dark clothing that you would not mind if they get stained. Valuables: Do not bring any jewelry or valuables. Procedure appointments are reserved for interventional treatments only. No Prescription Refills. No medication changes will be discussed during procedure appointments. No disability issues will be discussed.

## 2021-03-13 ENCOUNTER — Ambulatory Visit (HOSPITAL_COMMUNITY)
Admission: RE | Admit: 2021-03-13 | Discharge: 2021-03-13 | Disposition: A | Discharge status: Home / Self Care | Payer: 59 | Source: Ambulatory Visit | Attending: Neurosurgery | Admitting: Neurosurgery

## 2021-03-13 ENCOUNTER — Ambulatory Visit (HOSPITAL_COMMUNITY): Admission: RE | Admit: 2021-03-13 | Payer: 59 | Source: Ambulatory Visit

## 2021-03-13 ENCOUNTER — Ambulatory Visit (HOSPITAL_COMMUNITY)
Admission: RE | Admit: 2021-03-13 | Discharge: 2021-03-13 | Disposition: A | Discharge status: Home / Self Care | Payer: 59 | Source: Ambulatory Visit | Attending: Physician Assistant | Admitting: Physician Assistant

## 2021-03-13 ENCOUNTER — Other Ambulatory Visit: Payer: Self-pay | Admitting: Student in an Organized Health Care Education/Training Program

## 2021-03-13 ENCOUNTER — Other Ambulatory Visit: Payer: Self-pay | Admitting: Psychiatry

## 2021-03-13 ENCOUNTER — Other Ambulatory Visit: Payer: Self-pay

## 2021-03-13 DIAGNOSIS — Z950 Presence of cardiac pacemaker: Secondary | ICD-10-CM | POA: Insufficient documentation

## 2021-03-13 DIAGNOSIS — M5441 Lumbago with sciatica, right side: Secondary | ICD-10-CM | POA: Insufficient documentation

## 2021-03-13 DIAGNOSIS — R110 Nausea: Secondary | ICD-10-CM

## 2021-03-13 DIAGNOSIS — F411 Generalized anxiety disorder: Secondary | ICD-10-CM

## 2021-03-13 DIAGNOSIS — G8929 Other chronic pain: Secondary | ICD-10-CM

## 2021-03-13 DIAGNOSIS — F331 Major depressive disorder, recurrent, moderate: Secondary | ICD-10-CM

## 2021-03-14 ENCOUNTER — Ambulatory Visit (HOSPITAL_COMMUNITY): Payer: 59 | Admitting: Licensed Clinical Social Worker

## 2021-03-14 DIAGNOSIS — F331 Major depressive disorder, recurrent, moderate: Secondary | ICD-10-CM

## 2021-03-14 NOTE — Progress Notes (Unsigned)
Virtual Visit via Telephone Note  I connected with Marquisha Karlen on 03/14/21 at 11:00 AM EDT by telephone and verified that I am speaking with the correct person using two identifiers.  Location: Patient: home Provider: office   I discussed the limitations, risks, security and privacy concerns of performing an evaluation and management service by telephone and the availability of in person appointments. I also discussed with the patient that there may be a patient responsible charge related to this service. The patient expressed understanding and agreed to proceed.    I discussed the assessment and treatment plan with the patient. The patient was provided an opportunity to ask questions and all were answered. The patient agreed with the plan and demonstrated an understanding of the instructions.   The patient was advised to call back or seek an in-person evaluation if the symptoms worsen or if the condition fails to improve as anticipated.  I provided 15 minutes of non-face-to-face time during this encounter.   Harlon Ditty, LCSW    THERAPIST PROGRESS NOTE  Session Time: 11am  Participation Level: Active  Behavioral Response: NAAlertEuthymic  Type of Therapy: Individual Therapy  Treatment Goals addressed: Coping  Interventions: Supportive  Summary: Kari Hughes is a 70 y.o. female who presents with MDD. Client reported all things going well overall, no concerns about overwhelming symptoms. Client reported she continues trying to think positive and take care of her health. Client stated does not have anything to talk about today but thaks for checking in. Client reported no problems with sleep or appetite. Client planned to work on being more social which previously improved her mood and has started on this goal by staying in phone contact with multiple family members weekly.  Suicidal/Homicidal: Nowithout intent/plan  Therapist Response: Clinician checked in with  client, assessed for SI/HI/psychosis and overall level of functioning. Clinician inquired about recent stressors and changes in support system.  Plan: Return again in 3-4 weeks.  Diagnosis: Axis I:  major depressive disorder      Harlon Ditty, LCSW 03/14/2021

## 2021-03-26 ENCOUNTER — Ambulatory Visit: Payer: No Typology Code available for payment source | Admitting: Psychiatry

## 2021-03-27 ENCOUNTER — Other Ambulatory Visit: Payer: Self-pay

## 2021-03-27 ENCOUNTER — Ambulatory Visit (HOSPITAL_BASED_OUTPATIENT_CLINIC_OR_DEPARTMENT_OTHER): Payer: 59 | Admitting: Student in an Organized Health Care Education/Training Program

## 2021-03-27 ENCOUNTER — Encounter: Payer: Self-pay | Admitting: Student in an Organized Health Care Education/Training Program

## 2021-03-27 ENCOUNTER — Ambulatory Visit
Admission: RE | Admit: 2021-03-27 | Discharge: 2021-03-27 | Disposition: A | Discharge status: Home / Self Care | Payer: 59 | Source: Ambulatory Visit | Attending: Student in an Organized Health Care Education/Training Program | Admitting: Student in an Organized Health Care Education/Training Program

## 2021-03-27 VITALS — BP 113/80 | HR 80 | Temp 97.4°F | Resp 14 | Ht 62.0 in | Wt 153.0 lb

## 2021-03-27 DIAGNOSIS — M5416 Radiculopathy, lumbar region: Secondary | ICD-10-CM | POA: Diagnosis present

## 2021-03-27 DIAGNOSIS — G894 Chronic pain syndrome: Secondary | ICD-10-CM | POA: Insufficient documentation

## 2021-03-27 DIAGNOSIS — G8929 Other chronic pain: Secondary | ICD-10-CM | POA: Diagnosis not present

## 2021-03-27 MED ORDER — ROPIVACAINE HCL 2 MG/ML IJ SOLN
2.0000 mL | Freq: Once | INTRAMUSCULAR | Status: AC
  Administered 2021-03-27: 20 mL via EPIDURAL

## 2021-03-27 MED ORDER — DEXAMETHASONE SODIUM PHOSPHATE 10 MG/ML IJ SOLN
10.0000 mg | Freq: Once | INTRAMUSCULAR | Status: AC
  Administered 2021-03-27: 10 mg

## 2021-03-27 MED ORDER — LIDOCAINE HCL (PF) 2 % IJ SOLN
INTRAMUSCULAR | Status: AC
  Filled 2021-03-27: qty 5

## 2021-03-27 MED ORDER — IOHEXOL 180 MG/ML  SOLN
10.0000 mL | Freq: Once | INTRAMUSCULAR | Status: AC
  Administered 2021-03-27: 5 mL via EPIDURAL

## 2021-03-27 MED ORDER — DEXAMETHASONE SODIUM PHOSPHATE 10 MG/ML IJ SOLN
INTRAMUSCULAR | Status: AC
  Filled 2021-03-27: qty 1

## 2021-03-27 MED ORDER — ROPIVACAINE HCL 2 MG/ML IJ SOLN
INTRAMUSCULAR | Status: AC
  Filled 2021-03-27: qty 20

## 2021-03-27 MED ORDER — LIDOCAINE HCL 2 % IJ SOLN
20.0000 mL | Freq: Once | INTRAMUSCULAR | Status: AC
  Administered 2021-03-27: 100 mg

## 2021-03-27 MED ORDER — SODIUM CHLORIDE 0.9% FLUSH: 2.0000 mL | Freq: Once | INTRAVENOUS | Status: DC

## 2021-03-27 NOTE — Progress Notes (Signed)
PROVIDER NOTE: Information contained herein reflects review and annotations entered in association with encounter. Interpretation of such information and data should be left to medically-trained personnel. Information provided to patient can be located elsewhere in the medical record under "Patient Instructions". Document created using STT-dictation technology, any transcriptional errors that may result from process are unintentional.    Patient: Kari Hughes  Service Category: Procedure  Provider: Edward Jolly, MD  DOB: 02-06-1951  DOS: 03/27/2021  Location: [facility]  MRN: [id number]  Setting: Ambulatory - outpatient  Referring Provider: Enid Baas, MD  Type: Established Patient  Specialty: Interventional Pain Management  PCP: Enid Baas, MD   Primary Reason for Visit: Interventional Pain Management Treatment. CC: Back Pain (lower)  Procedure:          Anesthesia, Analgesia, Anxiolysis:  Type: Therapeutic Epidural Steroid Injection #2 (#1 done 12/31/20) Region: Caudal Level: Sacrococcygeal   Laterality: Midline       Type: Local Anesthesia  Local Anesthetic: Lidocaine 1-2%  Position: Prone   Indications: 1. Chronic radicular lumbar pain   2. Lumbar radiculopathy   3. Chronic pain syndrome     Pain Score: Pre-procedure: 10-Worst pain ever/10 Post-procedure: 4 /10   Last dose of Brilinta was 3 days ago.  Pre-op H&P Assessment:  Kari Hughes is a 70 y.o. (year old), female patient, seen today for interventional treatment. She  has a past surgical history that includes pacemaker placement (2019) and Joint replacement (Left, 2013). Kari Hughes has a current medication list which includes the following prescription(s): acetaminophen, brilinta, cyclobenzaprine, ezetimibe, hydrocodone-acetaminophen, [START ON 03/31/2021] hydrocodone-acetaminophen, [START ON 04/30/2021] hydrocodone-acetaminophen, hydroxyzine, meloxicam, pregabalin, sertraline, trazodone, and  amlodipine, and the following Facility-Administered Medications: sodium chloride flush. Her primarily concern today is the Back Pain (lower)  Initial Vital Signs:  Pulse/HCG Rate: 80ECG Heart Rate: 70 Temp: (!) 97.4 F (36.3 C) Resp: 14 BP: 130/84 SpO2: 95 %  BMI: Estimated body mass index is 27.98 kg/m as calculated from the following:   Height as of this encounter: 5\' 2"  (1.575 m).   Weight as of this encounter: 153 lb (69.4 kg).  Risk Assessment: Allergies: Reviewed. She has No Known Allergies.  Allergy Precautions: None required Coagulopathies: Reviewed. None identified.  Blood-thinner therapy: None at this time Active Infection(s): Reviewed. None identified. [name] is afebrile  Site Confirmation: [name] was asked to confirm the procedure and laterality before marking the site Procedure checklist: Completed Consent: Before the procedure and under the influence of no sedative(s), amnesic(s), or anxiolytics, the patient was informed of the treatment options, risks and possible complications. To fulfill our ethical and legal obligations, as recommended by the American Medical Association's Code of Ethics, I have informed the patient of my clinical impression; the nature and purpose of the treatment or procedure; the risks, benefits, and possible complications of the intervention; the alternatives, including doing nothing; the risk(s) and benefit(s) of the alternative treatment(s) or procedure(s); and the risk(s) and benefit(s) of doing nothing. The patient was provided information about the general risks and possible complications associated with the procedure. These may include, but are not limited to: failure to achieve desired goals, infection, bleeding, organ or nerve damage, allergic reactions, paralysis, and death. In addition, the patient was informed of those risks and complications associated to Spine-related procedures, such as failure to decrease pain; infection (i.e.:  Meningitis, epidural or intraspinal abscess); bleeding (i.e.: epidural hematoma, subarachnoid hemorrhage, or any other type of intraspinal or peri-dural bleeding); organ or nerve damage (i.e.:  Any type of peripheral nerve, nerve root, or spinal cord injury) with subsequent damage to sensory, motor, and/or autonomic systems, resulting in permanent pain, numbness, and/or weakness of one or several areas of the body; allergic reactions; (i.e.: anaphylactic reaction); and/or death. Furthermore, the patient was informed of those risks and complications associated with the medications. These include, but are not limited to: allergic reactions (i.e.: anaphylactic or anaphylactoid reaction(s)); adrenal axis suppression; blood sugar elevation that in diabetics may result in ketoacidosis or comma; water retention that in patients with history of congestive heart failure may result in shortness of breath, pulmonary edema, and decompensation with resultant heart failure; weight gain; swelling or edema; medication-induced neural toxicity; particulate matter embolism and blood vessel occlusion with resultant organ, and/or nervous system infarction; and/or aseptic necrosis of one or more joints. Finally, the patient was informed that Medicine is not an exact science; therefore, there is also the possibility of unforeseen or unpredictable risks and/or possible complications that may result in a catastrophic outcome. The patient indicated having understood very clearly. We have given the patient no guarantees and we have made no promises. Enough time was given to the patient to ask questions, all of which were answered to the patient's satisfaction. [name] has indicated that she wanted to continue with the procedure. Attestation: I, the ordering provider, attest that I have discussed with the patient the benefits, risks, side-effects, alternatives, likelihood of achieving goals, and potential problems during recovery for the  procedure that I have provided informed consent. Date  Time: 03/27/2021  9:49 AM  Pre-Procedure Preparation:  Monitoring: As per clinic protocol. Respiration, ETCO2, SpO2, BP, heart rate and rhythm monitor placed and checked for adequate function Safety Precautions: Patient was assessed for positional comfort and pressure points before starting the procedure. Time-out: I initiated and conducted the "Time-out" before starting the procedure, as per protocol. The patient was asked to participate by confirming the accuracy of the "Time Out" information. Verification of the correct person, site, and procedure were performed and confirmed by me, the nursing staff, and the patient. "Time-out" conducted as per Joint Commission's Universal Protocol (UP.01.01.01). Time: 1056  Description of Procedure:          Target Area: Caudal Epidural Canal. Approach: Midline approach. Area Prepped: Entire Posterior Sacrococcygeal Region DuraPrep (Iodine Povacrylex [0.7% available iodine] and Isopropyl Alcohol, 74% w/w) Safety Precautions: Aspiration looking for blood return was conducted prior to all injections. At no point did we inject any substances, as a needle was being advanced. No attempts were made at seeking any paresthesias. Safe injection practices and needle disposal techniques used. Medications properly checked for expiration dates. SDV (single dose vial) medications used. Description of the Procedure: Protocol guidelines were followed. The patient was placed in position over the fluoroscopy table. The target area was identified and the area prepped in the usual manner. Skin & deeper tissues infiltrated with local anesthetic. Appropriate amount of time allowed to pass for local anesthetics to take effect. The procedure needles were then advanced to the target area. Proper needle placement secured. Negative aspiration confirmed. Solution injected in intermittent fashion, asking for systemic symptoms every 0.5cc  of injectate. The needles were then removed and the area cleansed, making sure to leave some of the prepping solution back to take advantage of its long term bactericidal properties. Vitals:   03/27/21 0957 03/27/21 1100  BP: 130/84 113/80  Pulse: 80   Resp:  14  Temp: (!) 97.4 F (36.3 C)   SpO2:  95% 99%  Weight: 153 lb (69.4 kg)   Height: 5\' 2"  (1.575 m)     Start Time: 1056 hrs. End Time: 1100 hrs. Materials:  Needle(s) Type: Epidural needle Gauge: 22G Length: 3.5-in Medication(s): Please see orders for medications and dosing details. 6.5 cc solution made of 3 cc of preservative-free saline, 2.5 cc of 0.2% ropivacaine, 1 cc of Decadron 10 mg/cc.  Imaging Guidance (Spinal):          Type of Imaging Technique: Fluoroscopy Guidance (Spinal) Indication(s): Assistance in needle guidance and placement for procedures requiring needle placement in or near specific anatomical locations not easily accessible without such assistance. Exposure Time: Please see nurses notes. Contrast: Before injecting any contrast, we confirmed that the patient did not have an allergy to iodine, shellfish, or radiological contrast. Once satisfactory needle placement was completed at the desired level, radiological contrast was injected. Contrast injected under live fluoroscopy. No contrast complications. See chart for type and volume of contrast used. Fluoroscopic Guidance: I was personally present during the use of fluoroscopy. "Tunnel Vision Technique" used to obtain the best possible view of the target area. Parallax error corrected before commencing the procedure. "Direction-depth-direction" technique used to introduce the needle under continuous pulsed fluoroscopy. Once target was reached, antero-posterior, oblique, and lateral fluoroscopic projection used confirm needle placement in all planes. Images permanently stored in EMR. Interpretation: I personally interpreted the imaging intraoperatively. Adequate  needle placement confirmed in multiple planes. Appropriate spread of contrast into desired area was observed. No evidence of afferent or efferent intravascular uptake. No intrathecal or subarachnoid spread observed. Permanent images saved into the patient's record.   Post-operative Assessment:  Post-procedure Vital Signs:  Pulse/HCG Rate: 8070 Temp: (!) 97.4 F (36.3 C) Resp: 14 BP: 113/80 SpO2: 99 %  EBL: None  Complications: No immediate post-treatment complications observed by team, or reported by patient.  Note: The patient tolerated the entire procedure well. A repeat set of vitals were taken after the procedure and the patient was kept under observation following institutional policy, for this type of procedure. Post-procedural neurological assessment was performed, showing return to baseline, prior to discharge. The patient was provided with post-procedure discharge instructions, including a section on how to identify potential problems. Should any problems arise concerning this procedure, the patient was given instructions to immediately contact , at any time, without hesitation. In any case, we plan to contact the patient by telephone for a follow-up status report regarding this interventional procedure.  Comments:  No additional relevant information.  Plan of Care  Orders:  Orders Placed This Encounter  Procedures   DG PAIN [facility] C-ARM 1-60 MIN NO REPORT    Intraoperative interpretation by procedural physician at [facility].    Standing Status:   Standing    Number of Occurrences:   1    Order Specific Question:   Reason for exam:    Answer:   Assistance in needle guidance and placement for procedures requiring needle placement in or near specific anatomical locations not easily accessible without such assistance.    Chronic Opioid Analgesic:  Hydrocodone 5 mg twice daily as needed, quantity 60/month; MME equals 10    Patient instructed to restart Brilinta  tomorrow.  Medications ordered for procedure: Meds ordered this encounter  Medications   iohexol (OMNIPAQUE) 180 MG/ML injection 10 mL    Must be Myelogram-compatible. If not available, you may substitute with a water-soluble, non-ionic, hypoallergenic, myelogram-compatible radiological contrast medium.   lidocaine (XYLOCAINE) 2 % (with  pres) injection 400 mg   ropivacaine (PF) 2 mg/mL (0.2%) (NAROPIN) injection 2 mL   sodium chloride flush (NS) 0.9 % injection 2 mL   dexamethasone (DECADRON) injection 10 mg    Medications administered: We administered iohexol, lidocaine, ropivacaine (PF) 2 mg/mL (0.2%), and dexamethasone.  See the medical record for exact dosing, route, and time of administration.  Follow-up plan:   Return for Keep sch. appt.    Recent Visits Date Type Provider Dept  02/28/21 Office Visit Edward Jolly, MD [facility]  01/31/21 Office Visit Edward Jolly, MD [facility]  12/31/20 Procedure visit Edward Jolly, MD [facility]  Showing recent visits within past 90 days and meeting all other requirements Today's Visits Date Type Provider Dept  03/27/21 Procedure visit Edward Jolly, MD [facility]  Showing today's visits and meeting all other requirements Future Appointments Date Type Provider Dept  04/29/21 Appointment Edward Jolly, MD [facility]  05/28/21 Appointment Edward Jolly, MD [facility]  Showing future appointments within next 90 days and meeting all other requirements Disposition: Discharge home  Discharge (Date  Time): 03/27/2021; 1110 hrs.   Primary Care Physician: Enid Baas, MD Location: [facility] Note by: Edward Jolly, MD Date: 03/27/2021; Time: 1:01 PM  Disclaimer:  Medicine is not an exact science. The only guarantee in medicine is that nothing is guaranteed. It is important to note that the decision to proceed with this intervention was  based on the information collected from the patient. The Data and conclusions were drawn from the patient's questionnaire, the interview, and the physical examination. Because the information was provided in large part by the patient, it cannot be guaranteed that it has not been purposely or unconsciously manipulated. Every effort has been made to obtain as much relevant data as possible for this evaluation. It is important to note that the conclusions that lead to this procedure are derived in large part from the available data. Always take into account that the treatment will also be dependent on availability of resources and existing treatment guidelines, considered by other Pain Management Practitioners as being common knowledge and practice, at the time of the intervention. For Medico-Legal purposes, it is also important to point out that variation in procedural techniques and pharmacological choices are the acceptable norm. The indications, contraindications, technique, and results of the above procedure should only be interpreted and judged by a Board-Certified Interventional Pain Specialist with extensive familiarity and expertise in the same exact procedure and technique.

## 2021-03-27 NOTE — Progress Notes (Signed)
Safety precautions to be maintained throughout the outpatient stay will include: orient to surroundings, keep bed in low position, maintain call bell within reach at all times, provide assistance with transfer out of bed and ambulation.  

## 2021-03-27 NOTE — Patient Instructions (Signed)

## 2021-03-28 ENCOUNTER — Telehealth: Payer: Self-pay | Admitting: *Deleted

## 2021-03-28 NOTE — Telephone Encounter (Signed)
Attempted to call for post procedure follow-up. Message left. 

## 2021-04-02 ENCOUNTER — Other Ambulatory Visit: Payer: Self-pay

## 2021-04-02 ENCOUNTER — Emergency Department
Admission: EM | Admit: 2021-04-02 | Discharge: 2021-04-02 | Disposition: A | Discharge status: Home / Self Care | Payer: 59

## 2021-04-02 ENCOUNTER — Encounter: Payer: Self-pay | Admitting: Emergency Medicine

## 2021-04-02 DIAGNOSIS — Z5321 Procedure and treatment not carried out due to patient leaving prior to being seen by health care provider: Secondary | ICD-10-CM | POA: Insufficient documentation

## 2021-04-02 DIAGNOSIS — R110 Nausea: Secondary | ICD-10-CM | POA: Diagnosis not present

## 2021-04-02 DIAGNOSIS — H9209 Otalgia, unspecified ear: Secondary | ICD-10-CM | POA: Diagnosis not present

## 2021-04-02 DIAGNOSIS — R42 Dizziness and giddiness: Secondary | ICD-10-CM | POA: Diagnosis present

## 2021-04-02 LAB — BASIC METABOLIC PANEL
Anion gap: 11 (ref 5–15)
BUN: 15 mg/dL (ref 8–23)
CO2: 22 mmol/L (ref 22–32)
Calcium: 9 mg/dL (ref 8.9–10.3)
Chloride: 103 mmol/L (ref 98–111)
Creatinine, Ser: 0.86 mg/dL (ref 0.44–1.00)
GFR, Estimated: >60 mL/min (ref >60)
Glucose, Bld: 99 mg/dL (ref 70–99)
Potassium: 4 mmol/L (ref 3.5–5.1)
Sodium: 136 mmol/L (ref 135–145)

## 2021-04-02 LAB — CBC
HCT: 37.5 % (ref 36.0–46.0)
Hemoglobin: 12.6 g/dL (ref 12.0–15.0)
MCH: 29.2 pg (ref 26.0–34.0)
MCHC: 33.6 g/dL (ref 30.0–36.0)
MCV: 87 fL (ref 80.0–100.0)
Platelets: 308 10*3/uL (ref 150–400)
RBC: 4.31 MIL/uL (ref 3.87–5.11)
RDW: 14.7 % (ref 11.5–15.5)
WBC: 13.3 10*3/uL — ABNORMAL HIGH (ref 4.0–10.5)
nRBC: 0 % (ref 0.0–0.2)

## 2021-04-02 LAB — URINALYSIS, COMPLETE (UACMP) WITH MICROSCOPIC
Bacteria, UA: NONE SEEN
Bilirubin Urine: NEGATIVE
Glucose, UA: NEGATIVE mg/dL
Hgb urine dipstick: NEGATIVE
Ketones, ur: NEGATIVE mg/dL
Nitrite: NEGATIVE
Protein, ur: NEGATIVE mg/dL
RBC / HPF: NONE SEEN RBC/hpf (ref 0–5)
Specific Gravity, Urine: 1.01 (ref 1.005–1.030)
pH: 6 (ref 5.0–8.0)

## 2021-04-02 LAB — TROPONIN I (HIGH SENSITIVITY): Troponin I (High Sensitivity): 5 ng/L (ref <18)

## 2021-04-02 NOTE — ED Triage Notes (Signed)
Pt arrived via [facility] from home was here earlier this afternoon c/o nausea, dizziness that started this afternoon, pt also thinks she has something stuck in her throat from a few days ago and has an ear infection. Pt states when she swallows she is able to keep liquids down, pt states she feels like there is something in her throat.  Pt also reports swelling to feet, no pitting edema noted, pt reports she has fibromyalgia and chronic back pain as well and states she has had burning in her feet.

## 2021-04-02 NOTE — ED Triage Notes (Signed)
C/o nausea and dizziness x 1 hour.  VS wnl.

## 2021-04-02 NOTE — ED Notes (Signed)
Pt brought into triage and states she is wanting to leave and she feels better. Pt signed MSE

## 2021-04-03 ENCOUNTER — Emergency Department
Admission: EM | Admit: 2021-04-03 | Discharge: 2021-04-03 | Disposition: A | Discharge status: Home / Self Care | Payer: 59 | Attending: Emergency Medicine | Admitting: Emergency Medicine

## 2021-04-03 NOTE — ED Notes (Signed)
No answer when called several times from lobby 

## 2021-04-11 ENCOUNTER — Ambulatory Visit (INDEPENDENT_AMBULATORY_CARE_PROVIDER_SITE_OTHER): Payer: 59

## 2021-04-11 DIAGNOSIS — I495 Sick sinus syndrome: Secondary | ICD-10-CM

## 2021-04-11 LAB — CUP PACEART REMOTE DEVICE CHECK
Date Time Interrogation Session: 20220929065314
Implantable Lead Implant Date: 20160114
Implantable Lead Implant Date: 20160114
Implantable Lead Location: 753859
Implantable Lead Location: 753860
Implantable Lead Model: 350
Implantable Lead Model: 350
Implantable Lead Serial Number: 29732119
Implantable Lead Serial Number: 29737369
Implantable Pulse Generator Implant Date: 20160114
Pulse Gen Model: 394969
Pulse Gen Serial Number: 68455363

## 2021-04-15 ENCOUNTER — Other Ambulatory Visit: Payer: Self-pay | Admitting: Internal Medicine

## 2021-04-15 DIAGNOSIS — Z1231 Encounter for screening mammogram for malignant neoplasm of breast: Secondary | ICD-10-CM

## 2021-04-18 NOTE — Progress Notes (Signed)
Remote pacemaker transmission.   

## 2021-04-29 ENCOUNTER — Other Ambulatory Visit: Payer: Self-pay

## 2021-04-29 ENCOUNTER — Ambulatory Visit
Payer: Medicaid Other | Attending: Student in an Organized Health Care Education/Training Program | Admitting: Student in an Organized Health Care Education/Training Program

## 2021-04-29 DIAGNOSIS — G8929 Other chronic pain: Secondary | ICD-10-CM

## 2021-04-29 DIAGNOSIS — M5442 Lumbago with sciatica, left side: Secondary | ICD-10-CM | POA: Diagnosis not present

## 2021-04-29 DIAGNOSIS — G894 Chronic pain syndrome: Secondary | ICD-10-CM

## 2021-04-29 DIAGNOSIS — M5416 Radiculopathy, lumbar region: Secondary | ICD-10-CM | POA: Diagnosis not present

## 2021-04-29 DIAGNOSIS — M2550 Pain in unspecified joint: Secondary | ICD-10-CM

## 2021-04-29 DIAGNOSIS — M5441 Lumbago with sciatica, right side: Secondary | ICD-10-CM

## 2021-04-29 DIAGNOSIS — M47816 Spondylosis without myelopathy or radiculopathy, lumbar region: Secondary | ICD-10-CM

## 2021-04-29 NOTE — Progress Notes (Signed)
Patient: Kari Hughes  Service Category: E/M  Provider: Gillis Santa, MD  DOB: March 27, 1951  DOS: 04/29/2021  Location: Office  MRN: [id number]  Setting: Ambulatory outpatient  Referring Provider: Gladstone Lighter, MD  Type: Established Patient  Specialty: Interventional Pain Management  PCP: Gladstone Lighter, MD  Location: Remote location  Delivery: TeleHealth     Virtual Encounter - Pain Management PROVIDER NOTE: Information contained herein reflects review and annotations entered in association with encounter. Interpretation of such information and data should be left to medically-trained personnel. Information provided to patient can be located elsewhere in the medical record under "Patient Instructions". Document created using STT-dictation technology, any transcriptional errors that may result from process are unintentional.    Contact & Pharmacy Preferred: [phone] Home: [phone] (home) Mobile: [phone] (mobile) E-mail: No e-mail address on record  [id number] [place] [address]: [phone]: [phone]  Pre-screening  Kari Hughes offered "in-person" vs "virtual" encounter. She indicated preferring virtual for this encounter.   Reason COVID-19*  Social distancing based on CDC and AMA recommendations.   I contacted Ajooni Bathgate on 04/29/2021 via telephone.      I clearly identified myself as BGillis Santa MD. I verified that I was speaking with the correct person using two identifiers (Name: Kari Hughes and date of birth: 7February 26, 1952.  Consent I sought verbal advanced consent from [place]for virtual visit interactions. I informed Kari Hughes of possible security and privacy concerns, risks, and limitations associated with providing "not-in-person" medical evaluation and management services. I also informed Kari Hughes of the availability of "in-person" appointments. Finally, I  informed her that there would be a charge for the virtual visit and that she could be  personally, fully or partially, financially responsible for it. Ms. BDelaoexpressed understanding and agreed to proceed.   Historic Elements   Ms. CAhmoni Edgeis a 70y.o. year old, female patient evaluated today after our last contact on 03/27/2021. Kari Hughes has a past medical history of Asthma, Depression, Hypertension, Hypertension (2002), Spinal stenosis (2012), and Vertigo. She also  has a past surgical history that includes pacemaker placement (2019) and Joint replacement (Left, 2013). Ms. BPomeroyhas a current medication list which includes the following prescription(s): acetaminophen, brilinta, cyclobenzaprine, ezetimibe, hydroxyzine, meloxicam, pregabalin, sertraline, trazodone, amlodipine, hydrocodone-acetaminophen, and [START ON 04/30/2021] hydrocodone-acetaminophen. She  reports that she has been smoking cigarettes. She has never used smokeless tobacco. She reports current alcohol use of about 1.0 standard drink per week. She reports that she does not currently use drugs after having used the following drugs: Marijuana. Ms. BMixonhas No Known Allergies.   HPI  Today, she is being contacted for a post-procedure assessment.   Post-Procedure Evaluation  Procedure (03/27/2021):    Anxiolysis: Please see nurses note.  Effectiveness during initial hour after procedure (Ultra-Short Term Relief): 20 %   Local anesthetic used: Long-acting (4-6 hours) Effectiveness: Defined as any analgesic benefit obtained secondary to the administration of local anesthetics. This carries significant diagnostic value as to the etiological location, or anatomical origin, of the pain. Duration of benefit is expected to coincide with the duration of the local anesthetic used.  Effectiveness during initial 4-6 hours after procedure (Short-Term Relief): 20 %   Long-term benefit: Defined as any relief past the pharmacologic  duration of the local anesthetics.  Effectiveness past the initial 6 hours after procedure (Long-Term Relief): 20-25%  Benefits, current: Defined as benefit  present at the time of this evaluation.   Analgesia:  20-25%    Pharmacotherapy Assessment   Analgesic: Hydrocodone 5 mg twice daily as needed, quantity 60/month; MME equals 10    Monitoring: [place] PMP: PDMP reviewed during this encounter.       Pharmacotherapy: No side-effects or adverse reactions reported. Compliance: No problems identified. Effectiveness: Clinically acceptable. Plan: Refer to "POC". UDS:  Summary  Date Value Ref Range Status  10/09/2020 Note  Final    Comment:    ==================================================================== Compliance Drug Analysis, Ur ==================================================================== Test                             Result       Flag       Units  Drug Present and Declared for Prescription Verification   Tramadol                       >3425        EXPECTED   ng/mg creat   O-Desmethyltramadol            2040         EXPECTED   ng/mg creat   N-Desmethyltramadol            1410         EXPECTED   ng/mg creat    Source of tramadol is a prescription medication. O-desmethyltramadol    and N-desmethyltramadol are expected metabolites of tramadol.    Gabapentin                     PRESENT      EXPECTED   Methocarbamol                  PRESENT      EXPECTED   Guaifenesin                    PRESENT      EXPECTED    Guaifenesin may be administered as an over-the-counter or    prescription drug; it may also be present as a breakdown product of    methocarbamol.    Diphenhydramine                PRESENT      EXPECTED  Drug Present not Declared for Prescription Verification   Acetaminophen                  PRESENT      UNEXPECTED  Drug Absent but Declared for Prescription Verification   Duloxetine                     Not Detected  UNEXPECTED ==================================================================== Test                      Result    Flag   Units      Ref Range   Creatinine              146              mg/dL      >=20 ==================================================================== Declared Medications:  The flagging and interpretation on this report are based on the  following declared medications.  Unexpected results may arise from  inaccuracies in the declared medications.   **Note: The testing scope of this panel includes these medications:   Diphenhydramine (Benadryl)  Duloxetine (Cymbalta)  Gabapentin (Neurontin)  Methocarbamol (Robaxin)  Tramadol (Ultram)   **Note: The testing scope of this panel does not include the  following reported medications:   Amlodipine (Norvasc)  Ezetimibe (Zetia)  Loperamide (Imodium)  Ticagrelor (Brilinta) ==================================================================== For clinical consultation, please call [phone]. ====================================================================      Laboratory Chemistry Profile   Renal Lab Results  Component Value Date   BUN 15 04/02/2021   CREATININE 0.86 04/02/2021   BCR 21 05/08/2020   GFRAA 73 05/08/2020   GFRNONAA >60 04/02/2021    Hepatic Lab Results  Component Value Date   AST 23 07/01/2020   ALT 20 07/01/2020   ALBUMIN 3.8 07/01/2020   ALKPHOS 56 07/01/2020    Electrolytes Lab Results  Component Value Date   NA 136 04/02/2021   K 4.0 04/02/2021   CL 103 04/02/2021   CALCIUM 9.0 04/02/2021    Bone No results found for: VD25OH, VD125OH2TOT, KA7681LX7, WI2035DH7, 25OHVITD1, 25OHVITD2, 25OHVITD3, TESTOFREE, TESTOSTERONE  Inflammation (CRP: Acute Phase) (ESR: Chronic Phase) No results found for: CRP, ESRSEDRATE, LATICACIDVEN       Note: Above Lab results reviewed.  Imaging  CUP PACEART REMOTE DEVICE CHECK Scheduled remote reviewed. Normal device function.    Next remote  91 days. LR  Assessment  The primary encounter diagnosis was Chronic radicular lumbar pain. Diagnoses of Lumbar radiculopathy, Lumbar facet arthropathy, Chronic bilateral low back pain with bilateral sciatica, Chronic joint pain, and Chronic pain syndrome were also pertinent to this visit.  Plan of Care   Repeat call as needed.  Patient is out of town with her daughter in [place] who is having upcoming surgery.  She states that the caudal injection did help but thinks that more volume would be helpful as well.  We discussed spinal cord stimulation.  Patient is not a candidate as she does not have failed back surgical syndrome or CRPS; Medicaid will not cover spinal cord stimulation.  Patient states that she will contact us when she is back from [place] to schedule another caudal.  I reminded her to stop her Brilinta 3 days prior to her scheduled procedure.  She has a as needed order in place for caudal ESI which can be activated by staff when she calls.   Follow-up plan:   Return if symptoms worsen or fail to improve.    Recent Visits Date Type Provider Dept  03/27/21 Procedure visit Gillis Santa, MD [facility]  02/28/21 Office Visit Gillis Santa, MD [facility]  01/31/21 Office Visit Gillis Santa, MD [facility]  Showing recent visits within past 90 days and meeting all other requirements Today's Visits Date Type Provider Dept  04/29/21 Office Visit Gillis Santa, MD [facility]  Showing today's visits and meeting all other requirements Future Appointments Date Type Provider Dept  05/28/21 Appointment Gillis Santa, MD [facility]  Showing future appointments within next 90 days and meeting all other requirements I discussed the assessment and treatment plan with the patient. The patient was provided an opportunity to ask questions and all were answered. The patient agreed with the plan and demonstrated an understanding of  the instructions.  Patient advised to call back or seek an in-person evaluation if the symptoms or condition worsens.  Duration of encounter: 71mnutes.  Note by: BGillis Santa MD Date: 04/29/2021; Time: 2:15 PM

## 2021-05-28 ENCOUNTER — Encounter: Payer: 59 | Admitting: Student in an Organized Health Care Education/Training Program

## 2021-05-30 ENCOUNTER — Encounter: Payer: Medicaid Other | Admitting: Student in an Organized Health Care Education/Training Program

## 2021-06-04 ENCOUNTER — Ambulatory Visit
Payer: Medicaid Other | Attending: Student in an Organized Health Care Education/Training Program | Admitting: Student in an Organized Health Care Education/Training Program

## 2021-06-04 ENCOUNTER — Encounter: Payer: Self-pay | Admitting: Student in an Organized Health Care Education/Training Program

## 2021-06-04 ENCOUNTER — Other Ambulatory Visit: Payer: Self-pay

## 2021-06-04 VITALS — BP 127/91 | HR 75 | Temp 97.5°F | Resp 16 | Ht 62.0 in | Wt 150.0 lb

## 2021-06-04 DIAGNOSIS — G8929 Other chronic pain: Secondary | ICD-10-CM

## 2021-06-04 DIAGNOSIS — G894 Chronic pain syndrome: Secondary | ICD-10-CM

## 2021-06-04 DIAGNOSIS — M5416 Radiculopathy, lumbar region: Secondary | ICD-10-CM | POA: Diagnosis not present

## 2021-06-04 DIAGNOSIS — M47816 Spondylosis without myelopathy or radiculopathy, lumbar region: Secondary | ICD-10-CM

## 2021-06-04 DIAGNOSIS — M5442 Lumbago with sciatica, left side: Secondary | ICD-10-CM | POA: Diagnosis not present

## 2021-06-04 DIAGNOSIS — M5441 Lumbago with sciatica, right side: Secondary | ICD-10-CM

## 2021-06-04 DIAGNOSIS — M2550 Pain in unspecified joint: Secondary | ICD-10-CM | POA: Diagnosis not present

## 2021-06-04 MED ORDER — HYDROCODONE-ACETAMINOPHEN 5-325 MG PO TABS: 1.0000 | ORAL_TABLET | Freq: Two times a day (BID) | ORAL | 0 refills | Status: AC | PRN

## 2021-06-04 MED ORDER — HYDROCODONE-ACETAMINOPHEN 5-325 MG PO TABS: 1.0000 | ORAL_TABLET | Freq: Two times a day (BID) | ORAL | 0 refills | Status: DC | PRN

## 2021-06-04 NOTE — Progress Notes (Signed)
PROVIDER NOTE: Information contained herein reflects review and annotations entered in association with encounter. Interpretation of such information and data should be left to medically-trained personnel. Information provided to patient can be located elsewhere in the medical record under "Patient Instructions". Document created using STT-dictation technology, any transcriptional errors that may result from process are unintentional.    Patient: Kari Hughes  Service Category: E/M  Provider: Gillis Santa, MD  DOB: 1951-04-27  DOS: 06/04/2021  Specialty: Interventional Pain Management  MRN: [id number]  Setting: Ambulatory outpatient  PCP: Gladstone Lighter, MD  Type: Established Patient    Referring Provider: Gladstone Lighter, MD  Location: Office  Delivery: Face-to-face     HPI  Kari Hughes, a 70 y.o. year old female, is here today because of her Lumbar facet arthropathy [M47.816]. Kari Hughes's primary complain today is Back Pain (lower), Pain (Generalized body pain), and Foot Pain (NEW: Bilat - sharp pain) Last encounter: My last encounter with her was on 04/29/21 Pertinent problems: Kari Hughes has Fibromyalgia; Chronic pain syndrome; Bipolar disorder ([place]); Nerve pain; Lumbar degenerative disc disease; Lumbar facet arthropathy; and Spinal stenosis, lumbar region, with neurogenic claudication on their pertinent problem list. Pain Assessment: Severity of Chronic pain is reported as a 9 /10. Location: Back (Pt c/o generalized body pain; today pt presents with chief c/o lower back pain) Lower/up to neck. Onset: More than a month ago. Quality: Stabbing, Aching, Sharp. Timing: Constant. Modifying factor(s): meds. Vitals:  height is _0  (1.575 m) and weight is 150 lb (68 kg). Her temporal temperature is 97.5 F (36.4 C) (abnormal). Her blood pressure is 127/91 (abnormal) and her pulse is 75. Her respiration is 16 and oxygen saturation is 98%.   Reason for encounter: medication management.     Patient follows up today for medication management as well as increased lower back pain that radiates occasionally into bilateral legs, right greater than left.  Of note she was in [place] taking care of her daughter who recently had cardiac surgery for a tumor.  She states that this was very stressful for her.  She has been evaluated by Dr. Cari Caraway with neurosurgery.  Surgical decompression was not recommended.  He did recommend she consider spinal cord stimulation.  We had a discussion regarding that today both tobacco lumbar spinal cord stimulation which would require a subsequent permanent implant that is more helpful for appendicular pain versus peripheral nerve stimulation of lumbar medial branch which is a 60-day therapy and does not require a subsequent implant is more helpful for her low back pain related to lumbar degenerative disc disease and facet arthropathy.  Patient does not have any caregivers or family members in town.  From this standpoint, I think starting with peripheral nerve stimulation will be better and easier for the patient to manage.  She was provided with resources regarding [employer]'s peripheral nerve stimulation system.  Risks and benefits reviewed and patient would like to proceed.  We will start with the right side first.  Pharmacotherapy Assessment  Analgesic: Hydrocodone 5 mg twice daily as needed, quantity 60/month; MME equals 10    Monitoring: [place] PMP: PDMP reviewed during this encounter.       Pharmacotherapy: No side-effects or adverse reactions reported. Compliance: No problems identified. Effectiveness: Clinically acceptable.  UDS:  Summary  Date Value Ref Range Status  10/09/2020 Note  Final    Comment:    ==================================================================== Compliance Drug Analysis, Ur ==================================================================== Test  Result       Flag       Units  Drug Present  and Declared for Prescription Verification   Tramadol                       >3425        EXPECTED   ng/mg creat   O-Desmethyltramadol            2040         EXPECTED   ng/mg creat   N-Desmethyltramadol            1410         EXPECTED   ng/mg creat    Source of tramadol is a prescription medication. O-desmethyltramadol    and N-desmethyltramadol are expected metabolites of tramadol.    Gabapentin                     PRESENT      EXPECTED   Methocarbamol                  PRESENT      EXPECTED   Guaifenesin                    PRESENT      EXPECTED    Guaifenesin may be administered as an over-the-counter or    prescription drug; it may also be present as a breakdown product of    methocarbamol.    Diphenhydramine                PRESENT      EXPECTED  Drug Present not Declared for Prescription Verification   Acetaminophen                  PRESENT      UNEXPECTED  Drug Absent but Declared for Prescription Verification   Duloxetine                     Not Detected UNEXPECTED ==================================================================== Test                      Result    Flag   Units      Ref Range   Creatinine              146              mg/dL      >=20 ==================================================================== Declared Medications:  The flagging and interpretation on this report are based on the  following declared medications.  Unexpected results may arise from  inaccuracies in the declared medications.   **Note: The testing scope of this panel includes these medications:   Diphenhydramine (Benadryl)  Duloxetine (Cymbalta)  Gabapentin (Neurontin)  Methocarbamol (Robaxin)  Tramadol (Ultram)   **Note: The testing scope of this panel does not include the  following reported medications:   Amlodipine (Norvasc)  Ezetimibe (Zetia)  Loperamide (Imodium)  Ticagrelor (Brilinta) ==================================================================== For clinical  consultation, please call [phone]. ====================================================================       ROS  Constitutional: Denies any fever or chills Gastrointestinal: No reported hemesis, hematochezia, vomiting, or acute GI distress Musculoskeletal:  Low back, bilateral hip, bilateral leg pain Neurological: No reported episodes of acute onset apraxia, aphasia, dysarthria, agnosia, amnesia, paralysis, loss of coordination, or loss of consciousness  Medication Review  HYDROcodone-acetaminophen, acetaminophen, amLODipine, cyclobenzaprine, ezetimibe, hydrOXYzine, meloxicam, pregabalin, sertraline, ticagrelor, and traZODone  History Review  Allergy: Ms.  Kari Hughes has No Known Allergies. Drug: Kari Hughes  reports that she does not currently use drugs after having used the following drugs: Marijuana. Alcohol:  reports current alcohol use of about 1.0 standard drink per week. Tobacco:  reports that she has been smoking cigarettes. She has never used smokeless tobacco. Social: Kari Hughes  reports that she has been smoking cigarettes. She has never used smokeless tobacco. She reports current alcohol use of about 1.0 standard drink per week. She reports that she does not currently use drugs after having used the following drugs: Marijuana. Medical:  has a past medical history of Asthma, Depression, Hypertension, Hypertension (2002), Spinal stenosis (2012), and Vertigo. Surgical: Kari Hughes  has a past surgical history that includes pacemaker placement (2019) and Joint replacement (Left, 2013). Family: family history includes Alcohol abuse in her brother and brother; Drug abuse in her brother and brother.  Laboratory Chemistry Profile   Renal Lab Results  Component Value Date   BUN 15 04/02/2021   CREATININE 0.86 04/02/2021   BCR 21 05/08/2020   GFRAA 73 05/08/2020   GFRNONAA >60 04/02/2021     Hepatic Lab Results  Component Value Date   AST 23 07/01/2020   ALT 20 07/01/2020    ALBUMIN 3.8 07/01/2020   ALKPHOS 56 07/01/2020     Electrolytes Lab Results  Component Value Date   NA 136 04/02/2021   K 4.0 04/02/2021   CL 103 04/02/2021   CALCIUM 9.0 04/02/2021     Bone No results found for: VD25OH, VD125OH2TOT, GY6948NI6, EV0350KX3, 25OHVITD1, 25OHVITD2, 25OHVITD3, TESTOFREE, TESTOSTERONE   Inflammation (CRP: Acute Phase) (ESR: Chronic Phase) No results found for: CRP, ESRSEDRATE, LATICACIDVEN     Note: Above Lab results reviewed.   Physical Exam  General appearance: Well nourished, well developed, and well hydrated. In no apparent acute distress Mental status: Alert, oriented x 3 (person, place, & time)       Respiratory: No evidence of acute respiratory distress Eyes: PERLA Vitals: BP (!) 127/91   Pulse 75   Temp (!) 97.5 F (36.4 C) (Temporal)   Resp 16   Ht _0  (1.575 m)   Wt 150 lb (68 kg)   SpO2 98%   BMI 27.44 kg/m  BMI: Estimated body mass index is 27.44 kg/m as calculated from the following:   Height as of this encounter: _1  (1.575 m).   Weight as of this encounter: 150 lb (68 kg). Ideal: Ideal body weight: 50.1 kg (110 lb 7.2 oz) Adjusted ideal body weight: 57.3 kg (126 lb 4.3 oz)    Thoracic Spine Area Exam  Skin & Axial Inspection: No masses, redness, or swelling Alignment: Symmetrical Functional ROM: Unrestricted ROM Stability: No instability detected Muscle Tone/Strength: Functionally intact. No obvious neuro-muscular anomalies detected. Sensory (Neurological): Musculoskeletal pain pattern   Lumbar Exam  Skin & Axial Inspection: No masses, redness, or swelling Alignment: Asymmetric Functional ROM: Pain restricted ROM affecting both sides Stability: No instability detected Muscle Tone/Strength: Functionally intact. No obvious neuro-muscular anomalies detected. Sensory (Neurological): Musculoskeletal pain pattern and dermatomal   Gait & Posture Assessment  Ambulation: Patient came in today in a wheel chair Gait:  Significantly limited. Dependent on assistive device to ambulate Posture: Difficulty standing up straight, due to pain    Lower Extremity Exam      Side: Right lower extremity   Side: Left lower extremity  Stability: No instability observed           Stability: No instability observed  Skin & Extremity Inspection: Skin color, temperature, and hair growth are WNL. No peripheral edema or cyanosis. No masses, redness, swelling, asymmetry, or associated skin lesions. No contractures.   Skin & Extremity Inspection: Skin color, temperature, and hair growth are WNL. No peripheral edema or cyanosis. No masses, redness, swelling, asymmetry, or associated skin lesions. No contractures.  Functional ROM: Pain restricted ROM for hip and knee joints           Functional ROM: Pain restricted ROM for hip and knee joints          Muscle Tone/Strength: Functionally intact. No obvious neuro-muscular anomalies detected.   Muscle Tone/Strength: Functionally intact. No obvious neuro-muscular anomalies detected.  Sensory (Neurological): Musculoskeletal pain pattern         Sensory (Neurological): Musculoskeletal pain pattern        DTR: Patellar: deferred today Achilles: deferred today Plantar: deferred today   DTR: Patellar: deferred today Achilles: deferred today Plantar: deferred today  Palpation: No palpable anomalies   Palpation: No palpable anomalies   Assessment   Status Diagnosis  Persistent Persistent Persistent 1. Lumbar facet arthropathy   2. Chronic radicular lumbar pain   3. Lumbar radiculopathy   4. Chronic bilateral low back pain with bilateral sciatica   5. Chronic joint pain   6. Chronic pain syndrome           Plan of Care   Kari Hughes has a current medication list which includes the following long-term medication(s): amlodipine, ezetimibe, pregabalin, sertraline, and trazodone.  Pharmacotherapy (Medications Ordered): Meds ordered this encounter  Medications    HYDROcodone-acetaminophen (NORCO/VICODIN) 5-325 MG tablet    Sig: Take 1 tablet by mouth every 12 (twelve) hours as needed for severe pain. Must last 30 days.    Dispense:  60 tablet    Refill:  0    Chronic Pain: STOP Act (Not applicable) Fill 1 day early if closed on refill date. Avoid benzodiazepines within 8 hours of opioids   HYDROcodone-acetaminophen (NORCO/VICODIN) 5-325 MG tablet    Sig: Take 1 tablet by mouth every 12 (twelve) hours as needed for severe pain. Must last 30 days.    Dispense:  60 tablet    Refill:  0    Chronic Pain: STOP Act (Not applicable) Fill 1 day early if closed on refill date. Avoid benzodiazepines within 8 hours of opioids   SPRINT PNS of RIGHT L5 medial branch for LBP 2/2 to lumbar facet arthopathy  Orders Placed This Encounter  Procedures   Implantable Peripheral Nerve Stimulator    Standing Status:   Future    Standing Expiration Date:   08/04/2021    Scheduling Instructions:     Procedure: Temporary implantable peripheral nerve stimulator     Side:  RIGHT     Nerve site: L5 medial branch     Sedation: PO Valium     Timeframe: ASAA    Order Specific Question:   Location of Procedure    Answer:   [facility]  Patient instructed to stop Brilinta 3 days prior.  Follow-up plan:   Return in about 3 weeks (around 06/25/2021) for RIGHT SPRINT PNS, minimal sedation (PO Valium).   Recent Visits Date Type Provider Dept  04/29/21 Office Visit Gillis Santa, MD [facility]  03/27/21 Procedure visit Gillis Santa, MD [facility]  Showing recent visits within past 90 days and meeting all other requirements Today's Visits Date Type Provider Dept  06/04/21 Office Visit Gillis Santa,  MD [facility]  Showing today's visits and meeting all other requirements Future Appointments Date Type Provider Dept  07/30/21 Appointment Gillis Santa, MD [facility]  Showing future appointments within next 90 days  and meeting all other requirements I discussed the assessment and treatment plan with the patient. The patient was provided an opportunity to ask questions and all were answered. The patient agreed with the plan and demonstrated an understanding of the instructions.  Patient advised to call back or seek an in-person evaluation if the symptoms or condition worsens.  Duration of encounter: 34 minutes.  Note by: Gillis Santa, MD Date: 06/04/2021; Time: 12:56 PM

## 2021-06-04 NOTE — Progress Notes (Signed)
Safety precautions to be maintained throughout the outpatient stay will include: orient to surroundings, keep bed in low position, maintain call bell within reach at all times, provide assistance with transfer out of bed and ambulation.  

## 2021-06-04 NOTE — Patient Instructions (Signed)
You have been given a SPRINT brochure.

## 2021-06-12 ENCOUNTER — Ambulatory Visit
Admission: RE | Admit: 2021-06-12 | Discharge: 2021-06-12 | Disposition: A | Discharge status: Home / Self Care | Payer: Medicare Other | Source: Ambulatory Visit | Attending: Student in an Organized Health Care Education/Training Program | Admitting: Student in an Organized Health Care Education/Training Program

## 2021-06-12 ENCOUNTER — Other Ambulatory Visit: Payer: Self-pay

## 2021-06-12 ENCOUNTER — Encounter: Payer: Self-pay | Admitting: Student in an Organized Health Care Education/Training Program

## 2021-06-12 ENCOUNTER — Ambulatory Visit (HOSPITAL_BASED_OUTPATIENT_CLINIC_OR_DEPARTMENT_OTHER): Payer: Medicare Other | Admitting: Student in an Organized Health Care Education/Training Program

## 2021-06-12 DIAGNOSIS — M47816 Spondylosis without myelopathy or radiculopathy, lumbar region: Secondary | ICD-10-CM

## 2021-06-12 DIAGNOSIS — G8929 Other chronic pain: Secondary | ICD-10-CM | POA: Diagnosis present

## 2021-06-12 DIAGNOSIS — M5441 Lumbago with sciatica, right side: Secondary | ICD-10-CM | POA: Diagnosis present

## 2021-06-12 DIAGNOSIS — M5442 Lumbago with sciatica, left side: Secondary | ICD-10-CM | POA: Diagnosis present

## 2021-06-12 DIAGNOSIS — G894 Chronic pain syndrome: Secondary | ICD-10-CM | POA: Diagnosis not present

## 2021-06-12 MED ORDER — LIDOCAINE HCL 2 % IJ SOLN
20.0000 mL | Freq: Once | INTRAMUSCULAR | Status: AC
  Administered 2021-06-12: 400 mg

## 2021-06-12 MED ORDER — ROPIVACAINE HCL 2 MG/ML IJ SOLN
2.0000 mL | Freq: Once | INTRAMUSCULAR | Status: AC
  Administered 2021-06-12: 2 mL via EPIDURAL

## 2021-06-12 MED ORDER — ROPIVACAINE HCL 2 MG/ML IJ SOLN
INTRAMUSCULAR | Status: AC
  Filled 2021-06-12: qty 20

## 2021-06-12 MED ORDER — LIDOCAINE HCL 2 % IJ SOLN
INTRAMUSCULAR | Status: AC
  Filled 2021-06-12: qty 10

## 2021-06-12 NOTE — Progress Notes (Signed)
PROVIDER NOTE: Information contained herein reflects review and annotations entered in association with encounter. Interpretation of such information and data should be left to medically-trained personnel. Information provided to patient can be located elsewhere in the medical record under "Patient Instructions". Document created using STT-dictation technology, any transcriptional errors that may result from process are unintentional.    Patient: Kari Hughes  Service Category: Procedure Provider: Edward Jolly, MD DOB: 02-22-1951 DOS: 06/12/2021 Location: [facility] MRN: [id number] Setting: Ambulatory - outpatient Referring Provider: Edward Jolly, MD Type: Established Patient Specialty: Interventional Pain Management PCP: Enid Baas, MD  Primary Reason for Admission: Surgical management of chronic pain condition.  Procedure:  Anesthesia, Analgesia, Anxiolysis:  Type: SPRINTTM Peripheral Nerve Field Stimulator (PNS) MicroLeadTM Implant Purpose: Therapeutic Region: Lumbar Level: L4-5 Facet Medial Branch Nerve Laterality: Right           Anesthesia: Local (1-2% Lidocaine)  Anxiolysis: None  Sedation: None  Guidance: Fluoroscopy            Indications: 1. Lumbar facet arthropathy   2. Chronic bilateral low back pain with bilateral sciatica   3. Chronic pain syndrome    Pain Score: Pre-procedure: 6 /10 Post-procedure: 6 /10   Patient stopped Brilinta 3 days prior.   Pre-op H&P Assessment:  Kari Hughes is a 70 y.o. (year old), female patient, seen today for interventional treatment. She  has a past surgical history that includes pacemaker placement (2019) and Joint replacement (Left, 2013).  Initial Vital Signs:  Pulse/EKG Rate: 86  Temp: (!) 97.2 F (36.2 C) Resp: 16 BP: (!) 151/94 SpO2: 98 %  BMI: Estimated body mass index is 27.44 kg/m as calculated from the following:   Height as of this encounter: 5\' 2"  (1.575 m).   Weight as of this encounter:  150 lb (68 kg).  Risk Assessment: Allergies: Reviewed. She has No Known Allergies.  Allergy Precautions: None required Coagulopathies: Reviewed. None identified.  Blood-thinner therapy: None at this time Active Infection(s): Reviewed. None identified. Kari Hughes is afebrile  Site Confirmation: Kari Hughes was asked to confirm the procedure and laterality before marking the site, which she did. Procedure checklist: Completed Consent: Before the procedure and under the influence of no sedative(s), amnesic(s), or anxiolytics, the patient was informed of the treatment options, risks and possible complications. To fulfill our ethical and legal obligations, as recommended by the American Medical Association's Code of Ethics, I have informed the patient of my clinical impression; the nature and purpose of the treatment or procedure; the risks, benefits, and possible complications of the intervention; the alternatives, including doing nothing; the risk(s) and benefit(s) of the alternative treatment(s) or procedure(s); and the risk(s) and benefit(s) of doing nothing.  Kari Hughes was provided with information about the general risks and possible complications associated with most interventional procedures. These include, but are not limited to: failure to achieve desired goals, infection, bleeding, organ or nerve damage, allergic reactions, paralysis, and/or death.  In addition, she was informed of those risks and possible complications associated to this particular procedure, which include, but are not limited to: damage to the implant; failure to decrease pain; local, systemic, or serious CNS infections, intraspinal abscess with possible cord compression and paralysis, or life-threatening such as meningitis; intrathecal and/or epidural bleeding with formation of hematoma with possible spinal cord compression and permanent paralysis; organ damage; nerve injury or damage with subsequent sensory, motor, and/or  autonomic system dysfunction, resulting in transient or permanent pain, numbness, and/or weakness of one or  several areas of the body; allergic reactions, either minor or major life-threatening, such as anaphylactic or anaphylactoid reactions.  Furthermore, Kari Hughes was informed of those risks and complications associated with the medications. These include, but are not limited to: allergic reactions (i.e.: anaphylactic or anaphylactoid reactions); arrhythmia;  Hypotension/hypertension; cardiovascular collapse; respiratory depression and/or shortness of breath; swelling or edema; medication-induced neural toxicity; particulate matter embolism and blood vessel occlusion with resultant organ, and/or nervous system infarction and permanent paralysis.  Finally, she was informed that Medicine is not an exact science; therefore, there is also the possibility of unforeseen or unpredictable risks and/or possible complications that may result in a catastrophic outcome. The patient indicated having understood very clearly. We have given the patient no guarantees and we have made no promises. Enough time was given to the patient to ask questions, all of which were answered to the patient's satisfaction. Kari Hughes has indicated that she wanted to continue with the procedure. Attestation: I, the ordering provider, attest that I have discussed with the patient the benefits, risks, side-effects, alternatives, likelihood of achieving goals, and potential problems during recovery for the procedure that I have provided informed consent. Date  Time: 06/12/2021  8:04 AM  Pre-Procedure Preparation:  Monitoring: As per clinic protocol. Respiration, ETCO2, SpO2, BP, heart rate and rhythm monitor placed and checked for adequate function Safety Precautions: Patient was assessed for positional comfort and pressure points before starting the procedure. Time-out: I initiated and conducted the "Time-out" before starting the  procedure, as per protocol. The patient was asked to participate by confirming the accuracy of the "Time Out" information. Verification of the correct person, site, and procedure were performed and confirmed by me, the nursing staff, and the patient. "Time-out" conducted as per Joint Commission's Universal Protocol (UP.01.01.01). Time: 70  Description of Procedure Process:   Position: Prone Target Area: <1 cm from targeted nerve (Medial Branch Nerve) RIGHT L4 Medial Branch Approach: Posterior percutaneous, paramedial, interlaminar approach Area Prepped: Entire Lumbar Region Prepping solution: ChloraPrep (2% chlorhexidine gluconate and 70% isopropyl alcohol) Safety Precautions: Safe injection practices and needle disposal techniques used. Medications properly checked for expiration dates. SDV (single dose vial) medications used. Aspiration looking for blood return and/or CSF was conducted prior to all injections. At no point did I inject any substances, as a needle was being advanced. No attempts were made at seeking any paresthesias.  Description of the Procedure (Lumbar Medial Branch):  Availability of a responsible, adult driver, and NPO status confirmed. Informed consent was obtained after having discussed risks and possible complications. An IV was started. The patient was then taken to the fluoroscopy suite, where the patient was placed in position for the procedure, over the fluoroscopy table. The patient was then monitored in the usual manner. Fluoroscopy was manipulated to obtain the best possible view of the target. Parallex error was corrected before commencing the procedure. Once a clear view of the target had been obtained, the skin and subcutaneous tissue over the procedure site were infiltrated using lidocaine, loaded in a 10 cc luer-loc syringe with a 0.5 inch, 25-G needle. Care was taken to avoid numbing deeper tissues The introducer needle(s) was/were then inserted through the skin  and deeper tissues, specifically the multifidus muscle.  An introducer needle and stimulating probe were  assembled, inserted and advanced along the intended course of the medial branch nerve as it traverses the lamina medial and inferior to the zygapophyseal joint, taking care to maintain the proper depth of insertion as  the introducer is advanced under fluoroscopic. The introducer needle was delivered to a location in proximity to the nerve. Multiple stimulation parameters were used to deliver stimulation to the medial branch nerve in concert with stimulating at multiple positions around the nerve. Nerve target acquisition was confirmed noting generation of paresthesias in the paravertebral regions corresponding to the level being stimulated as well as rhythmic thumping within the multifidi, the latter being further corroborated via palpation.  Various electrical parameter combinations were tested, and the lead location was adjusted (physically relocated) until the patient indicated paresthesia or muscle tension overlapping the distribution of the patient's typical region of pain.  The stimulating probe was removed from the introducer and a percutaneous lead was guided through the needle and delivered to a location in similar proximity to the nerve. Final  location was verified with electrical stimulation and documented with fluoroscopy & ultrasound.  The introducer needle was removed, and the exposed end of the percutaneous lead was attached to an external stimulator unit. Various electrical parameter combinations were again  tested until the patient indicated paresthesia or muscle tension  overlapping the distribution of the patient's typical region of pain.  After confirming that lead impedance was in the normal range, the external unit was detached, the needle was removed, and the lead was anchored at the skin. The lead was threaded into the connector block and electrical continuity and desired patient  response was confirmed. The connector block was attached to the external  stimulator unit.The site was covered with a sterile occlusive dressing and  a fluoroscopic and ultrasound image was taken to document final placement. The patient was observed for stability of vital signs and comfort.  Vitals:   06/12/21 0808 06/12/21 0840 06/12/21 0850 06/12/21 0900  BP: (!) 151/94 (!) 168/107 (!) 160/110 (!) 161/71  Pulse: 86 82 82 77  Resp: Temp: (!) 97.2 F (36.2 C)     SpO2: 98% 99% 97% 96%  Weight: 150 lb (68 kg)     Height:  (1.575 m)     PF: 99 L/min      Start Time: 0845 hrs. End Time: 0858 (taping area) hrs.  Imaging Guidance (Spinal):          Type of Imaging Technique: Fluoroscopy Guidance (Spinal) and ultrasound guidance to confirm multifidus muscle activation after lead placement adjacent to medial branch nerve. Indication(s): Assistance in needle guidance and placement for procedures requiring needle placement in or near specific anatomical locations not easily accessible without such assistance. Exposure Time: Please see nurses notes. Contrast: None used. Fluoroscopic Guidance: I was personally present during the use of fluoroscopy. "Tunnel Vision Technique" used to obtain the best possible view of the target area. Parallax error corrected before commencing the procedure. "Direction-depth-direction" technique used to introduce the needle under continuous pulsed fluoroscopy. Once target was reached, antero-posterior, oblique, and lateral fluoroscopic projection used confirm needle placement in all planes. Images permanently stored in EMR. Interpretation: No contrast injected. I personally interpreted the imaging intraoperatively. Adequate needle placement confirmed in multiple planes. Permanent images saved into the patient's record.    Post-operative Assessment:  Post-procedure Vital Signs:  Pulse/HCG Rate: 77 (nsr)  Temp: (!) 97.2 F (36.2 C) Resp: 16 BP: (!)  161/71 SpO2: 96 %  Complications: No immediate post-treatment complications observed by team, or reported by patient.  Note: The patient tolerated the entire procedure well. A repeat set of vitals were taken after the procedure and the patient was  kept under observation following institutional policy, for this type of procedure. Post-procedural neurological assessment was performed, showing return to baseline, prior to discharge. The patient was provided with post-procedure discharge instructions, including a section on how to identify potential problems. Should any problems arise concerning this procedure, the patient was given instructions to immediately contact us, at any time, without hesitation. In any case, we plan to contact the patient by telephone for a follow-up status report regarding this interventional procedure.  Comments:  No additional relevant information.  Plan of Care  Orders:  Orders Placed This Encounter  Procedures   DG PAIN CLIN[facility] C-ARM 1-60 MIN NO REPORT    Intraoperative interpretation by procedural physician at [facility].    Standing Status:   Standing    Number of Occurrences:   1    Order Specific Question:   Reason for exam:    Answer:   Assistance in needle guidance and placement for procedures requiring needle placement in or near specific anatomical locations not easily accessible without such assistance.   Chronic Opioid Analgesic:  Hydrocodone 5 mg twice daily as needed, quantity 60/month; MME equals 10    Medications administered: We administered lidocaine and ropivacaine (PF) 2 mg/mL (0.2%).  See the medical record for exact dosing, route, and time of administration.  Follow-up plan:   Return in about 8 weeks (around 08/07/2021) for PNS lead pull.     Recent Visits Date Type Provider Dept  06/04/21 Office Visit Edward Jolly, MD [facility]  04/29/21 Office Visit Edward Jolly, MD [facility]  03/27/21 Procedure  visit Edward Jolly, MD [facility]  Showing recent visits within past 90 days and meeting all other requirements Today's Visits Date Type Provider Dept  06/12/21 Procedure visit Edward Jolly, MD [facility]  Showing today's visits and meeting all other requirements Future Appointments Date Type Provider Dept  07/30/21 Appointment Edward Jolly, MD [facility]  08/07/21 Appointment Edward Jolly, MD [facility]  Showing future appointments within next 90 days and meeting all other requirements Disposition: Discharge home  Discharge (Date  Time): 06/12/2021; 0923 hrs.   Primary Care Physician: Enid Baas, MD Location: [facility] Note by: Edward Jolly, MD Date: 06/12/2021; Time: 11:34 AM

## 2021-06-12 NOTE — Progress Notes (Signed)
Safety precautions to be maintained throughout the outpatient stay will include: orient to surroundings, keep bed in low position, maintain call bell within reach at all times, provide assistance with transfer out of bed and ambulation.  

## 2021-06-13 ENCOUNTER — Telehealth: Payer: Self-pay | Admitting: *Deleted

## 2021-06-13 NOTE — Telephone Encounter (Signed)
States PNS became "disconnected". I instructed her to call the PNS representative.

## 2021-06-24 ENCOUNTER — Telehealth: Payer: Self-pay | Admitting: *Deleted

## 2021-06-24 NOTE — Telephone Encounter (Signed)
Patient called and states that her lead from her PNS came out about a week ago while she was bathing in the tub. She did contact [facility] and [facility] has spoken with Dr. Cherylann Ratel. Denies any fever or S/S infection at PNS site. States lead appears to be intact. Per Dr. Cherylann Ratel advise, no need to follow up for lead pull and patient to come in for her scheduled MM appointment in January. Bubble sent to [facility] to take patient off of her 01/25 appointment for lead pull. Patient to call for any new concerns or issues with PNS.

## 2021-07-04 LAB — CUP PACEART REMOTE DEVICE CHECK
Date Time Interrogation Session: 20221222081338
Implantable Lead Implant Date: 20160114
Implantable Lead Implant Date: 20160114
Implantable Lead Location: 753859
Implantable Lead Location: 753860
Implantable Lead Model: 350
Implantable Lead Model: 350
Implantable Lead Serial Number: 29732119
Implantable Lead Serial Number: 29737369
Implantable Pulse Generator Implant Date: 20160114
Pulse Gen Model: 394969
Pulse Gen Serial Number: 68455363

## 2021-07-11 ENCOUNTER — Ambulatory Visit (INDEPENDENT_AMBULATORY_CARE_PROVIDER_SITE_OTHER): Payer: Medicare Other

## 2021-07-11 DIAGNOSIS — I495 Sick sinus syndrome: Secondary | ICD-10-CM

## 2021-07-23 NOTE — Progress Notes (Signed)
Remote pacemaker transmission.   

## 2021-07-30 ENCOUNTER — Encounter: Payer: Self-pay | Admitting: Student in an Organized Health Care Education/Training Program

## 2021-07-30 ENCOUNTER — Other Ambulatory Visit: Payer: Self-pay

## 2021-07-30 ENCOUNTER — Ambulatory Visit
Payer: 59 | Attending: Student in an Organized Health Care Education/Training Program | Admitting: Student in an Organized Health Care Education/Training Program

## 2021-07-30 VITALS — BP 159/93 | HR 75 | Temp 97.0°F | Resp 16 | Ht 62.0 in | Wt 150.0 lb

## 2021-07-30 DIAGNOSIS — M47816 Spondylosis without myelopathy or radiculopathy, lumbar region: Secondary | ICD-10-CM | POA: Insufficient documentation

## 2021-07-30 DIAGNOSIS — M5442 Lumbago with sciatica, left side: Secondary | ICD-10-CM | POA: Insufficient documentation

## 2021-07-30 DIAGNOSIS — G894 Chronic pain syndrome: Secondary | ICD-10-CM | POA: Insufficient documentation

## 2021-07-30 DIAGNOSIS — M5441 Lumbago with sciatica, right side: Secondary | ICD-10-CM | POA: Diagnosis present

## 2021-07-30 DIAGNOSIS — R110 Nausea: Secondary | ICD-10-CM | POA: Insufficient documentation

## 2021-07-30 DIAGNOSIS — G8929 Other chronic pain: Secondary | ICD-10-CM | POA: Insufficient documentation

## 2021-07-30 DIAGNOSIS — M5416 Radiculopathy, lumbar region: Secondary | ICD-10-CM | POA: Insufficient documentation

## 2021-07-30 DIAGNOSIS — M48062 Spinal stenosis, lumbar region with neurogenic claudication: Secondary | ICD-10-CM | POA: Diagnosis present

## 2021-07-30 MED ORDER — HYDROCODONE-ACETAMINOPHEN 5-325 MG PO TABS: 1.0000 | ORAL_TABLET | Freq: Two times a day (BID) | ORAL | 0 refills | Status: AC | PRN

## 2021-07-30 NOTE — Progress Notes (Signed)
Nursing Pain Medication Assessment:  Safety precautions to be maintained throughout the outpatient stay will include: orient to surroundings, keep bed in low position, maintain call bell within reach at all times, provide assistance with transfer out of bed and ambulation.  Medication Inspection Compliance: Pill count conducted under aseptic conditions, in front of the patient. Neither the pills nor the bottle was removed from the patient's sight at any time. Once count was completed pills were immediately returned to the patient in their original bottle.  Medication: Hydrocodone/APAP Pill/Patch Count:  15 of 60 pills remain Pill/Patch Appearance: Markings consistent with prescribed medication Bottle Appearance: Standard pharmacy container. Clearly labeled. Filled Date: 60 / 22 / 2022 Last Medication intake:  Today

## 2021-07-30 NOTE — Progress Notes (Signed)
PROVIDER NOTE: Information contained herein reflects review and annotations entered in association with encounter. Interpretation of such information and data should be left to medically-trained personnel. Information provided to patient can be located elsewhere in the medical record under "Patient Instructions". Document created using STT-dictation technology, any transcriptional errors that may result from process are unintentional.    Patient: Kari Hughes  Service Category: E/M  Provider: Gillis Santa, MD  DOB: [birth year]  DOS: 07/30/2021  Specialty: Interventional Pain Management  MRN: [id number]  Setting: Ambulatory outpatient  PCP: Gladstone Lighter, MD  Type: Established Patient    Referring Provider: Gladstone Lighter, MD  Location: Office  Delivery: Face-to-face     HPI  [name], a 71 y.o. year old female, is here today because of her Lumbar facet arthropathy [M47.816]. [name]'s primary complain today is Back Pain (lower), Leg Pain (right), and Headache  Last encounter: My last encounter with her was on 01/31/2021. Pertinent problems: [name] has Fibromyalgia; Chronic pain syndrome; Bipolar disorder ([place]); Nerve pain; Lumbar degenerative disc disease; Lumbar facet arthropathy; and Spinal stenosis, lumbar region, with neurogenic claudication on their pertinent problem list. Pain Assessment: Severity of   is reported as a 8 /10. Location:   Low back/ . Onset: Greater than 3 months ago. Quality: Aching, throbbing. Timing: Worse when standing up and exertion. Modifying factor(s):  Marland Kitchen  Improved with medications, rest, bathing Vitals:  height is 5' 2" (1.575 m) and weight is 150 lb (68 kg). Her temporal temperature is 97 F (36.1 C) (abnormal). Her blood pressure is 159/93 (abnormal) and her pulse is 75. Her respiration is 16 and oxygen saturation is 98%.   Reason for encounter: medication management.    No significant changes in Kari Hughes's medical history since her last  clinic visit.   Patient states that her Sprint peripheral nerve stimulator lead came out while she was bathing. Site appears clean with no residual lead tip felt or seen Continues to have balance issues.  Do not recommend escalation of hydrocodone at this time.    Pharmacotherapy Assessment  Analgesic: Hydrocodone 5 mg twice daily as needed, quantity 60/month; MME equals 10    Monitoring: [place] PMP: PDMP reviewed during this encounter.       Pharmacotherapy: No side-effects or adverse reactions reported. Compliance: No problems identified. Effectiveness: Clinically acceptable.  UDS:  Summary  Date Value Ref Range Status  10/09/2020 Note  Final    Comment:    ==================================================================== Compliance Drug Analysis, Ur ==================================================================== Test                             Result       Flag       Units  Drug Present and Declared for Prescription Verification   Tramadol                       >3425        EXPECTED   ng/mg creat   O-Desmethyltramadol            2040         EXPECTED   ng/mg creat   N-Desmethyltramadol            1410         EXPECTED   ng/mg creat    Source of tramadol is a prescription medication. O-desmethyltramadol    and N-desmethyltramadol are expected metabolites of tramadol.    Gabapentin  PRESENT      EXPECTED   Methocarbamol                  PRESENT      EXPECTED   Guaifenesin                    PRESENT      EXPECTED    Guaifenesin may be administered as an over-the-counter or    prescription drug; it may also be present as a breakdown product of    methocarbamol.    Diphenhydramine                PRESENT      EXPECTED  Drug Present not Declared for Prescription Verification   Acetaminophen                  PRESENT      UNEXPECTED  Drug Absent but Declared for Prescription Verification   Duloxetine                     Not Detected  UNEXPECTED ==================================================================== Test                      Result    Flag   Units      Ref Range   Creatinine              146              mg/dL      >=20 ==================================================================== Declared Medications:  The flagging and interpretation on this report are based on the  following declared medications.  Unexpected results may arise from  inaccuracies in the declared medications.   **Note: The testing scope of this panel includes these medications:   Diphenhydramine (Benadryl)  Duloxetine (Cymbalta)  Gabapentin (Neurontin)  Methocarbamol (Robaxin)  Tramadol (Ultram)   **Note: The testing scope of this panel does not include the  following reported medications:   Amlodipine (Norvasc)  Ezetimibe (Zetia)  Loperamide (Imodium)  Ticagrelor (Brilinta) ==================================================================== For clinical consultation, please call [phone]. ====================================================================       ROS  Constitutional: Denies any fever or chills Gastrointestinal: No reported hemesis, hematochezia, vomiting, or acute GI distress Musculoskeletal:  Low back, bilateral hip, bilateral leg pain Neurological: No reported episodes of acute onset apraxia, aphasia, dysarthria, agnosia, amnesia, paralysis, loss of coordination, or loss of consciousness  Medication Review  HYDROcodone-acetaminophen, acetaminophen, amLODipine, cyclobenzaprine, ezetimibe, hydrOXYzine, meloxicam, pregabalin, sertraline, ticagrelor, and traZODone  History Review  Allergy: [name] has No Known Allergies. Drug: [name]  reports that she does not currently use drugs after having used the following drugs: Marijuana. Alcohol:  reports current alcohol use of about 1.0 standard drink per week. Tobacco:  reports that she has been smoking cigarettes. She has never used smokeless  tobacco. Social: [name]  reports that she has been smoking cigarettes. She has never used smokeless tobacco. She reports current alcohol use of about 1.0 standard drink per week. She reports that she does not currently use drugs after having used the following drugs: Marijuana. Medical:  has a past medical history of Asthma, Depression, Hypertension, Hypertension (2002), Spinal stenosis (2012), and Vertigo. Surgical: Ms. Franze  has a past surgical history that includes pacemaker placement (2019) and Joint replacement (Left, 2013). Family: family history includes Alcohol abuse in her brother and brother; Drug abuse in her brother and brother.  Laboratory Chemistry Profile   Renal Lab Results  Component Value Date   BUN 15 04/02/2021   CREATININE 0.86 04/02/2021   BCR 21 05/08/2020   GFRAA 73 05/08/2020   GFRNONAA >60 04/02/2021     Hepatic Lab Results  Component Value Date   AST 23 07/01/2020   ALT 20 07/01/2020   ALBUMIN 3.8 07/01/2020   ALKPHOS 56 07/01/2020     Electrolytes Lab Results  Component Value Date   NA 136 04/02/2021   K 4.0 04/02/2021   CL 103 04/02/2021   CALCIUM 9.0 04/02/2021     Bone No results found for: VD25OH, VD125OH2TOT, WS5681EX5, TZ0017CB4, 25OHVITD1, 25OHVITD2, 25OHVITD3, TESTOFREE, TESTOSTERONE   Inflammation (CRP: Acute Phase) (ESR: Chronic Phase) No results found for: CRP, ESRSEDRATE, LATICACIDVEN     Note: Above Lab results reviewed.   Physical Exam  General appearance: Well nourished, well developed, and well hydrated. In no apparent acute distress Mental status: Alert, oriented x 3 (person, place, & time)       Respiratory: No evidence of acute respiratory distress Eyes: PERLA Vitals: BP (!) 159/93    Pulse 75    Temp (!) 97 F (36.1 C) (Temporal)    Resp 16    Ht 5' 2" (1.575 m)    Wt 150 lb (68 kg)    SpO2 98%    BMI 27.44 kg/m  BMI: Estimated body mass index is 27.44 kg/m as calculated from the following:   Height as of this  encounter: 5' 2" (1.575 m).   Weight as of this encounter: 150 lb (68 kg). Ideal: Ideal body weight: 50.1 kg (110 lb 7.2 oz) Adjusted ideal body weight: 57.3 kg (126 lb 4.3 oz)    Thoracic Spine Area Exam  Skin & Axial Inspection: No masses, redness, or swelling Alignment: Symmetrical Functional ROM: Unrestricted ROM Stability: No instability detected Muscle Tone/Strength: Functionally intact. No obvious neuro-muscular anomalies detected. Sensory (Neurological): Musculoskeletal pain pattern   Lumbar Exam  Skin & Axial Inspection: No masses, redness, or swelling Alignment: Asymmetric Functional ROM: Pain restricted ROM affecting both sides Stability: No instability detected Muscle Tone/Strength: Functionally intact. No obvious neuro-muscular anomalies detected. Sensory (Neurological): Musculoskeletal pain pattern and dermatomal   Gait & Posture Assessment  Ambulation: Patient came in today in a wheel chair Gait: Significantly limited. Dependent on assistive device to ambulate Posture: Difficulty standing up straight, due to pain    Lower Extremity Exam      Side: Right lower extremity   Side: Left lower extremity  Stability: No instability observed           Stability: No instability observed          Skin & Extremity Inspection: Skin color, temperature, and hair growth are WNL. No peripheral edema or cyanosis. No masses, redness, swelling, asymmetry, or associated skin lesions. No contractures.   Skin & Extremity Inspection: Skin color, temperature, and hair growth are WNL. No peripheral edema or cyanosis. No masses, redness, swelling, asymmetry, or associated skin lesions. No contractures.  Functional ROM: Pain restricted ROM for hip and knee joints           Functional ROM: Pain restricted ROM for hip and knee joints          Muscle Tone/Strength: Functionally intact. No obvious neuro-muscular anomalies detected.   Muscle Tone/Strength: Functionally intact. No obvious  neuro-muscular anomalies detected.  Sensory (Neurological): Musculoskeletal pain pattern         Sensory (Neurological): Musculoskeletal pain pattern        DTR: Patellar: deferred  today Achilles: deferred today Plantar: deferred today   DTR: Patellar: deferred today Achilles: deferred today Plantar: deferred today  Palpation: No palpable anomalies   Palpation: No palpable anomalies   Assessment   Status Diagnosis  Persistent Persistent Persistent 1. Lumbar facet arthropathy   2. Chronic bilateral low back pain with bilateral sciatica   3. Chronic radicular lumbar pain   4. Lumbar radiculopathy   5. Spinal stenosis, lumbar region, with neurogenic claudication   6. Nausea without vomiting   7. Chronic pain syndrome           Plan of Care   Ms. Asiah Lavalle has a current medication list which includes the following long-term medication(s): amlodipine, ezetimibe, pregabalin, sertraline, and trazodone.  Pharmacotherapy (Medications Ordered): Meds ordered this encounter  Medications   HYDROcodone-acetaminophen (NORCO/VICODIN) 5-325 MG tablet    Sig: Take 1 tablet by mouth every 12 (twelve) hours as needed for severe pain. Must last 30 days.    Dispense:  60 tablet    Refill:  0    Chronic Pain: STOP Act (Not applicable) Fill 1 day early if closed on refill date. Avoid benzodiazepines within 8 hours of opioids   Follow-up plan:   Return in about 2 months (around 10/10/2021) for Medication Management, in person.   Recent Visits Date Type Provider Dept  06/12/21 Procedure visit Gillis Santa, MD [facility]  06/04/21 Office Visit Gillis Santa, MD [facility]  Showing recent visits within past 90 days and meeting all other requirements Today's Visits Date Type Provider Dept  07/30/21 Office Visit Gillis Santa, MD [facility]  Showing today's visits and meeting all other requirements Future Appointments Date Type Provider Dept  10/03/21  Appointment Gillis Santa, MD [facility]  Showing future appointments within next 90 days and meeting all other requirements  I discussed the assessment and treatment plan with the patient. The patient was provided an opportunity to ask questions and all were answered. The patient agreed with the plan and demonstrated an understanding of the instructions.  Patient advised to call back or seek an in-person evaluation if the symptoms or condition worsens.  Duration of encounter: 30 minutes.  Note by: Gillis Santa, MD Date: 07/30/2021; Time: 9:59 AM

## 2021-07-31 ENCOUNTER — Telehealth: Payer: Self-pay | Admitting: Student in an Organized Health Care Education/Training Program

## 2021-07-31 NOTE — Telephone Encounter (Signed)
Patient informed that PA request has been submitted.

## 2021-08-06 ENCOUNTER — Other Ambulatory Visit: Payer: Self-pay | Admitting: Student in an Organized Health Care Education/Training Program

## 2021-08-06 ENCOUNTER — Telehealth: Payer: Self-pay | Admitting: Student in an Organized Health Care Education/Training Program

## 2021-08-06 MED ORDER — PREGABALIN 25 MG PO CAPS: 25.0000 mg | ORAL_CAPSULE | Freq: Every day | ORAL | 5 refills | Status: DC

## 2021-08-06 NOTE — Telephone Encounter (Signed)
Last prescribed 02-28-21, with 2 refills

## 2021-08-07 ENCOUNTER — Ambulatory Visit: Payer: Medicaid Other | Admitting: Student in an Organized Health Care Education/Training Program

## 2021-08-19 IMAGING — CT CT L SPINE W/O CM
3 series · 12 of 33 positions shown, 14 images · non-contrast
Comparison: Plain films lumbar spine today.

CLINICAL DATA: Low back pain. Right leg weakness. No known injury.

EXAM:
CT LUMBAR SPINE WITHOUT CONTRAST
TECHNIQUE: Multidetector CT imaging of the lumbar spine was performed without
intravenous contrast administration. Multiplanar CT image
reconstructions were also generated.

[Series 4: l spine soft · axial · 0.29mm/px · z∈[-213,-57]mm · 4 of 114 slices shown, 5 images]
[im 18/114  soft-tissue]
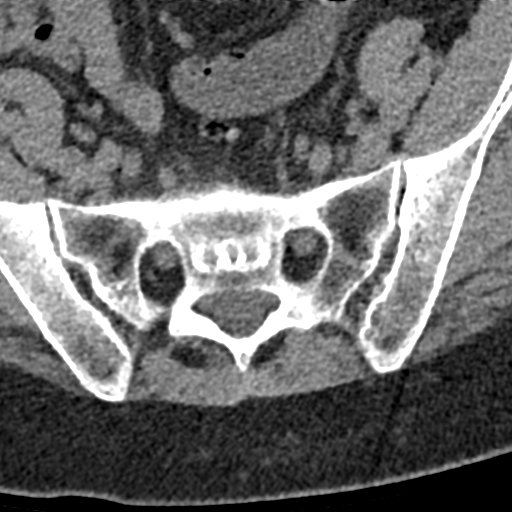
[im 18/114  bone]
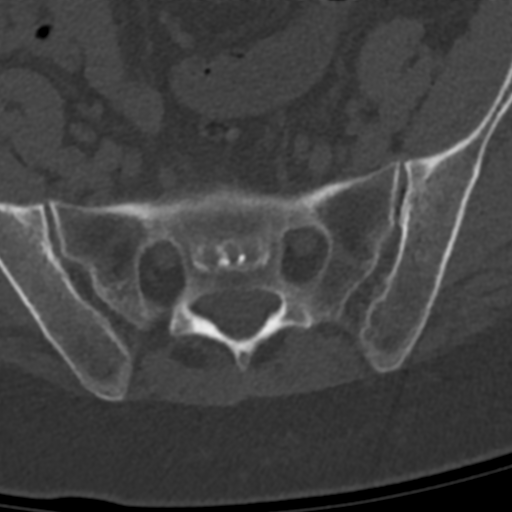
[im 44/114  bone]
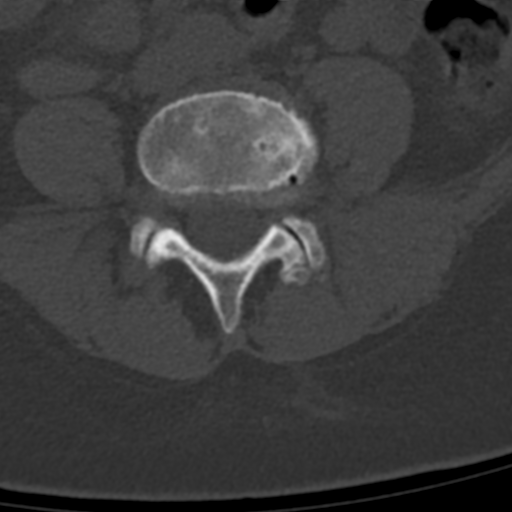
[im 70/114  bone]
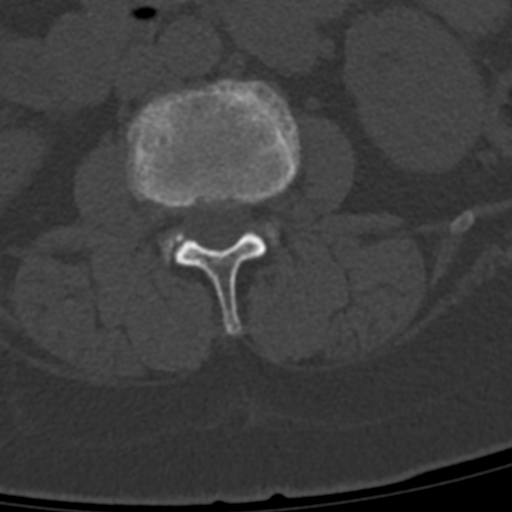
[im 96/114  bone]
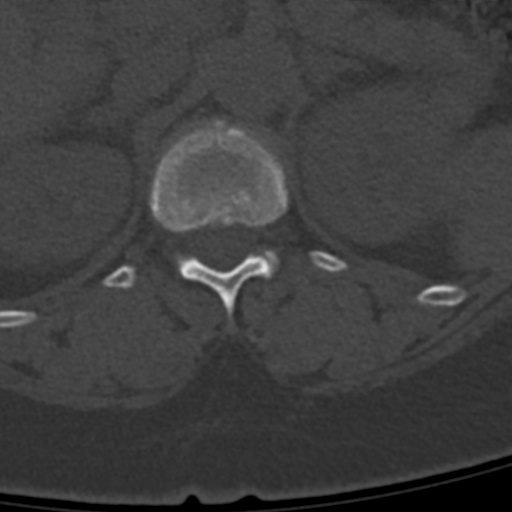

[Series 5: sagittal bone · sagittal · 0.32mm/px · 5 of 81 slices shown, 6 images]
[im 27/81  bone]
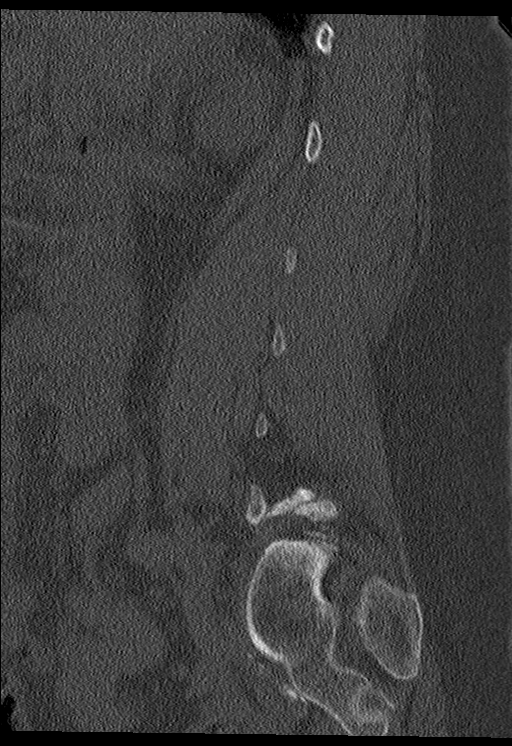
[im 34/81  bone]
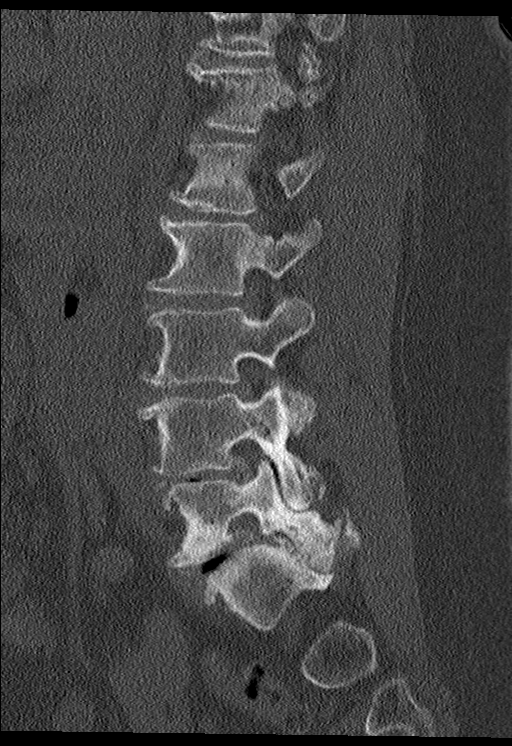
[im 41/81  soft-tissue]
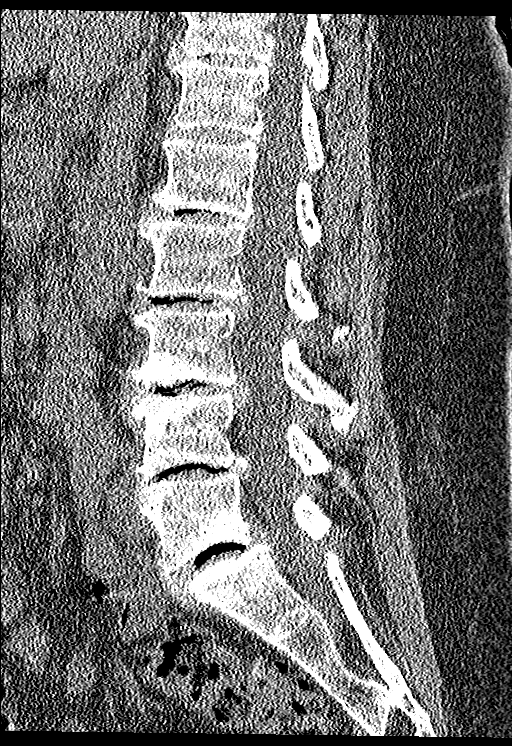
[im 41/81  bone]
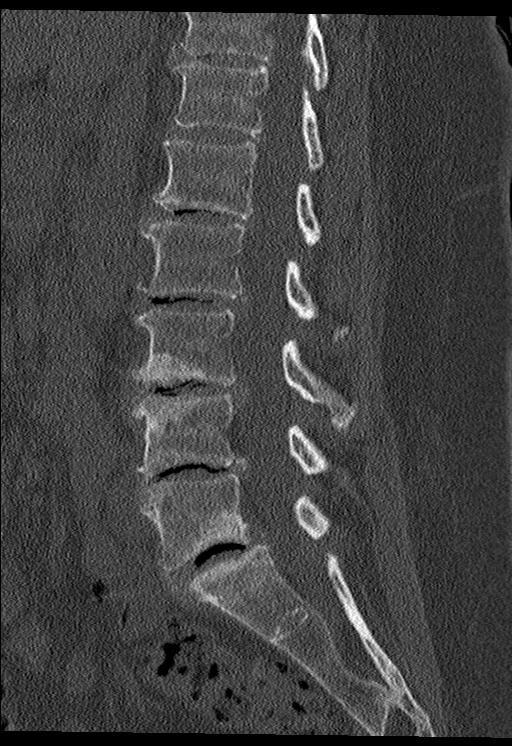
[im 47/81  bone]
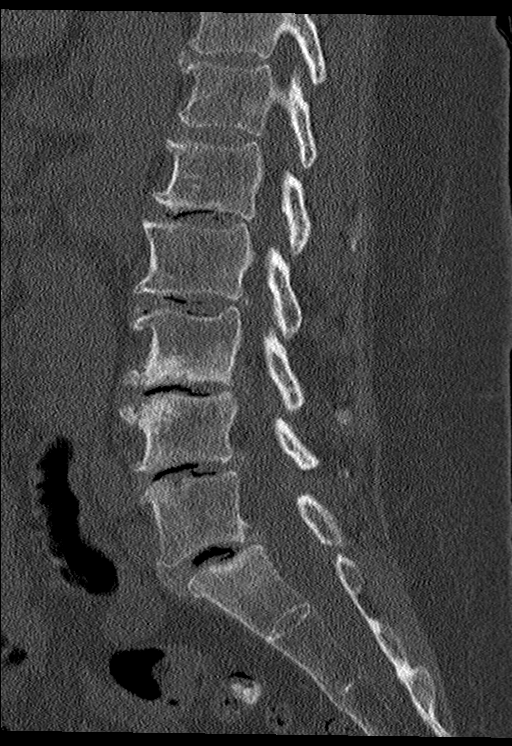
[im 54/81  bone]
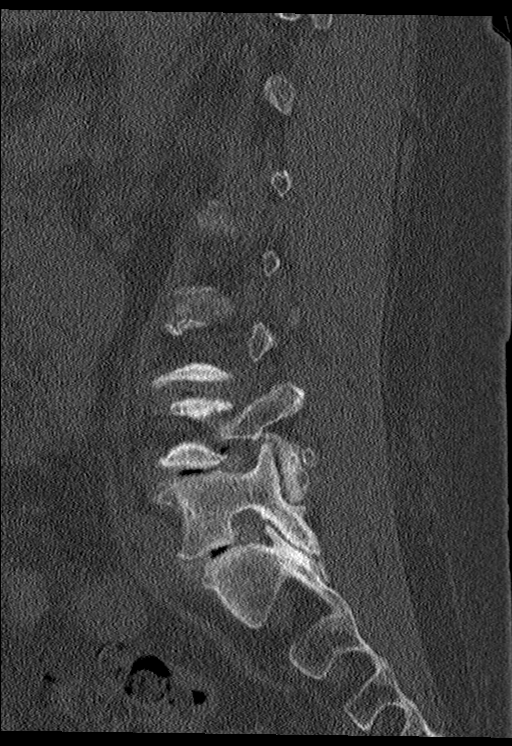

[Series 6: coronal bone · coronal · 0.31mm/px · 3 of 82 slices shown]
[im 17/82  bone]
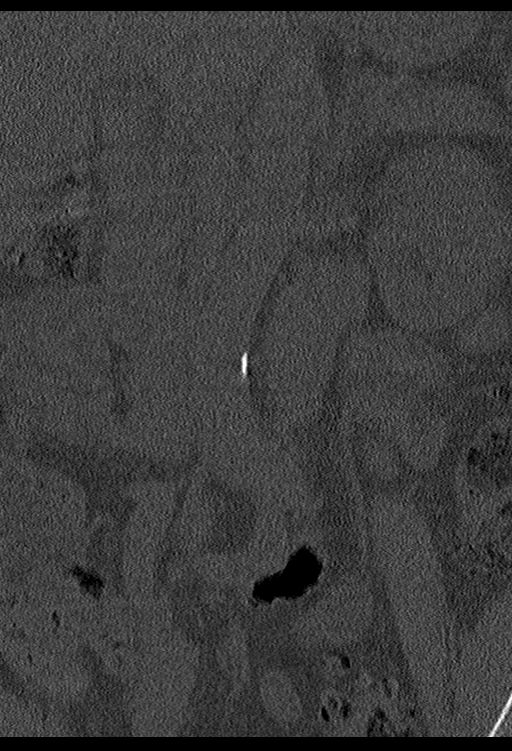
[im 33/82  bone]
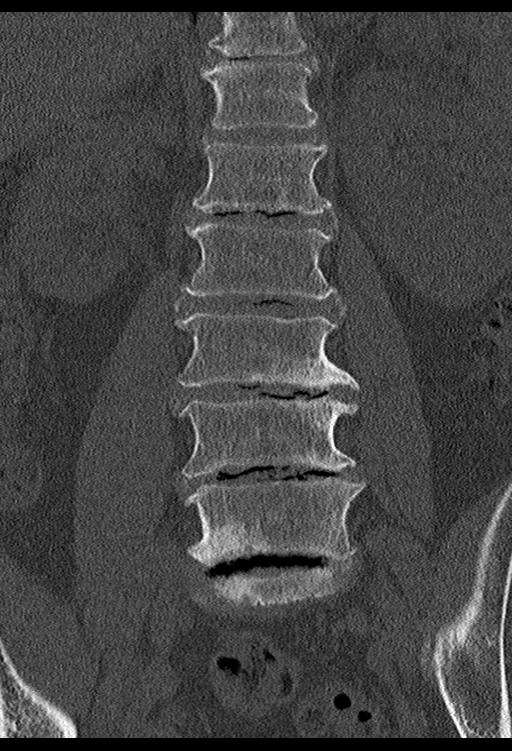
[im 49/82  bone]
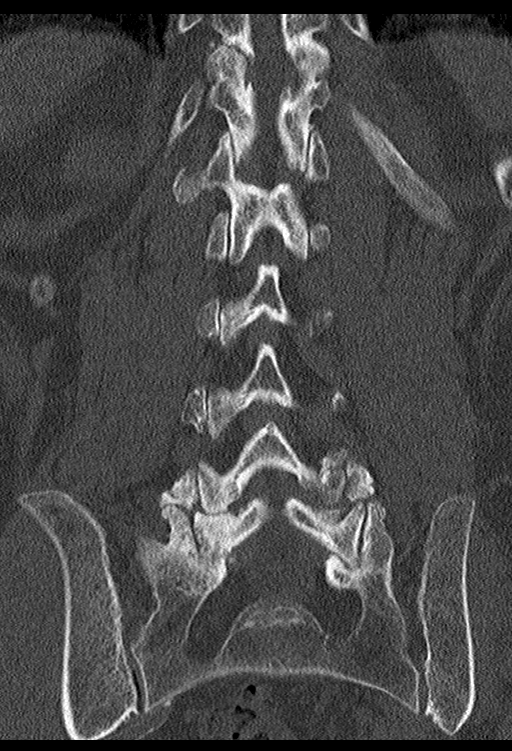

[12 of 33 positions shown; findings below may reference images not displayed]

FINDINGS: Segmentation: Standard.

Alignment: Facet degenerative disease results in 0.5 cm
anterolisthesis L5 on S1.

Vertebrae: No acute fracture or focal pathologic process.
Degenerative endplate sclerosis is most notable at L3-4.

Paraspinal and other soft tissues: Aortic atherosclerosis and
sigmoid diverticulosis are seen.

Disc levels: T11-12: Mild-to-moderate facet arthropathy and a
minimal disc bulge. No stenosis.

T12-L1: Shallow disc bulge and mild facet arthropathy.  No stenosis.

L1-2: Vacuum disc phenomenon, mild loss of disc space height and a
bulge eccentric to the left. The central canal and foramina are
open.

L2-3: Shallow disc bulge and vacuum disc phenomenon. Mild central
canal and bilateral foraminal narrowing.

L3-4: Vacuum disc phenomenon, disc bulge and ligamentum flavum
thickening. Mild facet degenerative disease. There is mild to
moderate central canal stenosis. Moderate to moderately severe
foraminal narrowing is worse on the left.

L4-5: Moderate facet arthropathy. Disc bulge and vacuum disc
phenomenon. The central canal is open. Moderate to moderately severe
foraminal narrowing is worse on the left.

L5-S1: Severe facet degenerative change on the right and
mild-to-moderate facet arthropathy on the left. The disc is
uncovered with a shallow bulge. The central canal appears open. Disc
and facet arthropathy cause severe right and moderately severe left
foraminal narrowing.
IMPRESSION: No acute abnormality.

Mild to moderate central canal stenosis at L3-4 where moderate to
moderately severe bilateral foraminal narrowing is worse on the
left.

Moderate to moderately severe bilateral foraminal narrowing at L4-5
is worse on the left. The central canal is open.

Severe right and moderately severe left foraminal narrowing at L5-S1
due to disc and right much worse than left facet degenerative
disease.

Diverticulosis.

Aortic Atherosclerosis ([id number]).

## 2021-09-26 ENCOUNTER — Telehealth: Payer: Self-pay | Admitting: Student in an Organized Health Care Education/Training Program

## 2021-09-26 NOTE — Telephone Encounter (Signed)
Called [facility] and they do have the Rx and have just received a truck in today of hydrocodone - apap 5-325 mg. Called patient to let her know.  ?

## 2021-10-03 ENCOUNTER — Encounter: Payer: 59 | Admitting: Student in an Organized Health Care Education/Training Program

## 2021-10-07 ENCOUNTER — Telehealth: Payer: Self-pay

## 2021-10-07 NOTE — Telephone Encounter (Signed)
[employer] monitor has not connected within 21 days. Attempted to call patient to advise. No answer, LMTCB. ? ? ?

## 2021-10-08 ENCOUNTER — Encounter: Payer: 59 | Admitting: Student in an Organized Health Care Education/Training Program

## 2021-10-11 NOTE — Telephone Encounter (Signed)
LMOVM for patient to check her monitor and to give [place] a call. ?

## 2021-10-16 ENCOUNTER — Other Ambulatory Visit: Payer: Self-pay | Admitting: Otolaryngology

## 2021-10-16 DIAGNOSIS — R42 Dizziness and giddiness: Secondary | ICD-10-CM

## 2021-10-16 NOTE — Telephone Encounter (Signed)
I spoke with the patient. She has recently moved to [place], [place] and needs a new power cord for her monitor. I called [employer] with the pt and they ordered her a new power cord. ?

## 2021-10-22 ENCOUNTER — Encounter: Payer: Medicaid Other | Admitting: Student in an Organized Health Care Education/Training Program

## 2021-10-25 ENCOUNTER — Emergency Department
Admission: EM | Admit: 2021-10-25 | Discharge: 2021-10-25 | Disposition: A | Discharge status: Home / Self Care | Payer: 59 | Attending: Emergency Medicine | Admitting: Emergency Medicine

## 2021-10-25 ENCOUNTER — Other Ambulatory Visit: Payer: Self-pay

## 2021-10-25 ENCOUNTER — Emergency Department: Payer: 59

## 2021-10-25 DIAGNOSIS — M1611 Unilateral primary osteoarthritis, right hip: Secondary | ICD-10-CM | POA: Diagnosis not present

## 2021-10-25 DIAGNOSIS — M25551 Pain in right hip: Secondary | ICD-10-CM

## 2021-10-25 DIAGNOSIS — I10 Essential (primary) hypertension: Secondary | ICD-10-CM | POA: Diagnosis not present

## 2021-10-25 DIAGNOSIS — Z79899 Other long term (current) drug therapy: Secondary | ICD-10-CM | POA: Diagnosis not present

## 2021-10-25 MED ORDER — PREDNISONE 20 MG PO TABS
60.0000 mg | ORAL_TABLET | Freq: Once | ORAL | Status: AC
  Administered 2021-10-25: 60 mg via ORAL
  Filled 2021-10-25: qty 3

## 2021-10-25 MED ORDER — HYDROCODONE-ACETAMINOPHEN 10-325 MG PO TABS: 1.0000 | ORAL_TABLET | Freq: Once | ORAL | Status: DC

## 2021-10-25 MED ORDER — HYDROCODONE-ACETAMINOPHEN 5-325 MG PO TABS
2.0000 | ORAL_TABLET | ORAL | Status: AC
  Administered 2021-10-25: 2 via ORAL
  Filled 2021-10-25: qty 2

## 2021-10-25 MED ORDER — HYDROCODONE-ACETAMINOPHEN 7.5-325 MG PO TABS: 1.0000 | ORAL_TABLET | Freq: Four times a day (QID) | ORAL | 0 refills | Status: DC | PRN

## 2021-10-25 MED ORDER — PREDNISONE 10 MG PO TABS: 10.0000 mg | ORAL_TABLET | Freq: Every day | ORAL | 0 refills | Status: DC

## 2021-10-25 NOTE — ED Triage Notes (Signed)
Pt to ED for right leg pain for 2 days. Denies swelling or redness. Denies fall. Reports hx sciatic pain ? ?

## 2021-10-25 NOTE — ED Provider Notes (Signed)
[facility]ER[facility]ENC[facility] ?Provider Note ? ? ?CSN: [id number] ?Arrival date & time: 10/25/21  1448 ? ?  ? ?History ? ?Chief Complaint  ?Patient presents with  ? Leg Pain  ? ? ?Kari Hughes is a 71 y.o. female with past medical history of fibromyalgia, chronic back pain, lumbar radiculopathy, hypertension, bipolar, major depressive disorder presents to the emergency department for 2-day onset of right hip pain.  She points to the lateral aspect of the right hip and has pain located in her groin or thigh.  She denies any trauma or injury.  States she has arthritis throughout her body.  She has been taking Norco 5-3 25 twice daily with no relief, states that this medication has not been helping but she is continue to take it even more frequent than what it was originally prescribed.  She denies any numbness tingling or radicular symptoms.  No abdominal pain, weakness, loss of bowel or bladder symptoms.  No fevers ? ?HPI ? ?  ? ?Home Medications ?Prior to Admission medications   ?Medication Sig Start Date End Date Taking? Authorizing Provider  ?HYDROcodone-acetaminophen (NORCO) 7.5-325 MG tablet Take 1 tablet by mouth every 6 (six) hours as needed for moderate pain. 10/25/21  Yes Evon SlackGaines, Treyven Lafauci C, PA-C  ?predniSONE (DELTASONE) 10 MG tablet Take 1 tablet (10 mg total) by mouth daily. 6,5,4,3,2,1 six day taper 10/25/21  Yes Evon SlackGaines, Raizy Auzenne C, PA-C  ?acetaminophen (TYLENOL) 500 MG tablet Take 1,000 mg by mouth every 6 (six) hours as needed.    [provider]  ?amLODipine (NORVASC) 10 MG tablet Take 1 tablet (10 mg total) by mouth daily. 10/03/20 07/30/21  Lanier PrudeLambert, Cameron T, MD  ?BRILINTA 60 MG TABS tablet Take 60 mg by mouth 2 (two) times daily. 01/15/21   [provider]  ?cyclobenzaprine (FLEXERIL) 10 MG tablet Take 1 tablet (10 mg total) by mouth 3 (three) times daily as needed for muscle spasms. 02/28/21   Edward JollyLateef, Bilal, MD  ?ezetimibe (ZETIA) 10 MG tablet Take 1 tablet (10 mg  total) by mouth daily. 05/16/20   Flinchum, Eula FriedMichelle S, FNP  ?hydrOXYzine (ATARAX/VISTARIL) 10 MG tablet TAKE 1 TABLET BY MOUTH 3 (THREE) TIMES DAILY AS NEEDED FOR ANXIETY. FOR SEVERE ANXIETY ATTACKS ONLY 03/13/21   Jomarie LongsEappen, Saramma, MD  ?meloxicam (MOBIC) 15 MG tablet Take 1 tablet by mouth daily as needed. 11/20/20   [provider]  ?pregabalin (LYRICA) 25 MG capsule Take 1-2 capsules (25-50 mg total) by mouth at bedtime. 08/06/21 05/03/22  Edward JollyLateef, Bilal, MD  ?sertraline (ZOLOFT) 100 MG tablet Take 1 tablet (100 mg total) by mouth daily with breakfast. 02/18/21   Jomarie LongsEappen, Saramma, MD  ?traZODone (DESYREL) 100 MG tablet TAKE 1 TABLET BY MOUTH EVERYDAY AT BEDTIME 03/13/21   Jomarie LongsEappen, Saramma, MD  ?   ? ?Allergies    ?Patient has no known allergies.   ? ?Review of Systems   ?Review of Systems ? ?Physical Exam ?Updated Vital Signs ?BP (!) 170/94 (BP Location: Left Arm)   Pulse 91   Temp 98.4 ?F (36.9 ?C) (Oral)   Resp 18   Ht 5\' 2"  (1.575 m)   Wt 72.6 kg   SpO2 95%   BMI 29.26 kg/m?  ?Physical Exam ?Constitutional:   ?   General: She is not in acute distress. ?   Appearance: Normal appearance. She is well-developed.  ?   Comments: Patient ambulatory with a walker  ?HENT:  ?   Head: Normocephalic and atraumatic.  ?Eyes:  ?  Conjunctiva/sclera: Conjunctivae normal.  ?Cardiovascular:  ?   Rate and Rhythm: Normal rate.  ?Pulmonary:  ?   Effort: Pulmonary effort is normal. No respiratory distress.  ?Abdominal:  ?   General: There is no distension.  ?   Tenderness: There is no abdominal tenderness. There is no guarding.  ?Musculoskeletal:     ?   General: Normal range of motion.  ?   Cervical back: Normal range of motion.  ?   Comments: Bilateral lower extremities show patient has good hip range of motion bilaterally, slight stiffness in the right hip with internal rotation when compared to the left.  Mild pain with right hip internal rotation reproducing groin and lateral hip pain.  She is minimally tender along the  greater trochanteric bursa.  Sensation is intact distally.  She has good knee flexion, extension, ankle plantarflexion dorsiflexion EHL.  She has normal patellar reflexes with no clonus noted.  No swelling throughout the lower extremities.  No warmth erythema or edema.  Negative Homans' sign bilaterally.  ?Skin: ?   General: Skin is warm.  ?   Findings: No rash.  ?Neurological:  ?   Mental Status: She is alert and oriented to person, place, and time.  ?Psychiatric:     ?   Behavior: Behavior normal.     ?   Thought Content: Thought content normal.  ? ? ?ED Results / Procedures / Treatments   ?Labs ?(all labs ordered are listed, but only abnormal results are displayed) ?Labs Reviewed - No data to display ? ?EKG ?None ? ?Radiology ?DG HIP UNILAT WITH PELVIS 2-3 VIEWS RIGHT ? ?Result Date: 10/25/2021 ?CLINICAL DATA:  Right hip pain. EXAM: DG HIP (WITH OR WITHOUT PELVIS) 2-3V RIGHT COMPARISON:  None. FINDINGS: No acute fracture or dislocation. No aggressive osseous lesion. Normal alignment. Generalized osteopenia. Degenerative disease with disc height loss at L4-5 and L5-S1. Soft tissue are unremarkable. No radiopaque foreign body or soft tissue emphysema. IMPRESSION: 1. No acute osseous injury of the right hip. Given the patient's age and osteopenia, if there is persistent clinical concern for an occult hip fracture, a MRI of the hip is recommended for increased sensitivity. Electronically Signed   By: Elige Ko M.D.   On: 10/25/2021 15:49   ? ?Procedures ?Procedures  ? ? ?Medications Ordered in ED ?Medications  ?HYDROcodone-acetaminophen (NORCO) 10-325 MG per tablet 1 tablet (has no administration in time range)  ?predniSONE (DELTASONE) tablet 60 mg (has no administration in time range)  ? ? ?ED Course/ Medical Decision Making/ A&P ?  ?                        ?Medical Decision Making ?Amount and/or Complexity of Data Reviewed ?Radiology: ordered. ? ? ?71 year old female with acute right lateral hip pain and groin  pain.  She has a history of osteoarthritis, x-rays obtained today show no fracture but moderate arthritic changes present.  Patient has good hip internal and external rotation with only slight stiffness but hip internal rotation reproduces her lateral hip pain as well as her groin pain.  She has no numbness tingling radicular symptoms.  No neurological deficits.  Patient ran out of her Norco 5-3 25 prescribed by pain physician.  We will give her a small amount of Norco 7.5 to take for breakthrough pain as well as a 6-day steroid taper.  Patient will follow-up with PCP, orthopedist, pain clinic, she understands signs and symptoms return to the ER for ?  Final Clinical Impression(s) / ED Diagnoses ?Final diagnoses:  ?Primary osteoarthritis of right hip  ?Acute right hip pain  ? ? ?Rx / DC Orders ?ED Discharge Orders   ? ?      Ordered  ?  HYDROcodone-acetaminophen (NORCO) 7.5-325 MG tablet  Every 6 hours PRN       ? 10/25/21 1636  ?  predniSONE (DELTASONE) 10 MG tablet  Daily       ? 10/25/21 1636  ? ?  ?  ? ?  ? ? ?  ?Evon Slack, PA-C ?10/25/21 1638 ? ?  ?Chesley Noon, MD ?10/29/21 1610 ? ?

## 2021-10-25 NOTE — Discharge Instructions (Signed)
Please continue with walker, take prednisone as prescribed.  Use Norco 7.5 every 6 hours as needed for severe pain.  Call PCP or pain management doctor to schedule follow-up appointment.  Return to the ER for any worsening symptoms or urgent changes in your health. ?

## 2021-10-29 ENCOUNTER — Encounter: Payer: 59 | Admitting: Student in an Organized Health Care Education/Training Program

## 2021-11-04 ENCOUNTER — Other Ambulatory Visit: Payer: Self-pay | Admitting: Otolaryngology

## 2021-11-04 DIAGNOSIS — R42 Dizziness and giddiness: Secondary | ICD-10-CM

## 2021-11-07 ENCOUNTER — Other Ambulatory Visit: Payer: 59

## 2021-11-07 ENCOUNTER — Encounter: Payer: Self-pay | Admitting: Student in an Organized Health Care Education/Training Program

## 2021-11-07 ENCOUNTER — Ambulatory Visit
Payer: 59 | Attending: Student in an Organized Health Care Education/Training Program | Admitting: Student in an Organized Health Care Education/Training Program

## 2021-11-07 VITALS — BP 157/112 | HR 82 | Temp 97.1°F | Resp 18 | Ht 62.0 in | Wt 151.0 lb

## 2021-11-07 DIAGNOSIS — M5441 Lumbago with sciatica, right side: Secondary | ICD-10-CM | POA: Insufficient documentation

## 2021-11-07 DIAGNOSIS — M5442 Lumbago with sciatica, left side: Secondary | ICD-10-CM | POA: Insufficient documentation

## 2021-11-07 DIAGNOSIS — G8929 Other chronic pain: Secondary | ICD-10-CM | POA: Diagnosis present

## 2021-11-07 DIAGNOSIS — M5416 Radiculopathy, lumbar region: Secondary | ICD-10-CM

## 2021-11-07 DIAGNOSIS — R110 Nausea: Secondary | ICD-10-CM | POA: Diagnosis not present

## 2021-11-07 DIAGNOSIS — G894 Chronic pain syndrome: Secondary | ICD-10-CM

## 2021-11-07 DIAGNOSIS — M47816 Spondylosis without myelopathy or radiculopathy, lumbar region: Secondary | ICD-10-CM

## 2021-11-07 DIAGNOSIS — M48062 Spinal stenosis, lumbar region with neurogenic claudication: Secondary | ICD-10-CM

## 2021-11-07 MED ORDER — HYDROCODONE-ACETAMINOPHEN 5-325 MG PO TABS: 1.0000 | ORAL_TABLET | Freq: Two times a day (BID) | ORAL | 0 refills | Status: AC | PRN

## 2021-11-07 MED ORDER — HYDROCODONE-ACETAMINOPHEN 5-325 MG PO TABS: 1.0000 | ORAL_TABLET | Freq: Two times a day (BID) | ORAL | 0 refills | Status: DC | PRN

## 2021-11-07 NOTE — Progress Notes (Signed)
Nursing Pain Medication Assessment:  ?Safety precautions to be maintained throughout the outpatient stay will include: orient to surroundings, keep bed in low position, maintain call bell within reach at all times, provide assistance with transfer out of bed and ambulation.  ?Medication Inspection Compliance: Kari Hughes did not comply with our request to bring her pills to be counted. She was reminded that bringing the medication bottles, even when empty, is a requirement. ? ?Medication: None brought in. ?Pill/Patch Count: None available to be counted. ?Bottle Appearance: No container available. Did not bring bottle(s) to appointment. ?Filled Date: N/A ?Last Medication intake:  Yesterday ?

## 2021-11-07 NOTE — Patient Instructions (Signed)
Fill dates ?4/27, 5/27, 6/26 ?

## 2021-11-07 NOTE — Progress Notes (Signed)
PROVIDER NOTE: Information contained herein reflects review and annotations entered in association with encounter. Interpretation of such information and data should be left to medically-trained personnel. Information provided to patient can be located elsewhere in the medical record under "Patient Instructions". Document created using STT-dictation technology, any transcriptional errors that may result from process are unintentional.  ?  ?Patient: Kari Hughes  Service Category: E/M  Provider: Gillis Santa, MD  ?DOB: [birth year]  DOS: 11/07/2021  Specialty: Interventional Pain Management  ?MRN: [id number]  Setting: Ambulatory outpatient  PCP: Gladstone Lighter, MD  ?Type: Established Patient    Referring Provider: Gladstone Lighter, MD  ?Location: Office  Delivery: Face-to-face    ? ?HPI  ?Kari Hughes, a 71 y.o. year old female, is here today because of her Chronic radicular lumbar pain [M54.16, G89.29]. Ms. Kobus's primary complain today is Back Pain (lower) ? ?Last encounter: My last encounter with her was on 07/30/2021 ?Pertinent problems: Ms. Frayne has Fibromyalgia; Chronic pain syndrome; Bipolar disorder ([place]); Nerve pain; Lumbar degenerative disc disease; Lumbar facet arthropathy; and Spinal stenosis, lumbar region, with neurogenic claudication on their pertinent problem list. ?Pain Assessment: Severity of Chronic pain is reported as a 10-Worst pain ever/10. Location: Back Low back/both legs. Onset: Greater than 3 months ago. Quality: Aching, throbbing. Timing: Worse when standing up and exertion. Modifying factor(s): walking, medications.  Improved with medications, rest, bathing ?Vitals:  height is 5' 2" (1.575 m) and weight is 151 lb (68.5 kg). Her temporal temperature is 97.1 ?F (36.2 ?C) (abnormal). Her blood pressure is 157/112 (abnormal) and her pulse is 82. Her respiration is 18 and oxygen saturation is 100%.  ? ?Reason for encounter: medication management.   ? ?Patient states that she has  been under a lot of stress recently.  She just finished a move.  She is having increased lower back pain with radiation into bilateral legs. ?She is having increased lumbar spine pain with radicular component.  She is status post lumbar epidural steroid injection on 03/27/2021.  She is requesting a repeat.  Risk and benefits reviewed and we will get her scheduled for bilateral transforaminal epidural steroid injection. ? ? ? ?Pharmacotherapy Assessment  ?Analgesic: Hydrocodone 5 mg twice daily as needed, quantity 60/month; MME equals 10   ? ?Monitoring: ?[place] PMP: PDMP reviewed during this encounter.       ?Pharmacotherapy: No side-effects or adverse reactions reported. ?Compliance: No problems identified. ?Effectiveness: Clinically acceptable.  UDS:  ?Summary  ?Date Value Ref Range Status  ?10/09/2020 Note  Final  ?  Comment:  ?  ==================================================================== ?Compliance Drug Analysis, Ur ?==================================================================== ?Test                             Result       Flag       Units ? ?Drug Present and Declared for Prescription Verification ?  Tramadol                       >3425        EXPECTED   ng/mg creat ?  O-Desmethyltramadol            2040         EXPECTED   ng/mg creat ?  N-Desmethyltramadol            1410         EXPECTED   ng/mg creat ?   Source of tramadol is a prescription  medication. O-desmethyltramadol ?   and N-desmethyltramadol are expected metabolites of tramadol. ? ?  Gabapentin                     PRESENT      EXPECTED ?  Methocarbamol                  PRESENT      EXPECTED ?  Guaifenesin                    PRESENT      EXPECTED ?   Guaifenesin may be administered as an over-the-counter or ?   prescription drug; it may also be present as a breakdown product of ?   methocarbamol. ? ?  Diphenhydramine                PRESENT      EXPECTED ? ?Drug Present not Declared for Prescription Verification ?  Acetaminophen                   PRESENT      UNEXPECTED ? ?Drug Absent but Declared for Prescription Verification ?  Duloxetine                     Not Detected UNEXPECTED ?==================================================================== ?Test                      Result    Flag   Units      Ref Range ?  Creatinine              146              mg/dL      >=20 ?==================================================================== ?Declared Medications: ? The flagging and interpretation on this report are based on the ? following declared medications.  Unexpected results may arise from ? inaccuracies in the declared medications. ? ? **Note: The testing scope of this panel includes these medications: ? ? Diphenhydramine (Benadryl) ? Duloxetine (Cymbalta) ? Gabapentin (Neurontin) ? Methocarbamol (Robaxin) ? Tramadol (Ultram) ? ? **Note: The testing scope of this panel does not include the ? following reported medications: ? ? Amlodipine (Norvasc) ? Ezetimibe (Zetia) ? Loperamide (Imodium) ? Ticagrelor (Brilinta) ?==================================================================== ?For clinical consultation, please call [phone]. ?==================================================================== ?  ?  ? ? ?ROS  ?Constitutional: Denies any fever or chills ?Gastrointestinal: No reported hemesis, hematochezia, vomiting, or acute GI distress ?Musculoskeletal:  Low back, bilateral hip, bilateral leg pain ?Neurological: No reported episodes of acute onset apraxia, aphasia, dysarthria, agnosia, amnesia, paralysis, loss of coordination, or loss of consciousness ? ?Medication Review  ?HYDROcodone-acetaminophen, acetaminophen, amLODipine, cyclobenzaprine, ezetimibe, hydrOXYzine, meloxicam, pregabalin, sertraline, ticagrelor, and traZODone ? ?History Review  ?Allergy: Ms. Jaggi has No Known Allergies. ?Drug: Ms. Klemz  reports that she does not currently use drugs after having used the following drugs: Marijuana. ?Alcohol:  reports current  alcohol use of about 1.0 standard drink per week. ?Tobacco:  reports that she has been smoking cigarettes. She has never used smokeless tobacco. ?Social: Ms. Janczak  reports that she has been smoking cigarettes. She has never used smokeless tobacco. She reports current alcohol use of about 1.0 standard drink per week. She reports that she does not currently use drugs after having used the following drugs: Marijuana. ?Medical:  has a past medical history of Asthma, Depression, Hypertension, Hypertension (2002), Spinal stenosis (2012), and Vertigo. ?Surgical: Ms. Serano  has a past surgical history that includes   pacemaker placement (2019) and Joint replacement (Left, 2013). ?Family: family history includes Alcohol abuse in her brother and brother; Drug abuse in her brother and brother. ? ?Laboratory Chemistry Profile  ? ?Renal ?Lab Results  ?Component Value Date  ? BUN 15 04/02/2021  ? CREATININE 0.86 04/02/2021  ? BCR 21 05/08/2020  ? GFRAA 73 05/08/2020  ? GFRNONAA >60 04/02/2021  ? ?  Hepatic ?Lab Results  ?Component Value Date  ? AST 23 07/01/2020  ? ALT 20 07/01/2020  ? ALBUMIN 3.8 07/01/2020  ? ALKPHOS 56 07/01/2020  ? ?  ?Electrolytes ?Lab Results  ?Component Value Date  ? NA 136 04/02/2021  ? K 4.0 04/02/2021  ? CL 103 04/02/2021  ? CALCIUM 9.0 04/02/2021  ? ?  Bone ?No results found for: VD25OH, VD125OH2TOT, VD3125OH2, VD2125OH2, 25OHVITD1, 25OHVITD2, 25OHVITD3, TESTOFREE, TESTOSTERONE ?  ?Inflammation (CRP: Acute Phase) (ESR: Chronic Phase) ?No results found for: CRP, ESRSEDRATE, LATICACIDVEN ?    ?Note: Above Lab results reviewed. ? ? ?Physical Exam  ?General appearance: Well nourished, well developed, and well hydrated. In no apparent acute distress ?Mental status: Alert, oriented x 3 (person, place, & time)       ?Respiratory: No evidence of acute respiratory distress ?Eyes: PERLA ?Vitals: BP (!) 157/112   Pulse 82   Temp (!) 97.1 ?F (36.2 ?C) (Temporal)   Resp 18   Ht 5' 2" (1.575 m)   Wt 151 lb  (68.5 kg)   SpO2 100%   BMI 27.62 kg/m?  ?BMI: Estimated body mass index is 27.62 kg/m? as calculated from the following: ?  Height as of this encounter: 5' 2" (1.575 m). ?  Weight as of this encounter: 151 lb (

## 2021-11-11 ENCOUNTER — Other Ambulatory Visit: Payer: Self-pay | Admitting: *Deleted

## 2021-11-11 DIAGNOSIS — G894 Chronic pain syndrome: Secondary | ICD-10-CM

## 2021-11-11 MED ORDER — HYDROCODONE-ACETAMINOPHEN 5-325 MG PO TABS: 1.0000 | ORAL_TABLET | Freq: Two times a day (BID) | ORAL | 0 refills | Status: AC | PRN

## 2021-11-13 ENCOUNTER — Other Ambulatory Visit: Payer: 59

## 2021-11-15 ENCOUNTER — Other Ambulatory Visit: Payer: 59

## 2021-11-16 ENCOUNTER — Emergency Department: Payer: 59

## 2021-11-16 ENCOUNTER — Emergency Department
Admission: EM | Admit: 2021-11-16 | Discharge: 2021-11-16 | Disposition: A | Discharge status: Home / Self Care | Payer: 59 | Attending: Emergency Medicine | Admitting: Emergency Medicine

## 2021-11-16 ENCOUNTER — Other Ambulatory Visit: Payer: Self-pay

## 2021-11-16 DIAGNOSIS — M25551 Pain in right hip: Secondary | ICD-10-CM | POA: Insufficient documentation

## 2021-11-16 DIAGNOSIS — Z950 Presence of cardiac pacemaker: Secondary | ICD-10-CM | POA: Diagnosis not present

## 2021-11-16 DIAGNOSIS — J45909 Unspecified asthma, uncomplicated: Secondary | ICD-10-CM | POA: Diagnosis not present

## 2021-11-16 DIAGNOSIS — I10 Essential (primary) hypertension: Secondary | ICD-10-CM | POA: Diagnosis not present

## 2021-11-16 DIAGNOSIS — G8929 Other chronic pain: Secondary | ICD-10-CM | POA: Diagnosis not present

## 2021-11-16 MED ORDER — LIDOCAINE 5 % EX PTCH
1.0000 | MEDICATED_PATCH | CUTANEOUS | Status: DC
  Administered 2021-11-16: 1 via TRANSDERMAL
  Filled 2021-11-16: qty 1

## 2021-11-16 MED ORDER — LIDOCAINE 5 % EX PTCH: 1.0000 | MEDICATED_PATCH | Freq: Two times a day (BID) | CUTANEOUS | 0 refills | Status: DC

## 2021-11-16 MED ORDER — OXYCODONE-ACETAMINOPHEN 5-325 MG PO TABS
1.0000 | ORAL_TABLET | Freq: Once | ORAL | Status: AC
  Administered 2021-11-16: 1 via ORAL
  Filled 2021-11-16: qty 1

## 2021-11-16 MED ORDER — CYCLOBENZAPRINE HCL 5 MG PO TABS: 5.0000 mg | ORAL_TABLET | Freq: Three times a day (TID) | ORAL | 0 refills | Status: DC | PRN

## 2021-11-16 NOTE — ED Provider Notes (Signed)
? ?[facility] ?Provider Note ? ? ? Event Date/Time  ? First MD Initiated Contact with Patient 11/16/21 2015   ?  (approximate) ? ? ?History  ? ?Chief Complaint ?Hip Pain ? ? ?HPI ? ?Kari Hughes is a 71 y.o. female with past medical history of hypertension, hyperlipidemia, asthma, and chronic pain syndrome who presents to the ED complaining of hip pain.  Patient reports that she fell sometime in the middle of April when she tripped over an extension cord, striking her right hip.  She reports being seen in the ED at that time with x-rays that were unremarkable, however she continues to have pain in her right hip.  She states it is painful for her to walk or lift the leg up off of the bed.  She denies any pain in her knee or ankle, states she takes Vicodin chronically for pain but this has not been helping her.  She denies any falls since the one in the middle of April. ?  ? ? ?Physical Exam  ? ?Triage Vital Signs: ?ED Triage Vitals  ?Enc Vitals Group  ?   BP 11/16/21 2017 (!) 143/124  ?   Pulse Rate 11/16/21 2017 69  ?   Resp 11/16/21 2017 18  ?   Temp --   ?   Temp src --   ?   SpO2 11/16/21 2017 94 %  ?   Weight 11/16/21 2020 151 lb (68.5 kg)  ?   Height --   ?   Head Circumference --   ?   Peak Flow --   ?   Pain Score 11/16/21 2017 10  ?   Pain Loc --   ?   Pain Edu? --   ?   Excl. in GC? --   ? ? ?Most recent vital signs: ?Vitals:  ? 11/16/21 2017  ?BP: (!) 143/124  ?Pulse: 69  ?Resp: 18  ?Temp: 97.8 ?F (36.6 ?C)  ?SpO2: 94%  ? ? ?Constitutional: Alert and oriented. ?Eyes: Conjunctivae are normal. ?Head: Atraumatic. ?Nose: No congestion/rhinnorhea. ?Mouth/Throat: Mucous membranes are moist.  ?Neck: No midline cervical spine tenderness to palpation. ?Cardiovascular: Normal rate, regular rhythm. Grossly normal heart sounds.  2+ radial and DP pulses bilaterally. ?Respiratory: Normal respiratory effort.  No retractions. Lungs CTAB. ?Gastrointestinal: Soft and nontender. No  distention. ?Musculoskeletal: Diffuse tenderness to palpation of right hip with no obvious deformity.  No tenderness to palpation at left hip, bilateral knees, or bilateral ankles. ?Neurologic:  Normal speech and language. No gross focal neurologic deficits are appreciated. ? ? ? ?ED Results / Procedures / Treatments  ? ?Labs ?(all labs ordered are listed, but only abnormal results are displayed) ?Labs Reviewed - No data to display ? ?RADIOLOGY ?CT of right hip reviewed by me with no fracture or dislocation. ? ?PROCEDURES: ? ?Critical Care performed: No ? ?Procedures ? ? ?MEDICATIONS ORDERED IN ED: ?Medications  ?lidocaine (LIDODERM) 5 % 1 patch (1 patch Transdermal Patch Applied 11/16/21 2035)  ?oxyCODONE-acetaminophen (PERCOCET/ROXICET) 5-325 MG per tablet 1 tablet (1 tablet Oral Given 11/16/21 2032)  ? ? ? ?IMPRESSION / MDM / ASSESSMENT AND PLAN / ED COURSE  ?I reviewed the triage vital signs and the nursing notes. ?             ?               ? ?71 y.o. female with past medical history of hypertension, hyperlipidemia, asthma, and chronic pain syndrome who presents  to the ED for ongoing right hip pain after fall about 3 weeks ago, at which time she had negative x-rays. ? ?Differential diagnosis includes, but is not limited to, hip fracture, hip dislocation, hip contusion, chronic pain syndrome, lumbar radiculopathy. ? ?Patient well-appearing and in no acute distress, vital signs are unremarkable and she is neurovascularly intact to her distal right lower extremity.  I reviewed x-rays from her ED visit for fall on April 14, which showed no signs of fracture or dislocation but did show signs of osteoarthritis.  We will further assess with CT scan for occult fracture given patient has a pacemaker and cannot undergo MRI in this facility.  Overall suspicion for occult fracture is low given patient is able to range her right hip with minimal pain.  She describes pain radiating down the back of her left leg and symptoms  seem more concerning for lumbar radiculopathy. ? ?CT hip is negative for fracture or dislocation, does show signs of significant osteoarthritis that is likely the source of patient's symptoms as well as component of lumbar radiculopathy.  She is appropriate for discharge home with follow-up with her pain management provider, will be prescribed Lidoderm patches and Flexeril.  She was counseled to return to the ED for new worsening symptoms, patient agrees with plan. ? ?  ? ? ?FINAL CLINICAL IMPRESSION(S) / ED DIAGNOSES  ? ?Final diagnoses:  ?Chronic pain of right hip  ? ? ? ?Rx / DC Orders  ? ?ED Discharge Orders   ? ?      Ordered  ?  lidocaine (LIDODERM) 5 %  Every 12 hours       ? 11/16/21 2121  ?  cyclobenzaprine (FLEXERIL) 5 MG tablet  3 times daily PRN       ? 11/16/21 2121  ? ?  ?  ? ?  ? ? ? ?Note:  This document was prepared using Dragon voice recognition software and may include unintentional dictation errors. ?  ?Chesley Noon, MD ?11/16/21 2123 ? ?

## 2021-11-16 NOTE — ED Triage Notes (Signed)
PT fell  end of April was seen here, imaging negative. PMH vertigo. Pt endorsing increased hip pain and difficulty walking. 133/78 BP enroute, 73BPM, NAD ?

## 2021-11-19 ENCOUNTER — Ambulatory Visit
Admission: RE | Admit: 2021-11-19 | Discharge: 2021-11-19 | Disposition: A | Discharge status: Home / Self Care | Payer: 59 | Source: Ambulatory Visit | Attending: Otolaryngology | Admitting: Otolaryngology

## 2021-11-19 ENCOUNTER — Other Ambulatory Visit: Payer: 59

## 2021-11-19 DIAGNOSIS — R42 Dizziness and giddiness: Secondary | ICD-10-CM

## 2021-11-20 ENCOUNTER — Ambulatory Visit: Admission: RE | Admit: 2021-11-20 | Payer: 59 | Source: Ambulatory Visit

## 2021-11-20 ENCOUNTER — Ambulatory Visit
Payer: 59 | Attending: Student in an Organized Health Care Education/Training Program | Admitting: Student in an Organized Health Care Education/Training Program

## 2021-11-20 ENCOUNTER — Encounter: Payer: Self-pay | Admitting: Student in an Organized Health Care Education/Training Program

## 2021-11-20 VITALS — BP 130/76 | HR 69 | Temp 97.3°F | Resp 16 | Ht 62.0 in | Wt 151.0 lb

## 2021-11-20 DIAGNOSIS — M5416 Radiculopathy, lumbar region: Secondary | ICD-10-CM

## 2021-11-20 DIAGNOSIS — G8929 Other chronic pain: Secondary | ICD-10-CM

## 2021-11-20 MED ORDER — ROPIVACAINE HCL 2 MG/ML IJ SOLN
2.0000 mL | Freq: Once | INTRAMUSCULAR | Status: DC
  Filled 2021-11-20: qty 20

## 2021-11-20 MED ORDER — IOHEXOL 180 MG/ML  SOLN
10.0000 mL | Freq: Once | INTRAMUSCULAR | Status: DC
  Filled 2021-11-20: qty 20

## 2021-11-20 MED ORDER — DEXAMETHASONE SODIUM PHOSPHATE 10 MG/ML IJ SOLN
20.0000 mg | Freq: Once | INTRAMUSCULAR | Status: DC
  Filled 2021-11-20: qty 2

## 2021-11-20 MED ORDER — SODIUM CHLORIDE 0.9% FLUSH: 2.0000 mL | Freq: Once | INTRAVENOUS | Status: DC

## 2021-11-20 MED ORDER — LIDOCAINE HCL 2 % IJ SOLN
20.0000 mL | Freq: Once | INTRAMUSCULAR | Status: DC
  Filled 2021-11-20: qty 20

## 2021-11-20 NOTE — Progress Notes (Signed)
Safety precautions to be maintained throughout the outpatient stay will include: orient to surroundings, keep bed in low position, maintain call bell within reach at all times, provide assistance with transfer out of bed and ambulation.  

## 2021-11-20 NOTE — Progress Notes (Signed)
Patient does not have transportation to go home with so procedure was canceled. ?

## 2021-11-21 ENCOUNTER — Telehealth: Payer: Self-pay

## 2021-11-21 NOTE — Telephone Encounter (Signed)
Didn't have procedure ?

## 2021-11-21 NOTE — Telephone Encounter (Signed)
She called to tell Dr. Cherylann Ratel she fell in March and her leg is in really bad shape. I asked her if she told him that in her appt yesterday and she said she thinks she did. She said her medicine is not really working.  ?

## 2021-11-21 NOTE — Telephone Encounter (Signed)
Do you have any suggestions? I will be glad to call her. I am not sure what she discussed with you yesterday knowing that she was sleepy. ?

## 2021-11-25 NOTE — Telephone Encounter (Signed)
Called patient and nstructed her that she needs to schedule appt for her injections. Informed her that she would need a driver to and from to have this procedure. Patient with understanding and will call back to schedule. ?

## 2021-11-27 ENCOUNTER — Ambulatory Visit (HOSPITAL_BASED_OUTPATIENT_CLINIC_OR_DEPARTMENT_OTHER): Payer: 59 | Admitting: Student in an Organized Health Care Education/Training Program

## 2021-11-27 ENCOUNTER — Ambulatory Visit
Admission: RE | Admit: 2021-11-27 | Discharge: 2021-11-27 | Disposition: A | Discharge status: Home / Self Care | Payer: 59 | Source: Ambulatory Visit | Attending: Student in an Organized Health Care Education/Training Program | Admitting: Student in an Organized Health Care Education/Training Program

## 2021-11-27 ENCOUNTER — Encounter: Payer: Self-pay | Admitting: Student in an Organized Health Care Education/Training Program

## 2021-11-27 VITALS — BP 144/99 | HR 75 | Temp 97.5°F | Resp 20 | Ht 61.0 in | Wt 151.0 lb

## 2021-11-27 DIAGNOSIS — M5416 Radiculopathy, lumbar region: Secondary | ICD-10-CM | POA: Diagnosis present

## 2021-11-27 DIAGNOSIS — G8929 Other chronic pain: Secondary | ICD-10-CM | POA: Diagnosis present

## 2021-11-27 DIAGNOSIS — G894 Chronic pain syndrome: Secondary | ICD-10-CM | POA: Diagnosis present

## 2021-11-27 DIAGNOSIS — M5442 Lumbago with sciatica, left side: Secondary | ICD-10-CM | POA: Diagnosis present

## 2021-11-27 DIAGNOSIS — M5441 Lumbago with sciatica, right side: Secondary | ICD-10-CM

## 2021-11-27 MED ORDER — SODIUM CHLORIDE (PF) 0.9 % IJ SOLN
INTRAMUSCULAR | Status: AC
  Filled 2021-11-27: qty 10

## 2021-11-27 MED ORDER — IOHEXOL 180 MG/ML  SOLN
10.0000 mL | Freq: Once | INTRAMUSCULAR | Status: AC
  Administered 2021-11-27: 10 mL via EPIDURAL
  Filled 2021-11-27: qty 20

## 2021-11-27 MED ORDER — ROPIVACAINE HCL 2 MG/ML IJ SOLN
2.0000 mL | Freq: Once | INTRAMUSCULAR | Status: AC
  Administered 2021-11-27: 2 mL via EPIDURAL
  Filled 2021-11-27: qty 20

## 2021-11-27 MED ORDER — DEXAMETHASONE SODIUM PHOSPHATE 10 MG/ML IJ SOLN
20.0000 mg | Freq: Once | INTRAMUSCULAR | Status: AC
  Administered 2021-11-27: 20 mg
  Filled 2021-11-27: qty 2

## 2021-11-27 MED ORDER — LIDOCAINE HCL 2 % IJ SOLN
20.0000 mL | Freq: Once | INTRAMUSCULAR | Status: AC
  Administered 2021-11-27: 400 mg
  Filled 2021-11-27: qty 20

## 2021-11-27 MED ORDER — SODIUM CHLORIDE 0.9% FLUSH
2.0000 mL | Freq: Once | INTRAVENOUS | Status: AC
  Administered 2021-11-27: 2 mL

## 2021-11-27 NOTE — Patient Instructions (Signed)

## 2021-11-27 NOTE — Progress Notes (Signed)
PROVIDER NOTE: Interpretation of information contained herein should be left to medically-trained personnel. Specific patient instructions are provided elsewhere under "Patient Instructions" section of medical record. This document was created in part using STT-dictation technology, any transcriptional errors that may result from this process are unintentional.  ?Patient: Kari Hughes ?Type: Established ?DOB: August 29, 1950 ?MRN: [id number] ?PCP: Enid Baas, MD  Service: Procedure ?DOS: 11/27/2021 ?Setting: Ambulatory ?Location: Ambulatory outpatient facility ?Delivery: Face-to-face Provider: Edward Jolly, MD ?Specialty: Interventional Pain Management ?Specialty designation: 09 ?Location: Outpatient facility ?Ref. Prov.: Enid Baas, MD   ? ?Primary Reason for Visit: Interventional Pain Management Treatment. ?CC: Back Pain ?  ?Procedure:          ? Type: Trans-Foraminal Epidural Steroid Injection (Lumbar) #1  ?Laterality: Bilateral  ?Level: L5           ?Imaging: Fluoroscopic guidance ?Anesthesia: Local anesthesia (1-2% Lidocaine) ?DOS: 11/27/2021  ?Performed by: Edward Jolly, MD ? ?Purpose: Diagnostic/Therapeutic ?Indications: Lumbar radicular pain severe enough to impact quality of life or function. ?1. Chronic radicular lumbar pain   ?2. Lumbar radiculopathy   ?3. Chronic bilateral low back pain with bilateral sciatica   ?4. Chronic pain syndrome   ? ?NAS-11 Pain score:  ? Pre-procedure: 8 /10  ? Post-procedure: 0-No pain/10  ? ?  ?Position / Prep / Materials:  ?Position: Prone  ?Prep solution: DuraPrep (Iodine Povacrylex [0.7% available iodine] and Isopropyl Alcohol, 74% w/w) ?Prep Area: Entire Posterior Lumbosacral Area.  From the lower tip of the scapula down to the tailbone and from flank to flank. ?Materials:  ?Tray: Block ?Needle(s):  ?Type: Spinal  ?Gauge (G): 22  ?Length: 5-in  ?Qty: 2 ? ?Pre-op H&P Assessment:  ?Kari Hughes is a 71 y.o. (year old), female patient, seen today for interventional  treatment. She  has a past surgical history that includes pacemaker placement (2019) and Joint replacement (Left, 2013). Kari Hughes has a current medication list which includes the following prescription(s): acetaminophen, brilinta, cyclobenzaprine, ezetimibe, [START ON 12/07/2021] hydrocodone-acetaminophen, [START ON 01/06/2022] hydrocodone-acetaminophen, hydrocodone-acetaminophen, hydroxyzine, lidocaine, meloxicam, pregabalin, sertraline, trazodone, and amlodipine. Her primarily concern today is the Back Pain ? ?Initial Vital Signs:  ?Pulse/HCG Rate: 75ECG Heart Rate: 84 ?Temp:  ?(!) 97.5 ?F (36.4 ?C) ?Resp: 16 ?BP:  ?(!) 149/100 ?SpO2: 100 % ? ?BMI: Estimated body mass index is 28.53 kg/m? as calculated from the following: ?  Height as of this encounter: 5\' 1"  (1.549 m). ?  Weight as of this encounter: 151 lb (68.5 kg). ? ?Risk Assessment: ?Allergies: Reviewed. She has No Known Allergies.  ?Allergy Precautions: None required ?Coagulopathies: Reviewed. None identified.  ?Blood-thinner therapy: None at this time ?Active Infection(s): Reviewed. None identified. Kari Hughes is afebrile ? ?Site Confirmation: [name] was asked to confirm the procedure and laterality before marking the site ?Procedure checklist: Completed ?Consent: Before the procedure and under the influence of no sedative(s), amnesic(s), or anxiolytics, the patient was informed of the treatment options, risks and possible complications. To fulfill our ethical and legal obligations, as recommended by the American Medical Association's Code of Ethics, I have informed the patient of my clinical impression; the nature and purpose of the treatment or procedure; the risks, benefits, and possible complications of the intervention; the alternatives, including doing nothing; the risk(s) and benefit(s) of the alternative treatment(s) or procedure(s); and the risk(s) and benefit(s) of doing nothing. ?The patient was provided information about the general risks  and possible complications associated with the procedure. These may include, but are not limited to: failure  to achieve desired goals, infection, bleeding, organ or nerve damage, allergic reactions, paralysis, and death. ?In addition, the patient was informed of those risks and complications associated to Spine-related procedures, such as failure to decrease pain; infection (i.e.: Meningitis, epidural or intraspinal abscess); bleeding (i.e.: epidural hematoma, subarachnoid hemorrhage, or any other type of intraspinal or peri-dural bleeding); organ or nerve damage (i.e.: Any type of peripheral nerve, nerve root, or spinal cord injury) with subsequent damage to sensory, motor, and/or autonomic systems, resulting in permanent pain, numbness, and/or weakness of one or several areas of the body; allergic reactions; (i.e.: anaphylactic reaction); and/or death. ?Furthermore, the patient was informed of those risks and complications associated with the medications. These include, but are not limited to: allergic reactions (i.e.: anaphylactic or anaphylactoid reaction(s)); adrenal axis suppression; blood sugar elevation that in diabetics may result in ketoacidosis or comma; water retention that in patients with history of congestive heart failure may result in shortness of breath, pulmonary edema, and decompensation with resultant heart failure; weight gain; swelling or edema; medication-induced neural toxicity; particulate matter embolism and blood vessel occlusion with resultant organ, and/or nervous system infarction; and/or aseptic necrosis of one or more joints. ?Finally, the patient was informed that Medicine is not an exact science; therefore, there is also the possibility of unforeseen or unpredictable risks and/or possible complications that may result in a catastrophic outcome. The patient indicated having understood very clearly. We have given the patient no guarantees and we have made no promises. Enough time was  given to the patient to ask questions, all of which were answered to the patient's satisfaction. Kari Hughes has indicated that she wanted to continue with the procedure. ?Attestation: I, the ordering provider, attest that I have discussed with the patient the benefits, risks, side-effects, alternatives, likelihood of achieving goals, and potential problems during recovery for the procedure that I have provided informed consent. ?Date  Time: 11/27/2021 11:28 AM ? ?Pre-Procedure Preparation:  ?Monitoring: As per clinic protocol. Respiration, ETCO2, SpO2, BP, heart rate and rhythm monitor placed and checked for adequate function ?Safety Precautions: Patient was assessed for positional comfort and pressure points before starting the procedure. ?Time-out: I initiated and conducted the "Time-out" before starting the procedure, as per protocol. The patient was asked to participate by confirming the accuracy of the "Time Out" information. Verification of the correct person, site, and procedure were performed and confirmed by me, the nursing staff, and the patient. "Time-out" conducted as per Joint Commission's Universal Protocol (UP.01.01.01). ?Time: 1204 ? ?Description/Narrative of Procedure:          ?Target: The 6 o'clock position under the pedicle, on the affected side. ?Region: Posterolateral Lumbosacral ?Approach: Posterior Percutaneous Paravertebral approach. ? ?Rationale (medical necessity): procedure needed and proper for the diagnosis and/or treatment of the patient's medical symptoms and needs. ?Procedural Technique Safety Precautions: Aspiration looking for blood return was conducted prior to all injections. At no point did we inject any substances, as a needle was being advanced. No attempts were made at seeking any paresthesias. Safe injection practices and needle disposal techniques used. Medications properly checked for expiration dates. SDV (single dose vial) medications used. ?Description of the Procedure:  Protocol guidelines were followed. The patient was placed in position over the procedure table. The target area was identified and the area prepped in the usual manner. Skin & deeper tissues infiltrate

## 2021-11-27 NOTE — Progress Notes (Signed)
Safety precautions to be maintained throughout the outpatient stay will include: orient to surroundings, keep bed in low position, maintain call bell within reach at all times, provide assistance with transfer out of bed and ambulation.  

## 2021-11-28 ENCOUNTER — Telehealth: Payer: Self-pay | Admitting: *Deleted

## 2021-11-28 NOTE — Telephone Encounter (Signed)
Called for post procedure check. Doing well. No issues.

## 2021-12-03 ENCOUNTER — Telehealth: Payer: Self-pay | Admitting: Student in an Organized Health Care Education/Training Program

## 2021-12-03 NOTE — Telephone Encounter (Signed)
Pt stated that she was having joint pain. Pt would like some called into the Pharmacy for joint pain. Please call Pt with an update.

## 2021-12-03 NOTE — Telephone Encounter (Signed)
Please call and schedule for a virtual visit.

## 2021-12-03 NOTE — Telephone Encounter (Unsigned)
Pt stated that she was having joint pain. Pt would like something called into the Pharmacy for this pain. Please call Pt with an update.

## 2021-12-24 ENCOUNTER — Ambulatory Visit
Payer: 59 | Attending: Student in an Organized Health Care Education/Training Program | Admitting: Student in an Organized Health Care Education/Training Program

## 2021-12-24 DIAGNOSIS — G8929 Other chronic pain: Secondary | ICD-10-CM

## 2021-12-24 DIAGNOSIS — M5416 Radiculopathy, lumbar region: Secondary | ICD-10-CM

## 2021-12-24 DIAGNOSIS — M5442 Lumbago with sciatica, left side: Secondary | ICD-10-CM

## 2021-12-24 DIAGNOSIS — M5441 Lumbago with sciatica, right side: Secondary | ICD-10-CM

## 2021-12-24 NOTE — Progress Notes (Signed)
Patient: Kari Hughes  Service Category: E/M  Provider: Gillis Santa, MD  DOB: [birth year]  DOS: 12/24/2021  Location: Office  MRN: [id number]  Setting: Ambulatory outpatient  Referring Provider: Gladstone Lighter, MD  Type: Established Patient  Specialty: Interventional Pain Management  PCP: Gladstone Lighter, MD  Location: Remote location  Delivery: TeleHealth     Virtual Encounter - Pain Management PROVIDER NOTE: Information contained herein reflects review and annotations entered in association with encounter. Interpretation of such information and data should be left to medically-trained personnel. Information provided to patient can be located elsewhere in the medical record under "Patient Instructions". Document created using STT-dictation technology, any transcriptional errors that may result from process are unintentional.    Contact & Pharmacy Preferred: [phone] Home: [phone] (home) Mobile: [phone] (mobile) E-mail: No e-mail address on record  [id number] [place]: [phone]: [address] [place]Phone: [phone]: [phone]  Pre-screening  Kari Hughes offered "in-person" vs "virtual" encounter. She indicated preferring virtual for this encounter.   Reason COVID-19*  Social distancing based on CDC and AMA recommendations.   I contacted Kari Hughes on 12/24/2021 via telephone.      I clearly identified myself as BGillis Santa MD. I verified that I was speaking with the correct person using two identifiers (Name: Kari Hughes and date of birth: [id number].  Consent I sought verbal advanced consent from CPraguefor virtual visit interactions. I informed Kari Hughes of possible security and privacy concerns, risks, and limitations associated with providing "not-in-person"  medical evaluation and management services. I also informed Kari Hughes of the availability of "in-person" appointments. Finally, I informed her that there would be a charge for the virtual visit and that she could be  personally, fully or partially, financially responsible for it. Ms. BTomlinexpressed understanding and agreed to proceed.   Historic Elements   Ms. CMarranda Arakelianis a 71y.o. year old, female patient evaluated today after our last contact on 12/03/2021. Ms. BCadiente has a past medical history of Asthma, Depression, Hypertension, Hypertension (2002), Spinal stenosis (2012), and Vertigo. She also  has a past surgical history that includes pacemaker placement (2019) and Joint replacement (Left, 2013). Ms. BPresseyhas a current medication list which includes the following prescription(s): acetaminophen, brilinta, clopidogrel, cyclobenzaprine, ezetimibe, fluoxetine, hydrocodone-acetaminophen, [START ON 01/06/2022] hydrocodone-acetaminophen, hydroxyzine, lidocaine, loratadine, meloxicam, pregabalin, sertraline, trazodone, and amlodipine. She  reports that she has been smoking cigarettes. She has never used smokeless tobacco. She reports current alcohol use of about 1.0 standard drink of alcohol per week. She reports that she does not currently use drugs after having used the following drugs: Marijuana. Ms. BShounhas No Known Allergies.   HPI  Today, she is being contacted for a post-procedure assessment.   Post-procedure evaluation   Type: Trans-Foraminal Epidural Steroid Injection (Lumbar) #1  Laterality: Bilateral  Level: L5           Imaging: Fluoroscopic guidance Anesthesia: Local anesthesia (1-2% Lidocaine) DOS: 11/27/2021  Performed by: BGillis Santa MD  Purpose: Diagnostic/Therapeutic Indications: Lumbar radicular pain severe enough to impact quality of life or function. 1. Chronic radicular lumbar pain   2. Lumbar radiculopathy   3. Chronic bilateral low back pain with bilateral  sciatica   4. Chronic pain syndrome    NAS-11 Pain score:   Pre-procedure: 8 /10  Post-procedure: 0-No pain/10      Effectiveness:  Initial hour after procedure: 0% Subsequent 4-6 hours post-procedure: 0% for left, 100% for right Analgesia past initial 6 hours: 0 % (patient seems a little confused.  States she doesn't think anything worked.  States she has had an increase in her pain from when she was sitting on a bench and stood up and has had pain ever since, approx 2 weeks.)  Ongoing improvement: 0% for for left, 100% for right side    Pharmacotherapy Assessment   Opioid Analgesic: Hydrocodone 5 mg twice daily as needed, quantity 60/month; MME equals 10    Monitoring: [place] PMP: PDMP reviewed during this encounter.       Pharmacotherapy: No side-effects or adverse reactions reported. Compliance: No problems identified. Effectiveness: Clinically acceptable. Plan: Refer to "POC". UDS:  Summary  Date Value Ref Range Status  10/09/2020 Note  Final    Comment:    ==================================================================== Compliance Drug Analysis, Ur ==================================================================== Test                             Result       Flag       Units  Drug Present and Declared for Prescription Verification   Tramadol                       >3425        EXPECTED   ng/mg creat   O-Desmethyltramadol            2040         EXPECTED   ng/mg creat   N-Desmethyltramadol            1410         EXPECTED   ng/mg creat    Source of tramadol is a prescription medication. O-desmethyltramadol    and N-desmethyltramadol are expected metabolites of tramadol.    Gabapentin                     PRESENT      EXPECTED   Methocarbamol                  PRESENT      EXPECTED   Guaifenesin                    PRESENT      EXPECTED    Guaifenesin may be administered as an over-the-counter or    prescription drug; it may also be present as a breakdown product  of    methocarbamol.    Diphenhydramine                PRESENT      EXPECTED  Drug Present not Declared for Prescription Verification   Acetaminophen                  PRESENT      UNEXPECTED  Drug Absent but Declared for Prescription Verification   Duloxetine                     Not Detected UNEXPECTED ==================================================================== Test                      Result    Flag   Units      Ref Range   Creatinine  146              mg/dL      >=20 ==================================================================== Declared Medications:  The flagging and interpretation on this report are based on the  following declared medications.  Unexpected results may arise from  inaccuracies in the declared medications.   **Note: The testing scope of this panel includes these medications:   Diphenhydramine (Benadryl)  Duloxetine (Cymbalta)  Gabapentin (Neurontin)  Methocarbamol (Robaxin)  Tramadol (Ultram)   **Note: The testing scope of this panel does not include the  following reported medications:   Amlodipine (Norvasc)  Ezetimibe (Zetia)  Loperamide (Imodium)  Ticagrelor (Brilinta) ==================================================================== For clinical consultation, please call [phone]. ====================================================================      Laboratory Chemistry Profile   Renal Lab Results  Component Value Date   BUN 15 04/02/2021   CREATININE 0.86 04/02/2021   BCR 21 05/08/2020   GFRAA 73 05/08/2020   GFRNONAA >60 04/02/2021    Hepatic Lab Results  Component Value Date   AST 23 07/01/2020   ALT 20 07/01/2020   ALBUMIN 3.8 07/01/2020   ALKPHOS 56 07/01/2020    Electrolytes Lab Results  Component Value Date   NA 136 04/02/2021   K 4.0 04/02/2021   CL 103 04/02/2021   CALCIUM 9.0 04/02/2021    Bone No results found for: "VD25OH", "VD125OH2TOT", "PO2423NT6", "RW4315QM0", "25OHVITD1",  "25OHVITD2", "25OHVITD3", "TESTOFREE", "TESTOSTERONE"  Inflammation (CRP: Acute Phase) (ESR: Chronic Phase) No results found for: "CRP", "ESRSEDRATE", "LATICACIDVEN"       Note: Above Lab results reviewed.  Assessment  The primary encounter diagnosis was Chronic radicular lumbar pain. Diagnoses of Lumbar radiculopathy and Chronic bilateral low back pain with bilateral sciatica were also pertinent to this visit.  Plan of Care  Postprocedural follow-up after bilateral L5 transforaminal ESI.  Patient is noticing benefit in regards to her right radiating leg pain but continues to have persistent severe left leg pain. I encouraged her to practice the simple exercises that we have discussed in the past.  I have also offered to place a referral for home health physical therapy. In regards to her medications.  I notified her that she does have 2 prescriptions of hydrocodone remaining at her pharmacy that she can pick up.  She will call pharmacy to confirm.     Follow-up plan:   Return for Keep sch. appt.        Recent Visits Date Type Provider Dept  11/27/21 Procedure visit Gillis Santa, MD [facility]  11/20/21 Procedure visit Gillis Santa, MD [facility]  11/07/21 Office Visit Gillis Santa, MD [facility]  10/22/21 Appointment Gillis Santa, MD [facility]  Showing recent visits within past 90 days and meeting all other requirements Today's Visits Date Type Provider Dept  12/24/21 Office Visit Gillis Santa, MD [facility]  Showing today's visits and meeting all other requirements Future Appointments Date Type Provider Dept  01/30/22 Appointment Gillis Santa, MD [facility]  Showing future appointments within next 90 days and meeting all other requirements  I discussed the assessment and treatment plan with the patient. The patient was provided an opportunity to ask questions and all were answered. The patient agreed  with the plan and demonstrated an understanding of the instructions.  Patient advised to call back or seek an in-person evaluation if the symptoms or condition worsens.  Duration of encounter: 56mnutes.  Note by: BGillis Santa MD Date: 12/24/2021; Time: 9:50 AM

## 2022-01-30 ENCOUNTER — Encounter: Payer: 59 | Admitting: Student in an Organized Health Care Education/Training Program

## 2022-02-11 ENCOUNTER — Ambulatory Visit
Payer: 59 | Attending: Student in an Organized Health Care Education/Training Program | Admitting: Student in an Organized Health Care Education/Training Program

## 2022-02-11 ENCOUNTER — Encounter: Payer: Self-pay | Admitting: Student in an Organized Health Care Education/Training Program

## 2022-02-11 VITALS — BP 141/93 | HR 72 | Temp 97.0°F | Resp 16 | Ht 62.0 in | Wt 151.0 lb

## 2022-02-11 DIAGNOSIS — M47816 Spondylosis without myelopathy or radiculopathy, lumbar region: Secondary | ICD-10-CM

## 2022-02-11 DIAGNOSIS — M5416 Radiculopathy, lumbar region: Secondary | ICD-10-CM | POA: Diagnosis present

## 2022-02-11 DIAGNOSIS — M5442 Lumbago with sciatica, left side: Secondary | ICD-10-CM | POA: Insufficient documentation

## 2022-02-11 DIAGNOSIS — G894 Chronic pain syndrome: Secondary | ICD-10-CM

## 2022-02-11 DIAGNOSIS — M5441 Lumbago with sciatica, right side: Secondary | ICD-10-CM | POA: Insufficient documentation

## 2022-02-11 DIAGNOSIS — R110 Nausea: Secondary | ICD-10-CM | POA: Diagnosis present

## 2022-02-11 DIAGNOSIS — G8929 Other chronic pain: Secondary | ICD-10-CM | POA: Diagnosis present

## 2022-02-11 MED ORDER — PREGABALIN 25 MG PO CAPS: 25.0000 mg | ORAL_CAPSULE | Freq: Every day | ORAL | 5 refills | Status: DC

## 2022-02-11 MED ORDER — HYDROCODONE-ACETAMINOPHEN 5-325 MG PO TABS
1.0000 | ORAL_TABLET | Freq: Two times a day (BID) | ORAL | 0 refills | Status: DC | PRN
Start: 2022-04-12 — End: 2022-04-30

## 2022-02-11 MED ORDER — HYDROCODONE-ACETAMINOPHEN 5-325 MG PO TABS: 1.0000 | ORAL_TABLET | Freq: Two times a day (BID) | ORAL | 0 refills | Status: AC | PRN

## 2022-02-11 NOTE — Progress Notes (Signed)
PROVIDER NOTE: Information contained herein reflects review and annotations entered in association with encounter. Interpretation of such information and data should be left to medically-trained personnel. Information provided to patient can be located elsewhere in the medical record under "Patient Instructions". Document created using STT-dictation technology, any transcriptional errors that may result from process are unintentional.    Patient: Kari Hughes  Service Category: E/M  Provider: Gillis Santa, MD  DOB: February 11, 1951  DOS: 02/11/2022  Referring Provider: Gladstone Lighter, MD  MRN: [id number]  Specialty: Interventional Pain Management  PCP: Gladstone Lighter, MD  Type: Established Patient  Setting: Ambulatory outpatient    Location: Office  Delivery: Face-to-face     HPI  Kari Hughes, a 71 y.o. year old female, is here today because of her Chronic radicular lumbar pain [M54.16, G89.29]. Ms. Carneal's primary complain today is Back Pain and Leg Pain (bilateral) Last encounter: My last encounter with her was on 12/24/21 Pertinent problems: Ms. Barkan has Fibromyalgia; Chronic pain syndrome; Bipolar disorder ([place]); Nerve pain; Lumbar degenerative disc disease; Lumbar facet arthropathy; and Spinal stenosis, lumbar region, with neurogenic claudication on their pertinent problem list. Pain Assessment: Severity of Chronic pain is reported as a 10-Worst pain ever/10. Location: Back Lower/radiates into both legs. Onset: More than a month ago. Quality: Sharp. Timing: Constant. Modifying factor(s): nothing. Vitals:  height is 5' 2" (1.575 m) and weight is 151 lb (68.5 kg). Her temperature is 97 F (36.1 C) (abnormal). Her blood pressure is 141/93 (abnormal) and her pulse is 72. Her respiration is 16 and oxygen saturation is 99%.   Reason for encounter: medication management.  Unfortunately, no significant benefit after bilateral L5 transforaminal ESI.  At this point, continue with medication  management.  Patient requesting referral for home health aide as well as physical therapy which I am happy to place. She has not been taking her Lyrica for the last couple of months.  I explained the importance of medication compliance and for her to take her medications as prescribed.  Patient endorsed understanding. We will also renew her annual urine toxicology screen today for medication compliance and monitoring. Pharmacotherapy Assessment  Analgesic: Hydrocodone 5 mg twice daily as needed, quantity 60/month; MME equals 10    Monitoring: [place] PMP: PDMP reviewed during this encounter.       Pharmacotherapy: No side-effects or adverse reactions reported. Compliance: No problems identified. Effectiveness: Clinically acceptable.  Dewayne Shorter, RN  02/11/2022  1:51 PM  Sign when Signing Visit Nursing Pain Medication Assessment:  Safety precautions to be maintained throughout the outpatient stay will include: orient to surroundings, keep bed in low position, maintain call bell within reach at all times, provide assistance with transfer out of bed and ambulation.  Medication Inspection Compliance: Pill count conducted under aseptic conditions, in front of the patient. Neither the pills nor the bottle was removed from the patient's sight at any time. Once count was completed pills were immediately returned to the patient in their original bottle.  Medication: See above Pill/Patch Count:  4 of 60 pills remain Pill/Patch Appearance: Markings consistent with prescribed medication Bottle Appearance: Standard pharmacy container. Clearly labeled. Filled Date: 06 / 12 / 2023 Last Medication intake:  Yesterday    UDS:  Summary  Date Value Ref Range Status  10/09/2020 Note  Final    Comment:    ==================================================================== Compliance Drug Analysis, Ur ==================================================================== Test  Result        Flag       Units  Drug Present and Declared for Prescription Verification   Tramadol                       >3425        EXPECTED   ng/mg creat   O-Desmethyltramadol            2040         EXPECTED   ng/mg creat   N-Desmethyltramadol            1410         EXPECTED   ng/mg creat    Source of tramadol is a prescription medication. O-desmethyltramadol    and N-desmethyltramadol are expected metabolites of tramadol.    Gabapentin                     PRESENT      EXPECTED   Methocarbamol                  PRESENT      EXPECTED   Guaifenesin                    PRESENT      EXPECTED    Guaifenesin may be administered as an over-the-counter or    prescription drug; it may also be present as a breakdown product of    methocarbamol.    Diphenhydramine                PRESENT      EXPECTED  Drug Present not Declared for Prescription Verification   Acetaminophen                  PRESENT      UNEXPECTED  Drug Absent but Declared for Prescription Verification   Duloxetine                     Not Detected UNEXPECTED ==================================================================== Test                      Result    Flag   Units      Ref Range   Creatinine              146              mg/dL      >=20 ==================================================================== Declared Medications:  The flagging and interpretation on this report are based on the  following declared medications.  Unexpected results may arise from  inaccuracies in the declared medications.   **Note: The testing scope of this panel includes these medications:   Diphenhydramine (Benadryl)  Duloxetine (Cymbalta)  Gabapentin (Neurontin)  Methocarbamol (Robaxin)  Tramadol (Ultram)   **Note: The testing scope of this panel does not include the  following reported medications:   Amlodipine (Norvasc)  Ezetimibe (Zetia)  Loperamide (Imodium)  Ticagrelor  (Brilinta) ==================================================================== For clinical consultation, please call [phone]. ====================================================================      ROS  Constitutional: Denies any fever or chills Gastrointestinal: No reported hemesis, hematochezia, vomiting, or acute GI distress Musculoskeletal:  Low back, bilateral leg pain and weakness Neurological: No reported episodes of acute onset apraxia, aphasia, dysarthria, agnosia, amnesia, paralysis, loss of coordination, or loss of consciousness  Medication Review  FLUoxetine, HYDROcodone-acetaminophen, acetaminophen, amLODipine, clopidogrel, pregabalin, and traZODone  History Review  Allergy: Ms. Turi has No Known Allergies.  Drug: Ms. Bachand  reports that she does not currently use drugs after having used the following drugs: Marijuana. Alcohol:  reports current alcohol use of about 1.0 standard drink of alcohol per week. Tobacco:  reports that she has been smoking cigarettes. She has never used smokeless tobacco. Social: Ms. Pearse  reports that she has been smoking cigarettes. She has never used smokeless tobacco. She reports current alcohol use of about 1.0 standard drink of alcohol per week. She reports that she does not currently use drugs after having used the following drugs: Marijuana. Medical:  has a past medical history of Asthma, Depression, Hypertension, Hypertension (2002), Spinal stenosis (2012), and Vertigo. Surgical: Ms. Zynda  has a past surgical history that includes pacemaker placement (2019) and Joint replacement (Left, 2013). Family: family history includes Alcohol abuse in her brother and brother; Drug abuse in her brother and brother.  Laboratory Chemistry Profile   Renal Lab Results  Component Value Date   BUN 15 04/02/2021   CREATININE 0.86 04/02/2021   BCR 21 05/08/2020   GFRAA 73 05/08/2020   GFRNONAA >60 04/02/2021    Hepatic Lab Results   Component Value Date   AST 23 07/01/2020   ALT 20 07/01/2020   ALBUMIN 3.8 07/01/2020   ALKPHOS 56 07/01/2020    Electrolytes Lab Results  Component Value Date   NA 136 04/02/2021   K 4.0 04/02/2021   CL 103 04/02/2021   CALCIUM 9.0 04/02/2021    Bone No results found for: "VD25OH", "VD125OH2TOT", "TD3220UR4", "YH0623JS2", "25OHVITD1", "25OHVITD2", "25OHVITD3", "TESTOFREE", "TESTOSTERONE"  Inflammation (CRP: Acute Phase) (ESR: Chronic Phase) No results found for: "CRP", "ESRSEDRATE", "LATICACIDVEN"       Note: Above Lab results reviewed.  Physical Exam  General appearance: Well nourished, well developed, and well hydrated. In no apparent acute distress Mental status: Alert, oriented x 3 (person, place, & time)       Respiratory: No evidence of acute respiratory distress Eyes: PERLA Vitals: BP (!) 141/93   Pulse 72   Temp (!) 97 F (36.1 C)   Resp 16   Ht 5' 2" (1.575 m)   Wt 151 lb (68.5 kg)   SpO2 99%   BMI 27.62 kg/m  BMI: Estimated body mass index is 27.62 kg/m as calculated from the following:   Height as of this encounter: 5' 2" (1.575 m).   Weight as of this encounter: 151 lb (68.5 kg). Ideal: Ideal body weight: 50.1 kg (110 lb 7.2 oz) Adjusted ideal body weight: 57.5 kg (126 lb 10.7 oz)  Lumbar Exam  Skin & Axial Inspection: No masses, redness, or swelling Alignment: Asymmetric Functional ROM: Pain restricted ROM affecting both sides Stability: No instability detected Muscle Tone/Strength: Functionally intact. No obvious neuro-muscular anomalies detected. Sensory (Neurological): Musculoskeletal pain pattern and dermatomal   Gait & Posture Assessment  Ambulation: Patient came in today in a wheel chair Gait: Significantly limited. Dependent on assistive device to ambulate Posture: Difficulty standing up straight, due to pain    Lower Extremity Exam      Side: Right lower extremity   Side: Left lower extremity  Stability: No instability observed            Stability: No instability observed          Skin & Extremity Inspection: Skin color, temperature, and hair growth are WNL. No peripheral edema or cyanosis. No masses, redness, swelling, asymmetry, or associated skin lesions. No contractures.   Skin & Extremity Inspection: Skin color, temperature, and hair  growth are WNL. No peripheral edema or cyanosis. No masses, redness, swelling, asymmetry, or associated skin lesions. No contractures.  Functional ROM: Pain restricted ROM for hip and knee joints           Functional ROM: Pain restricted ROM for hip and knee joints          Muscle Tone/Strength: Functionally intact. No obvious neuro-muscular anomalies detected.   Muscle Tone/Strength: Functionally intact. No obvious neuro-muscular anomalies detected.  Sensory (Neurological): Musculoskeletal pain pattern         Sensory (Neurological): Musculoskeletal pain pattern        DTR: Patellar: deferred today Achilles: deferred today Plantar: deferred today   DTR: Patellar: deferred today Achilles: deferred today Plantar: deferred today  Palpation: No palpable anomalies   Palpation: No palpable anomalies    Assessment   Diagnosis Status  1. Chronic radicular lumbar pain   2. Lumbar radiculopathy   3. Chronic bilateral low back pain with bilateral sciatica   4. Lumbar facet arthropathy   5. Nausea without vomiting   6. Chronic pain syndrome    Controlled Controlled Controlled   Plan of Care   Ms. Yuliya Suber has a current medication list which includes the following long-term medication(s): amlodipine, fluoxetine, trazodone, and pregabalin.  Pharmacotherapy (Medications Ordered): Meds ordered this encounter  Medications   pregabalin (LYRICA) 25 MG capsule    Sig: Take 1-2 capsules (25-50 mg total) by mouth at bedtime.    Dispense:  90 capsule    Refill:  5   HYDROcodone-acetaminophen (NORCO/VICODIN) 5-325 MG tablet    Sig: Take 1 tablet by mouth 2 (two) times daily as needed for  moderate pain.    Dispense:  60 tablet    Refill:  0   HYDROcodone-acetaminophen (NORCO/VICODIN) 5-325 MG tablet    Sig: Take 1 tablet by mouth 2 (two) times daily as needed for moderate pain.    Dispense:  60 tablet    Refill:  0   HYDROcodone-acetaminophen (NORCO/VICODIN) 5-325 MG tablet    Sig: Take 1 tablet by mouth 2 (two) times daily as needed for moderate pain.    Dispense:  60 tablet    Refill:  0   Orders:  Orders Placed This Encounter  Procedures   ToxASSURE Select 13 (MW), Urine    Volume: 30 ml(s). Minimum 3 ml of urine is needed. Document temperature of fresh sample. Indications: Long term (current) use of opiate analgesic (Z79.891)    Order Specific Question:   Release to patient    Answer:   Immediate   Ambulatory referral to [facility]    Referral Priority:   Routine    Referral Type:   Home Health Care    Referral Reason:   Specialty Services Required    Requested Specialty:   [place]    Number of Visits Requested:   1   Follow-up plan:   Return in about 3 months (around 05/14/2022) for Medication Management, in person.    Recent Visits Date Type Provider Dept  12/24/21 Office Visit Gillis Santa, MD [facility]  11/27/21 Procedure visit Gillis Santa, MD [facility]  11/20/21 Procedure visit Gillis Santa, MD [facility]  Showing recent visits within past 90 days and meeting all other requirements Today's Visits Date Type Provider Dept  02/11/22 Office Visit Gillis Santa, MD [facility]  Showing today's visits and meeting all other requirements Future Appointments No visits were found meeting these conditions. Showing future  appointments within next 90 days and meeting all other requirements  I discussed the assessment and treatment plan with the patient. The patient was provided an opportunity to ask questions and all were answered. The patient agreed with the plan and demonstrated an understanding  of the instructions.  Patient advised to call back or seek an in-person evaluation if the symptoms or condition worsens.  Duration of encounter: 59mnutes.  Total time on encounter, as per AMA guidelines included both the face-to-face and non-face-to-face time personally spent by the physician and/or other qualified health care professional(s) on the day of the encounter (includes time in activities that require the physician or other qualified health care professional and does not include time in activities normally performed by clinical staff). Physician's time may include the following activities when performed: preparing to see the patient (eg, review of tests, pre-charting review of records) obtaining and/or reviewing separately obtained history performing a medically appropriate examination and/or evaluation counseling and educating the patient/family/caregiver ordering medications, tests, or procedures referring and communicating with other health care professionals (when not separately reported) documenting clinical information in the electronic or other health record independently interpreting results (not separately reported) and communicating results to the patient/ family/caregiver care coordination (not separately reported)  Note by: BGillis Santa MD Date: 02/11/2022; Time: 2:17 PM

## 2022-02-11 NOTE — Patient Instructions (Signed)
You have been referred for Home Health assistance

## 2022-02-11 NOTE — Progress Notes (Signed)
Nursing Pain Medication Assessment:  Safety precautions to be maintained throughout the outpatient stay will include: orient to surroundings, keep bed in low position, maintain call bell within reach at all times, provide assistance with transfer out of bed and ambulation.  Medication Inspection Compliance: Pill count conducted under aseptic conditions, in front of the patient. Neither the pills nor the bottle was removed from the patient's sight at any time. Once count was completed pills were immediately returned to the patient in their original bottle.  Medication: See above Pill/Patch Count:  4 of 60 pills remain Pill/Patch Appearance: Markings consistent with prescribed medication Bottle Appearance: Standard pharmacy container. Clearly labeled. Filled Date: 06 / 12 / 2023 Last Medication intake:  Yesterday

## 2022-02-11 NOTE — Addendum Note (Signed)
Addended by: Edward Jolly on: 02/11/2022 03:14 PM   Modules accepted: Orders

## 2022-02-18 ENCOUNTER — Telehealth: Payer: Self-pay

## 2022-02-18 NOTE — Telephone Encounter (Signed)
Patient notified that we do not provide durable medical equipment.  Patient notifeid to contact her PCP

## 2022-02-18 NOTE — Telephone Encounter (Signed)
She is taking her medicine but the pain is making her depressed. She cant get up and do things. She says she is tired because she is in so much pain. She states it is taking a toll on her and she cant do anything, everything is hard. Do you have any suggestions on something you can give her to help her get around. Like a scooter or any other options. She said please get back to her today because she is hurting.

## 2022-02-19 ENCOUNTER — Encounter: Payer: 59 | Admitting: Cardiology

## 2022-02-26 LAB — DRUG SCREEN 10 W/CONF, SERUM
Amphetamines, IA: NEGATIVE ng/mL
Barbiturates, IA: NEGATIVE ug/mL
Benzodiazepines, IA: NEGATIVE ng/mL
Cocaine & Metabolite, IA: NEGATIVE ng/mL
Methadone, IA: NEGATIVE ng/mL
Opiates, IA: POSITIVE ng/mL — AB
Oxycodones, IA: NEGATIVE ng/mL
Phencyclidine, IA: NEGATIVE ng/mL
Propoxyphene, IA: NEGATIVE ng/mL
THC(Marijuana) Metabolite, IA: NEGATIVE ng/mL

## 2022-02-26 LAB — OPIATES,MS,WB/SP RFX
6-Acetylmorphine: NEGATIVE
Codeine: NEGATIVE ng/mL
Dihydrocodeine: 1.2 ng/mL
Hydrocodone: 10.1 ng/mL
Hydromorphone: NEGATIVE ng/mL
Morphine: NEGATIVE ng/mL
Opiate Confirmation: POSITIVE

## 2022-02-26 LAB — OXYCODONES,MS,WB/SP RFX
Oxycocone: NEGATIVE ng/mL
Oxycodones Confirmation: NEGATIVE
Oxymorphone: NEGATIVE ng/mL

## 2022-02-28 ENCOUNTER — Encounter: Payer: 59 | Admitting: Student

## 2022-04-23 ENCOUNTER — Encounter: Payer: 59 | Admitting: Cardiology

## 2022-04-30 ENCOUNTER — Encounter: Payer: Self-pay | Admitting: Student in an Organized Health Care Education/Training Program

## 2022-04-30 ENCOUNTER — Ambulatory Visit
Payer: 59 | Attending: Student in an Organized Health Care Education/Training Program | Admitting: Student in an Organized Health Care Education/Training Program

## 2022-04-30 VITALS — BP 139/80 | HR 79 | Temp 97.2°F | Resp 16 | Ht 62.0 in | Wt 151.0 lb

## 2022-04-30 DIAGNOSIS — M5442 Lumbago with sciatica, left side: Secondary | ICD-10-CM | POA: Diagnosis present

## 2022-04-30 DIAGNOSIS — M5441 Lumbago with sciatica, right side: Secondary | ICD-10-CM | POA: Diagnosis present

## 2022-04-30 DIAGNOSIS — M47816 Spondylosis without myelopathy or radiculopathy, lumbar region: Secondary | ICD-10-CM | POA: Diagnosis present

## 2022-04-30 DIAGNOSIS — G894 Chronic pain syndrome: Secondary | ICD-10-CM | POA: Insufficient documentation

## 2022-04-30 DIAGNOSIS — R110 Nausea: Secondary | ICD-10-CM | POA: Insufficient documentation

## 2022-04-30 DIAGNOSIS — G8929 Other chronic pain: Secondary | ICD-10-CM | POA: Insufficient documentation

## 2022-04-30 DIAGNOSIS — M5416 Radiculopathy, lumbar region: Secondary | ICD-10-CM | POA: Insufficient documentation

## 2022-04-30 MED ORDER — HYDROCODONE-ACETAMINOPHEN 5-325 MG PO TABS: 1.0000 | ORAL_TABLET | Freq: Three times a day (TID) | ORAL | 0 refills | Status: DC | PRN

## 2022-04-30 MED ORDER — HYDROCODONE-ACETAMINOPHEN 5-325 MG PO TABS: 1.0000 | ORAL_TABLET | Freq: Three times a day (TID) | ORAL | 0 refills | Status: AC | PRN

## 2022-04-30 MED ORDER — PREGABALIN 25 MG PO CAPS: 50.0000 mg | ORAL_CAPSULE | Freq: Every day | ORAL | 5 refills | Status: DC

## 2022-04-30 NOTE — Progress Notes (Signed)
PROVIDER NOTE: Information contained herein reflects review and annotations entered in association with encounter. Interpretation of such information and data should be left to medically-trained personnel. Information provided to patient can be located elsewhere in the medical record under "Patient Instructions". Document created using STT-dictation technology, any transcriptional errors that may result from process are unintentional.    Patient: Kari Hughes  Service Category: E/M  Provider: Gillis Santa, MD  DOB: Dec 03, 1950  DOS: 04/30/2022  Referring Provider: Gladstone Lighter, MD  MRN: [id number]  Specialty: Interventional Pain Management  PCP: Gladstone Lighter, MD  Type: Established Patient  Setting: Ambulatory outpatient    Location: Office  Delivery: Face-to-face     HPI  Kari Hughes, a 71 y.o. year old female, is here today because of her Chronic radicular lumbar pain [M54.16, G89.29]. [name]'s primary complain today is Hand Pain and Abdominal Pain Last encounter: My last encounter with her was on 12/24/21 Pertinent problems: [name] has Fibromyalgia; Chronic pain syndrome; Bipolar disorder ([place]); Nerve pain; Lumbar degenerative disc disease; Lumbar facet arthropathy; and Spinal stenosis, lumbar region, with neurogenic claudication on their pertinent problem list. Pain Assessment: Severity of Chronic pain is reported as a 7 /10. Location: Hand Right, Left/radiates throughout hands. Onset: More than a month ago. Quality:  . Timing: Constant. Modifying factor(s): nothing. Vitals:  height is _0  (1.575 m) and weight is 151 lb (68.5 kg). Her temperature is 97.2 F (36.2 C) (abnormal). Her blood pressure is 139/80 and her pulse is 79. Her respiration is 16 and oxygen saturation is 98%.   Reason for encounter: medication management.   No change in medical history since last visit.  Patient's pain is slightly worse and she states on certain days she has to take an extra  hydrocodone in the evening.  She states that the hydrocodone does help her manage her pain and she is wondering if she can increase it to every 8 hours rather than every 12 hours as needed.  Patient continues Lyrica as prescribed.  States that it provides pain relief and improvement in functional status.  Patient states that she is going to [place] to visit her daughter for the next 2 weeks.   Pharmacotherapy Assessment  Analgesic: Hydrocodone 5 mg every 8 hours as needed, quantity 90/month; MME equals 15    Monitoring: [place] PMP: PDMP reviewed during this encounter.       Pharmacotherapy: No side-effects or adverse reactions reported. Compliance: No problems identified. Effectiveness: Clinically acceptable.  Dewayne Shorter, RN  04/30/2022  2:07 PM  Sign when Signing Visit Nursing Pain Medication Assessment:  Safety precautions to be maintained throughout the outpatient stay will include: orient to surroundings, keep bed in low position, maintain call bell within reach at all times, provide assistance with transfer out of bed and ambulation.  Medication Inspection Compliance: Ms. Facemire did not comply with our request to bring her pills to be counted. She was reminded that bringing the medication bottles, even when empty, is a requirement.  Medication: None brought in. Pill/Patch Count: None available to be counted. Bottle Appearance: No container available. Did not bring bottle(s) to appointment. Filled Date: N/A Last Medication intake:  Today  UDS:  Summary  Date Value Ref Range Status  10/09/2020 Note  Final    Comment:    ==================================================================== Compliance Drug Analysis, Ur ==================================================================== Test  Result       Flag       Units  Drug Present and Declared for Prescription Verification   Tramadol                       >3425        EXPECTED   ng/mg creat    O-Desmethyltramadol            2040         EXPECTED   ng/mg creat   N-Desmethyltramadol            1410         EXPECTED   ng/mg creat    Source of tramadol is a prescription medication. O-desmethyltramadol    and N-desmethyltramadol are expected metabolites of tramadol.    Gabapentin                     PRESENT      EXPECTED   Methocarbamol                  PRESENT      EXPECTED   Guaifenesin                    PRESENT      EXPECTED    Guaifenesin may be administered as an over-the-counter or    prescription drug; it may also be present as a breakdown product of    methocarbamol.    Diphenhydramine                PRESENT      EXPECTED  Drug Present not Declared for Prescription Verification   Acetaminophen                  PRESENT      UNEXPECTED  Drug Absent but Declared for Prescription Verification   Duloxetine                     Not Detected UNEXPECTED ==================================================================== Test                      Result    Flag   Units      Ref Range   Creatinine              146              mg/dL      >=20 ==================================================================== Declared Medications:  The flagging and interpretation on this report are based on the  following declared medications.  Unexpected results may arise from  inaccuracies in the declared medications.   **Note: The testing scope of this panel includes these medications:   Diphenhydramine (Benadryl)  Duloxetine (Cymbalta)  Gabapentin (Neurontin)  Methocarbamol (Robaxin)  Tramadol (Ultram)   **Note: The testing scope of this panel does not include the  following reported medications:   Amlodipine (Norvasc)  Ezetimibe (Zetia)  Loperamide (Imodium)  Ticagrelor (Brilinta) ==================================================================== For clinical consultation, please call [phone]. ====================================================================       ROS  Constitutional: Denies any fever or chills Gastrointestinal: No reported hemesis, hematochezia, vomiting, or acute GI distress Musculoskeletal:  Low back, bilateral leg pain and weakness Neurological: No reported episodes of acute onset apraxia, aphasia, dysarthria, agnosia, amnesia, paralysis, loss of coordination, or loss of consciousness  Medication Review  FLUoxetine, HYDROcodone-acetaminophen, acetaminophen, amLODipine, clopidogrel, pregabalin, and traZODone  History Review  Allergy: Ms. Tijerino has No Known Allergies.  Drug: Ms. Adee  reports that she does not currently use drugs after having used the following drugs: Marijuana. Alcohol:  reports current alcohol use of about 1.0 standard drink of alcohol per week. Tobacco:  reports that she has been smoking cigarettes. She has never used smokeless tobacco. Social: Ms. Terrero  reports that she has been smoking cigarettes. She has never used smokeless tobacco. She reports current alcohol use of about 1.0 standard drink of alcohol per week. She reports that she does not currently use drugs after having used the following drugs: Marijuana. Medical:  has a past medical history of Asthma, Depression, Hypertension, Hypertension (2002), Spinal stenosis (2012), and Vertigo. Surgical: Ms. Noori  has a past surgical history that includes pacemaker placement (2019) and Joint replacement (Left, 2013). Family: family history includes Alcohol abuse in her brother and brother; Drug abuse in her brother and brother.  Laboratory Chemistry Profile   Renal Lab Results  Component Value Date   BUN 15 04/02/2021   CREATININE 0.86 04/02/2021   BCR 21 05/08/2020   GFRAA 73 05/08/2020   GFRNONAA >60 04/02/2021    Hepatic Lab Results  Component Value Date   AST 23 07/01/2020   ALT 20 07/01/2020   ALBUMIN 3.8 07/01/2020   ALKPHOS 56 07/01/2020    Electrolytes Lab Results  Component Value Date   NA 136 04/02/2021   K 4.0 04/02/2021   CL  103 04/02/2021   CALCIUM 9.0 04/02/2021    Bone No results found for: "VD25OH", "VD125OH2TOT", "YP9509TO6", "ZT2458KD9", "25OHVITD1", "25OHVITD2", "25OHVITD3", "TESTOFREE", "TESTOSTERONE"  Inflammation (CRP: Acute Phase) (ESR: Chronic Phase) No results found for: "CRP", "ESRSEDRATE", "LATICACIDVEN"       Note: Above Lab results reviewed.  Physical Exam  General appearance: Well nourished, well developed, and well hydrated. In no apparent acute distress Mental status: Alert, oriented x 3 (person, place, & time)       Respiratory: No evidence of acute respiratory distress Eyes: PERLA Vitals: BP 139/80   Pulse 79   Temp (!) 97.2 F (36.2 C)   Resp 16   Ht _0  (1.575 m)   Wt 151 lb (68.5 kg)   SpO2 98%   BMI 27.62 kg/m  BMI: Estimated body mass index is 27.62 kg/m as calculated from the following:   Height as of this encounter: _1  (1.575 m).   Weight as of this encounter: 151 lb (68.5 kg). Ideal: Ideal body weight: 50.1 kg (110 lb 7.2 oz) Adjusted ideal body weight: 57.5 kg (126 lb 10.7 oz)  Lumbar Exam  Skin & Axial Inspection: No masses, redness, or swelling Alignment: Asymmetric Functional ROM: Pain restricted ROM affecting both sides Stability: No instability detected Muscle Tone/Strength: Functionally intact. No obvious neuro-muscular anomalies detected. Sensory (Neurological): Musculoskeletal pain pattern and dermatomal   Gait & Posture Assessment  Ambulation: Patient came in today in a wheel chair Gait: Significantly limited. Dependent on assistive device to ambulate Posture: Difficulty standing up straight, due to pain    Lower Extremity Exam      Side: Right lower extremity   Side: Left lower extremity  Stability: No instability observed           Stability: No instability observed          Skin & Extremity Inspection: Skin color, temperature, and hair growth are WNL. No peripheral edema or cyanosis. No masses, redness, swelling, asymmetry, or associated skin  lesions. No contractures.   Skin & Extremity Inspection: Skin color, temperature, and hair growth  are WNL. No peripheral edema or cyanosis. No masses, redness, swelling, asymmetry, or associated skin lesions. No contractures.  Functional ROM: Pain restricted ROM for hip and knee joints           Functional ROM: Pain restricted ROM for hip and knee joints          Muscle Tone/Strength: Functionally intact. No obvious neuro-muscular anomalies detected.   Muscle Tone/Strength: Functionally intact. No obvious neuro-muscular anomalies detected.  Sensory (Neurological): Musculoskeletal pain pattern         Sensory (Neurological): Musculoskeletal pain pattern        DTR: Patellar: deferred today Achilles: deferred today Plantar: deferred today   DTR: Patellar: deferred today Achilles: deferred today Plantar: deferred today  Palpation: No palpable anomalies   Palpation: No palpable anomalies    Assessment   Diagnosis Status  1. Chronic radicular lumbar pain   2. Lumbar radiculopathy   3. Chronic bilateral low back pain with bilateral sciatica   4. Lumbar facet arthropathy   5. Nausea without vomiting   6. Chronic pain syndrome    Controlled Controlled Controlled   Plan of Care   Ms. Auriah Rodenberg has a current medication list which includes the following long-term medication(s): amlodipine, fluoxetine, trazodone, and pregabalin.  Pharmacotherapy (Medications Ordered): Meds ordered this encounter  Medications   HYDROcodone-acetaminophen (NORCO/VICODIN) 5-325 MG tablet    Sig: Take 1 tablet by mouth every 8 (eight) hours as needed for moderate pain.    Dispense:  90 tablet    Refill:  0   HYDROcodone-acetaminophen (NORCO/VICODIN) 5-325 MG tablet    Sig: Take 1 tablet by mouth every 8 (eight) hours as needed for moderate pain.    Dispense:  90 tablet    Refill:  0   HYDROcodone-acetaminophen (NORCO/VICODIN) 5-325 MG tablet    Sig: Take 1 tablet by mouth every 8 (eight) hours as  needed for moderate pain.    Dispense:  90 tablet    Refill:  0   pregabalin (LYRICA) 25 MG capsule    Sig: Take 2-3 capsules (50-75 mg total) by mouth at bedtime.    Dispense:  90 capsule    Refill:  5  UDS up-to-date and appropriate.   Orders:  No orders of the defined types were placed in this encounter.  Follow-up plan:   Return in about 3 months (around 07/31/2022) for Medication Management, in person.    Recent Visits Date Type Provider Dept  02/11/22 Office Visit Gillis Santa, MD [facility]  Showing recent visits within past 90 days and meeting all other requirements Today's Visits Date Type Provider Dept  04/30/22 Office Visit Gillis Santa, MD [facility]  Showing today's visits and meeting all other requirements Future Appointments Date Type Provider Dept  07/24/22 Appointment Gillis Santa, MD [facility]  Showing future appointments within next 90 days and meeting all other requirements  I discussed the assessment and treatment plan with the patient. The patient was provided an opportunity to ask questions and all were answered. The patient agreed with the plan and demonstrated an understanding of the instructions.  Patient advised to call back or seek an in-person evaluation if the symptoms or condition worsens.  Duration of encounter: 84mnutes.  Total time on encounter, as per AMA guidelines included both the face-to-face and non-face-to-face time personally spent by the physician and/or other qualified health care professional(s) on the day of the encounter (includes time in activities that require the physician or other  qualified health care professional and does not include time in activities normally performed by clinical staff). Physician's time may include the following activities when performed: preparing to see the patient (eg, review of tests, pre-charting review of records) obtaining and/or reviewing separately obtained  history performing a medically appropriate examination and/or evaluation counseling and educating the patient/family/caregiver ordering medications, tests, or procedures referring and communicating with other health care professionals (when not separately reported) documenting clinical information in the electronic or other health record independently interpreting results (not separately reported) and communicating results to the patient/ family/caregiver care coordination (not separately reported)  Note by: Gillis Santa, MD Date: 04/30/2022; Time: 3:25 PM

## 2022-04-30 NOTE — Progress Notes (Signed)
Nursing Pain Medication Assessment:  Safety precautions to be maintained throughout the outpatient stay will include: orient to surroundings, keep bed in low position, maintain call bell within reach at all times, provide assistance with transfer out of bed and ambulation.  Medication Inspection Compliance: Kari Hughes did not comply with our request to bring her pills to be counted. She was reminded that bringing the medication bottles, even when empty, is a requirement.  Medication: None brought in. Pill/Patch Count: None available to be counted. Bottle Appearance: No container available. Did not bring bottle(s) to appointment. Filled Date: N/A Last Medication intake:  Today

## 2022-05-08 ENCOUNTER — Encounter: Payer: 59 | Admitting: Student in an Organized Health Care Education/Training Program

## 2022-05-14 ENCOUNTER — Ambulatory Visit: Payer: 59 | Attending: Cardiology | Admitting: Cardiology

## 2022-05-14 ENCOUNTER — Encounter: Payer: Self-pay | Admitting: Cardiology

## 2022-05-14 VITALS — BP 160/98 | HR 64 | Ht 62.0 in | Wt 163.0 lb

## 2022-05-14 DIAGNOSIS — Z950 Presence of cardiac pacemaker: Secondary | ICD-10-CM | POA: Diagnosis not present

## 2022-05-14 DIAGNOSIS — I10 Essential (primary) hypertension: Secondary | ICD-10-CM

## 2022-05-14 DIAGNOSIS — I495 Sick sinus syndrome: Secondary | ICD-10-CM | POA: Diagnosis not present

## 2022-05-14 MED ORDER — AMLODIPINE BESYLATE 10 MG PO TABS: 10.0000 mg | ORAL_TABLET | Freq: Every day | ORAL | 3 refills | Status: DC

## 2022-05-14 NOTE — Progress Notes (Signed)
Electrophysiology Office Follow up Visit Note:    Date:  05/14/2022   ID:  Kari Hughes, DOB 06-16-1951, MRN [id number]  PCP:  Enid Baas, MD  [facility] Cardiologist:  None  [facility] Electrophysiologist:  Lanier Prude, MD    Interval History:    Kari Hughes is a 71 y.o. female who presents for a follow up visit. They were last seen in clinic October 03, 2020 for her hypertension and pacemaker.  After our last appointment there were remote monitoring transmissions sent to the clinic until the end of 2022.  There have been no transmissions during 2023.  She has been doing well.  She has a trip planned to [place] to visit her daughter who just had open heart surgery.  She had a valve replaced.  No problems with her pacemaker.      Past Medical History:  Diagnosis Date   Asthma    Depression    Hypertension    Hypertension 2002   Spinal stenosis 2012   Vertigo     Past Surgical History:  Procedure Laterality Date   JOINT REPLACEMENT Left 2013   PACEMAKER PLACEMENT  2019    Current Medications: Current Meds  Medication Sig   acetaminophen (TYLENOL) 500 MG tablet Take 1,000 mg by mouth every 6 (six) hours as needed.   clopidogrel (PLAVIX) 75 MG tablet Take 75 mg by mouth daily.   FLUoxetine (PROZAC) 20 MG capsule Take 20 mg by mouth daily.   HYDROcodone-acetaminophen (NORCO/VICODIN) 5-325 MG tablet Take 1 tablet by mouth every 8 (eight) hours as needed for moderate pain.   pregabalin (LYRICA) 25 MG capsule Take 2-3 capsules (50-75 mg total) by mouth at bedtime.   traZODone (DESYREL) 100 MG tablet TAKE 1 TABLET BY MOUTH EVERYDAY AT BEDTIME   [DISCONTINUED] amLODipine (NORVASC) 10 MG tablet Take 1 tablet (10 mg total) by mouth daily.     Allergies:   Patient has no known allergies.   Social History   Socioeconomic History   Marital status: Single    Spouse name: Not on file   Number of children: 3   Years of education: Not on file    Highest education level: Not on file  Occupational History   Not on file  Tobacco Use   Smoking status: Every Day    Years: 10.00    Types: Cigarettes, E-cigarettes   Smokeless tobacco: Never   Tobacco comments:    5 cigarettes per day  Vaping Use   Vaping Use: Never used  Substance and Sexual Activity   Alcohol use: Yes    Alcohol/week: 1.0 standard drink of alcohol    Types: 1 Glasses of wine per week    Comment: occasional   Drug use: Not Currently    Types: Marijuana    Comment: stopped 6 months ago   Sexual activity: Not Currently  Other Topics Concern   Not on file  Social History Narrative   Not on file   Social Determinants of Health   Financial Resource Strain: Not on file  Food Insecurity: Not on file  Transportation Needs: Not on file  Physical Activity: Not on file  Stress: Not on file  Social Connections: Not on file     Family History: The patient's family history includes Alcohol abuse in her brother and brother; Drug abuse in her brother and brother.  ROS:   Please see the history of present illness.    All other systems reviewed and are negative.  EKGs/Labs/Other Studies Reviewed:    The following studies were reviewed today:  May 14, 2022 in clinic device interrogation personally reviewed Lead and battery parameters are stable.  She has not been remote monitoring but we will get her set up with a new monitor for home.    Recent Labs: No results found for requested labs within last 365 days.  Recent Lipid Panel    Component Value Date/Time   CHOL 225 (H) 05/08/2020 1430   TRIG 174 (H) 05/08/2020 1430   HDL 86 05/08/2020 1430   LDLCALC 110 (H) 05/08/2020 1430    Physical Exam:    VS:  BP (!) 160/98   Pulse 64   Ht 5\' 2"  (1.575 m)   Wt 163 lb (73.9 kg)   BMI 29.81 kg/m     Wt Readings from Last 3 Encounters:  05/14/22 163 lb (73.9 kg)  04/30/22 151 lb (68.5 kg)  02/11/22 151 lb (68.5 kg)     GEN:  Well nourished, well  developed in no acute distress HEENT: Normal NECK: No JVD; No carotid bruits LYMPHATICS: No lymphadenopathy CARDIAC: RRR, no murmurs, rubs, gallops.  Pacemaker pocket well-healed RESPIRATORY:  Clear to auscultation without rales, wheezing or rhonchi  ABDOMEN: Soft, non-tender, non-distended MUSCULOSKELETAL:  No edema; No deformity  SKIN: Warm and dry NEUROLOGIC:  Alert and oriented x 3 PSYCHIATRIC:  Normal affect        ASSESSMENT:    1. Sick sinus syndrome ([place])   2. Pacemaker   3. Primary hypertension    PLAN:    In order of problems listed above:  #Permanent pacemaker in situ Device functioning appropriately.  We will reestablish her for remote monitoring.  We will need to get a new device sent out to her house.  #Hypertension Above goal today.  She had not been taking amlodipine.  We will refill this for her today.  Recommend checking blood pressures 1-2 times per week at home and recording the values.  Recommend bringing these recordings to the primary care physician.  Follow-up in 1 year with APP.  Medication Adjustments/Labs and Tests Ordered: Current medicines are reviewed at length with the patient today.  Concerns regarding medicines are outlined above.  No orders of the defined types were placed in this encounter.  Meds ordered this encounter  Medications   amLODipine (NORVASC) 10 MG tablet    Sig: Take 1 tablet (10 mg total) by mouth daily.    Dispense:  90 tablet    Refill:  3     Signed, Lars Mage, MD, [facility], [facility] 05/14/2022 10:54 AM    Electrophysiology [facility]

## 2022-05-14 NOTE — Patient Instructions (Signed)
Medication Instructions:  None  *If you need a refill on your cardiac medications before your next appointment, please call your pharmacy*   Lab Work: none If you have labs (blood work) drawn today and your tests are completely normal, you will receive your results only by: [place] (if you have MyChart) OR A paper copy in the mail If you have any lab test that is abnormal or we need to change your treatment, we will call you to review the results.   Testing/Procedures: None    Follow-Up: At [facility], you and your health needs are our priority.  As part of our continuing mission to provide you with exceptional heart care, we have created designated Provider Care Teams.  These Care Teams include your primary Cardiologist (physician) and Advanced Practice Providers (APPs -  Physician Assistants and Nurse Practitioners) who all work together to provide you with the care you need, when you need it.  We recommend signing up for the patient portal called "MyChart".  Sign up information is provided on this After Visit Summary.  MyChart is used to connect with patients for Virtual Visits (Telemedicine).  Patients are able to view lab/test results, encounter notes, upcoming appointments, etc.  Non-urgent messages can be sent to your provider as well.   To learn more about what you can do with MyChart, go to [email or web address].    Your next appointment:   1 year(s)  The format for your next appointment:   In Person  Provider:   You will see one of the following Advanced Practice Providers on your designated Care Team:   Tommye Standard, [place] Legrand Como "Jonni Sanger" Chalmers Cater, [place]      Other Instructions None   Important Information About Sugar

## 2022-05-23 ENCOUNTER — Telehealth: Payer: Self-pay

## 2022-05-23 NOTE — Telephone Encounter (Signed)
-----   Message from Dorathy Daft, RN sent at 05/14/2022 11:04 AM EDT ----- Regarding: Can you order this patient a new monitor.... she is biotronik

## 2022-05-23 NOTE — Telephone Encounter (Signed)
I ordered he patient a new monitor. She should receive it in 3-5 business days.

## 2022-06-09 NOTE — Telephone Encounter (Signed)
Unable to reach Pt with phone number on file.  Left message for sister requesting call back to get Pt's correct information.  Need to confirm Pt has received a BIO monitor and it is plugged in.

## 2022-06-09 NOTE — Telephone Encounter (Signed)
No messages received for at least 21 days No messages received for 24 days (since last activation). The patient will be deactivated in 66 days.  Need to contact Pt to ensure she received new monitor and it is plugged in.

## 2022-06-11 NOTE — Telephone Encounter (Signed)
Attempted to contact patient for follow up on monitor. No answer, LMTCB.

## 2022-06-12 NOTE — Telephone Encounter (Signed)
cv

## 2022-06-13 NOTE — Telephone Encounter (Signed)
Sending letter to Pt requesting call to device clinic with correct phone number to assist Pt with setting up remote monitoring.  Await call back from Pt.

## 2022-07-01 ENCOUNTER — Telehealth: Payer: Self-pay

## 2022-07-01 ENCOUNTER — Telehealth: Payer: Self-pay | Admitting: Student in an Organized Health Care Education/Training Program

## 2022-07-01 NOTE — Telephone Encounter (Signed)
Attempted to call patient x 2. The call can not be completed at this time.

## 2022-07-01 NOTE — Telephone Encounter (Signed)
Patient called asking to speak with Dr Cherylann Ratel about pain she is having, meds are not helping. Says the pain is jumping to different places on her body. She would like to speak with someone about what she can do.

## 2022-07-11 ENCOUNTER — Telehealth: Payer: Self-pay | Admitting: Student in an Organized Health Care Education/Training Program

## 2022-07-11 NOTE — Telephone Encounter (Signed)
PT stated that she has been falling. PT stated she having sharp pain in feet going into her back. PT stated that if you can't reach her please give her sister a call at [phone]. Thanks

## 2022-07-11 NOTE — Telephone Encounter (Signed)
Patient reports that she had a fall and she has some new issues with sharpness in her feet and hands and she is dropping things.  She feels like that this is since the fall. I told her that BL is not here in the office, since this is an acute situation she can call her PCP and see what they instruct her to do.  If she feels that she needs attention she can go to an acute care.  She does have hydrocodone - apap as well as LYrica and she asked if she had any refills on lyrica and she does.  She states she has a few hydrocodone left that she can take.  Patient went on and on about other issues such as hair falling out etc.  She states she just wanted BL to know what was going on with her.  Went over instructions again re; medications, ice, PCP or acute care if she feels that is warranted.  I did tell her I would pass this message along to BL.

## 2022-07-21 ENCOUNTER — Other Ambulatory Visit: Payer: Self-pay

## 2022-07-21 DIAGNOSIS — R42 Dizziness and giddiness: Secondary | ICD-10-CM | POA: Insufficient documentation

## 2022-07-21 DIAGNOSIS — G894 Chronic pain syndrome: Secondary | ICD-10-CM | POA: Diagnosis not present

## 2022-07-21 DIAGNOSIS — Z79899 Other long term (current) drug therapy: Secondary | ICD-10-CM | POA: Insufficient documentation

## 2022-07-21 DIAGNOSIS — Z1152 Encounter for screening for COVID-19: Secondary | ICD-10-CM | POA: Diagnosis not present

## 2022-07-21 DIAGNOSIS — Z91148 Patient's other noncompliance with medication regimen for other reason: Secondary | ICD-10-CM | POA: Insufficient documentation

## 2022-07-21 DIAGNOSIS — R52 Pain, unspecified: Secondary | ICD-10-CM | POA: Diagnosis present

## 2022-07-21 DIAGNOSIS — R059 Cough, unspecified: Secondary | ICD-10-CM | POA: Diagnosis not present

## 2022-07-21 LAB — COMPREHENSIVE METABOLIC PANEL
ALT: 14 U/L (ref 0–44)
AST: 16 U/L (ref 15–41)
Albumin: 3.9 g/dL (ref 3.5–5.0)
Alkaline Phosphatase: 62 U/L (ref 38–126)
Anion gap: 10 (ref 5–15)
BUN: 19 mg/dL (ref 8–23)
CO2: 19 mmol/L — ABNORMAL LOW (ref 22–32)
Calcium: 9 mg/dL (ref 8.9–10.3)
Chloride: 110 mmol/L (ref 98–111)
Creatinine, Ser: 0.85 mg/dL (ref 0.44–1.00)
GFR, Estimated: >60 mL/min (ref >60)
Glucose, Bld: 92 mg/dL (ref 70–99)
Potassium: 4.1 mmol/L (ref 3.5–5.1)
Sodium: 139 mmol/L (ref 135–145)
Total Bilirubin: 0.5 mg/dL (ref 0.3–1.2)
Total Protein: 7.3 g/dL (ref 6.5–8.1)

## 2022-07-21 LAB — CBC WITH DIFFERENTIAL/PLATELET
Abs Immature Granulocytes: 0.02 10*3/uL (ref 0.00–0.07)
Basophils Absolute: 0 10*3/uL (ref 0.0–0.1)
Basophils Relative: 0 %
Eosinophils Absolute: 0.1 10*3/uL (ref 0.0–0.5)
Eosinophils Relative: 2 %
HCT: 45.4 % (ref 36.0–46.0)
Hemoglobin: 14.3 g/dL (ref 12.0–15.0)
Immature Granulocytes: 0 %
Lymphocytes Relative: 31 %
Lymphs Abs: 2.4 10*3/uL (ref 0.7–4.0)
MCH: 27.3 pg (ref 26.0–34.0)
MCHC: 31.5 g/dL (ref 30.0–36.0)
MCV: 86.8 fL (ref 80.0–100.0)
Monocytes Absolute: 0.6 10*3/uL (ref 0.1–1.0)
Monocytes Relative: 8 %
Neutro Abs: 4.6 10*3/uL (ref 1.7–7.7)
Neutrophils Relative %: 59 %
Platelets: 269 10*3/uL (ref 150–400)
RBC: 5.23 MIL/uL — ABNORMAL HIGH (ref 3.87–5.11)
RDW: 15.4 % (ref 11.5–15.5)
WBC: 7.8 10*3/uL (ref 4.0–10.5)
nRBC: 0 % (ref 0.0–0.2)

## 2022-07-21 LAB — RESP PANEL BY RT-PCR (RSV, FLU A&B, COVID)  RVPGX2
Influenza A by PCR: NEGATIVE
Influenza B by PCR: NEGATIVE
Resp Syncytial Virus by PCR: NEGATIVE
SARS Coronavirus 2 by RT PCR: NEGATIVE

## 2022-07-21 NOTE — ED Provider Triage Note (Signed)
Emergency Medicine Provider Triage Evaluation Note  Zayonna Ayuso , a 72 y.o. female  was evaluated in triage.  Pt complains of generalized sharp pain throughout the body for the past few days. History of chronic pain and is out of her lyrica and pain medication. Also complains of vertigo that has been ongoing for the past 10-15 years.  Physical Exam  BP (!) 154/103 (BP Location: Left Arm)   Pulse 69   Temp 97.7 F (36.5 C) (Oral)   Resp 20   Ht 5\' 2"  (1.575 m)   Wt 72.6 kg   SpO2 93%   BMI 29.26 kg/m  Gen:   Awake, no distress   Resp:  Normal effort  MSK:   Moves extremities without difficulty  Other:    Medical Decision Making  Medically screening exam initiated at 8:43 PM.  Appropriate orders placed.  Cashlynn Munley was informed that the remainder of the evaluation will be completed by another provider, this initial triage assessment does not replace that evaluation, and the importance of remaining in the ED until their evaluation is complete.    Victorino Dike, FNP 07/21/22 2045

## 2022-07-21 NOTE — ED Triage Notes (Signed)
Pt presents to ER from home with c/o generalized body pain for last several days.  Per ems, pt takes pain meds for chronic pain, but has recently run out of them.  Pt A&O x4 with ems, in NAD.    EMS VS: BP 165/95, 95% RA, HR 85.  Pt afebrile.

## 2022-07-21 NOTE — ED Triage Notes (Signed)
Patient arrived via EMS. Reports she has chronic pain and ran out of Lyrica and Vicodin 3-4 days ago. Reports shaking with walking and "hard and sharp" generalized pain. Denies chest pain or shortness of breath, denies abdominal pain, denies nausea, vomiting or diarrhea. Drowsy, oriented x 4. Resp even, unlabored on RA.

## 2022-07-22 ENCOUNTER — Emergency Department
Admission: EM | Admit: 2022-07-22 | Discharge: 2022-07-22 | Disposition: A | Discharge status: Home / Self Care | Payer: Medicare HMO | Attending: Emergency Medicine | Admitting: Emergency Medicine

## 2022-07-22 DIAGNOSIS — G894 Chronic pain syndrome: Secondary | ICD-10-CM

## 2022-07-22 MED ORDER — OXYCODONE HCL 5 MG PO TABS
5.0000 mg | ORAL_TABLET | Freq: Once | ORAL | Status: AC
  Administered 2022-07-22: 5 mg via ORAL
  Filled 2022-07-22: qty 1

## 2022-07-22 MED ORDER — ACETAMINOPHEN 500 MG PO TABS
1000.0000 mg | ORAL_TABLET | Freq: Once | ORAL | Status: AC
  Administered 2022-07-22: 1000 mg via ORAL
  Filled 2022-07-22: qty 2

## 2022-07-22 MED ORDER — PREGABALIN 50 MG PO CAPS
50.0000 mg | ORAL_CAPSULE | Freq: Once | ORAL | Status: AC
  Administered 2022-07-22: 50 mg via ORAL
  Filled 2022-07-22: qty 1

## 2022-07-22 NOTE — ED Notes (Signed)
Pt Dc to home. DC instructions reviewed with all questions answered. Pt awaiting transport with sister

## 2022-07-22 NOTE — ED Provider Notes (Signed)
[facility] Provider Note    Event Date/Time   First MD Initiated Contact with Patient 07/22/22 0120     (approximate)   History   Generalized Body Aches and Generalized Pain   HPI  Kari Hughes is a 72 y.o. female who presents to the ED for evaluation of Generalized Body Aches and Generalized Pain   I reviewed PDMP which she filled 90 tablets of both Lyrica and Norco on 10/18.  Prescribed by her pain physician.   Patient presents to the ED for evaluation of acute on chronic pain.  She reports that she ran out of her medications early because she has been taking extra because of her chronic pain.  She has an appointment with her pain physician later this week.  She is requesting something to "help her get through."  Denies any fevers, trauma, syncope, injuries or falls.   Physical Exam   Triage Vital Signs: ED Triage Vitals  Enc Vitals Group     BP 07/21/22 2029 (!) 154/103     Pulse Rate 07/21/22 2029 69     Resp 07/21/22 2029 20     Temp 07/21/22 2029 97.7 F (36.5 C)     Temp Source 07/21/22 2029 Oral     SpO2 07/21/22 2029 93 %     Weight 07/21/22 2029 160 lb (72.6 kg)     Height 07/21/22 2029 5\' 2"  (1.575 m)     Head Circumference --      Peak Flow --      Pain Score 07/21/22 2044 10     Pain Loc --      Pain Edu? --      Excl. in [place]? --     Most recent vital signs: Vitals:   07/21/22 2029  BP: (!) 154/103  Pulse: 69  Resp: 20  Temp: 97.7 F (36.5 C)  SpO2: 93%    General: Awake, no distress.  Resting comfortably when I enter the room and awakens to vocal stimulation.  Conversational. CV:  Good peripheral perfusion.  Resp:  Normal effort.  Abd:  No distention.  MSK:  No deformity noted.  Neuro:  No focal deficits appreciated. Other:     ED Results / Procedures / Treatments   Labs (all labs ordered are listed, but only abnormal results are displayed) Labs Reviewed  COMPREHENSIVE METABOLIC PANEL - Abnormal; Notable  for the following components:      Result Value   CO2 19 (*)    All other components within normal limits  CBC WITH DIFFERENTIAL/PLATELET - Abnormal; Notable for the following components:   RBC 5.23 (*)    All other components within normal limits  RESP PANEL BY RT-PCR (RSV, FLU A&B, COVID)  RVPGX2    EKG   RADIOLOGY   Official radiology report(s): No results found.  PROCEDURES and INTERVENTIONS:  Procedures  Medications  pregabalin (LYRICA) capsule 50 mg (has no administration in time range)  oxyCODONE (Oxy IR/ROXICODONE) immediate release tablet 5 mg (has no administration in time range)  acetaminophen (TYLENOL) tablet 1,000 mg (has no administration in time range)     IMPRESSION / MDM / ASSESSMENT AND PLAN / ED COURSE  I reviewed the triage vital signs and the nursing notes.  Differential diagnosis includes, but is not limited to, chronic pain, malingering, sepsis  {Patient presents with symptoms of an acute illness or injury that is potentially life-threatening.  72 year old woman presents with acute on chronic pain, likely due to  her underlying chronic pain syndrome and running out of her chronic analgesia.  She look systemically well with reassuring vital signs and screening labs.  No leukocytosis or signs of sepsis, no significant metabolic derangements and testing negative on her viral respiratory swab that was done in triage.  Will provide a single dose of her typical medications here in the ED and urged her to follow-up with her pain physician closely.  We discussed appropriate use of the ED and return precautions.      FINAL CLINICAL IMPRESSION(S) / ED DIAGNOSES   Final diagnoses:  Chronic pain syndrome     Rx / DC Orders   ED Discharge Orders     None        Note:  This document was prepared using Dragon voice recognition software and may include unintentional dictation errors.   Delton Prairie, MD 07/22/22 [phone]

## 2022-07-24 ENCOUNTER — Telehealth: Payer: Self-pay | Admitting: Student in an Organized Health Care Education/Training Program

## 2022-07-24 ENCOUNTER — Ambulatory Visit
Payer: Medicare HMO | Attending: Student in an Organized Health Care Education/Training Program | Admitting: Student in an Organized Health Care Education/Training Program

## 2022-07-24 ENCOUNTER — Encounter: Payer: Self-pay | Admitting: Student in an Organized Health Care Education/Training Program

## 2022-07-24 VITALS — BP 173/90 | HR 90 | Temp 97.3°F | Ht 64.0 in | Wt 161.0 lb

## 2022-07-24 DIAGNOSIS — R110 Nausea: Secondary | ICD-10-CM | POA: Diagnosis present

## 2022-07-24 DIAGNOSIS — M47816 Spondylosis without myelopathy or radiculopathy, lumbar region: Secondary | ICD-10-CM | POA: Diagnosis present

## 2022-07-24 DIAGNOSIS — M5416 Radiculopathy, lumbar region: Secondary | ICD-10-CM

## 2022-07-24 DIAGNOSIS — M5441 Lumbago with sciatica, right side: Secondary | ICD-10-CM

## 2022-07-24 DIAGNOSIS — G894 Chronic pain syndrome: Secondary | ICD-10-CM | POA: Diagnosis present

## 2022-07-24 DIAGNOSIS — G8929 Other chronic pain: Secondary | ICD-10-CM | POA: Diagnosis present

## 2022-07-24 DIAGNOSIS — M5442 Lumbago with sciatica, left side: Secondary | ICD-10-CM | POA: Diagnosis present

## 2022-07-24 MED ORDER — HYDROCODONE-ACETAMINOPHEN 5-325 MG PO TABS: 1.0000 | ORAL_TABLET | Freq: Three times a day (TID) | ORAL | 0 refills | Status: AC | PRN

## 2022-07-24 MED ORDER — PREGABALIN 25 MG PO CAPS: 50.0000 mg | ORAL_CAPSULE | Freq: Three times a day (TID) | ORAL | 5 refills | Status: DC

## 2022-07-24 MED ORDER — MAGNESIUM 500 MG PO CAPS: 500.0000 mg | ORAL_CAPSULE | Freq: Every day | ORAL | 0 refills | Status: AC

## 2022-07-24 NOTE — Progress Notes (Signed)
PROVIDER NOTE: Information contained herein reflects review and annotations entered in association with encounter. Interpretation of such information and data should be left to medically-trained personnel. Information provided to patient can be located elsewhere in the medical record under "Patient Instructions". Document created using STT-dictation technology, any transcriptional errors that may result from process are unintentional.    Patient: Kari Hughes  Service Category: E/M  Provider: Gillis Santa, MD  DOB: 03/24/1951  DOS: 07/24/2022  Referring Provider: Gladstone Lighter, MD  MRN: [id number]  Specialty: Interventional Pain Management  PCP: Gladstone Lighter, MD  Type: Established Patient  Setting: Ambulatory outpatient    Location: Office  Delivery: Face-to-face     HPI  [name], a 72 y.o. year old female, is here today because of her Chronic radicular lumbar pain [M54.16, G89.29]. [name]'s primary complain today is low back pain and bilateral feet burning/ tingling. Last encounter: My last encounter with her was on 04/30/22 Pertinent problems: [name] has Fibromyalgia; Chronic pain syndrome; Bipolar disorder ([place]); Nerve pain; Lumbar degenerative disc disease; Lumbar facet arthropathy; and Spinal stenosis, lumbar region, with neurogenic claudication on their pertinent problem list. Pain Assessment: Severity of Chronic pain is reported as a 10-Worst pain ever/10. Location: Generalized Right, Left, Upper, Mid, Lower/pain radiaities everywhere. Onset: More than a month ago. Quality: Aching, Burning, Sharp, Stabbing, Throbbing, Constant, Pressure, Nagging, Heaviness. Timing: Constant. Modifying factor(s): pain cream and meds, heating pad. Vitals:  height is 5\' 4"  (1.626 m) and weight is 161 lb (73 kg). Her temperature is 97.3 F (36.3 C) (abnormal). Her blood pressure is 173/90 (abnormal) and her pulse is 90. Her oxygen saturation is 99%.   Reason for encounter: medication  management.  Overall pain exacerbation. Increased axial low back pain as well as bilateral foot pain. Has had some falls since her last visit with me, believes it's secondary to vertigo Ambulates with walker. Patient is wondering if she can increase her Lyrica as it does help out with paresthesias of bilateral feet.  I cautioned her on cognitive risk and fall risk with higher doses of Lyrica but states that we can trial at 50 mg 3 times a day.  She is also endorsing increased lumbar paraspinal muscle cramps which I recommended magnesium nightly for  Pharmacotherapy Assessment  Analgesic: Hydrocodone 5 mg every 8 hours as needed, quantity 90/month; MME equals 15    Monitoring: [place] PMP: PDMP reviewed during this encounter.       Pharmacotherapy: No side-effects or adverse reactions reported. Compliance: No problems identified. Effectiveness: Clinically acceptable.  Chauncey Fischer, RN  07/24/2022 11:52 AM  Sign when Signing Visit Nursing Pain Medication Assessment:  Safety precautions to be maintained throughout the outpatient stay will include: orient to surroundings, keep bed in low position, maintain call bell within reach at all times, provide assistance with transfer out of bed and ambulation.  Medication Inspection Compliance: Pill count conducted under aseptic conditions, in front of the patient. Neither the pills nor the bottle was removed from the patient's sight at any time. Once count was completed pills were immediately returned to the patient in their original bottle.  Medication: Hydrocodone/APAP Pill/Patch Count:  1 of 90 pills remain Pill/Patch Appearance: Markings consistent with prescribed medication Bottle Appearance: Standard pharmacy container. Clearly labeled. Filled Date: 57 / 6 / 2023 Last Medication intake:  TodaySafety precautions to be maintained throughout the outpatient stay will include: orient to surroundings, keep bed in low position, maintain call bell within  reach at all  times, provide assistance with transfer out of bed and ambulation.     UDS:  Summary  Date Value Ref Range Status  10/09/2020 Note  Final    Comment:    ==================================================================== Compliance Drug Analysis, Ur ==================================================================== Test                             Result       Flag       Units  Drug Present and Declared for Prescription Verification   Tramadol                       >3425        EXPECTED   ng/mg creat   O-Desmethyltramadol            2040         EXPECTED   ng/mg creat   N-Desmethyltramadol            1410         EXPECTED   ng/mg creat    Source of tramadol is a prescription medication. O-desmethyltramadol    and N-desmethyltramadol are expected metabolites of tramadol.    Gabapentin                     PRESENT      EXPECTED   Methocarbamol                  PRESENT      EXPECTED   Guaifenesin                    PRESENT      EXPECTED    Guaifenesin may be administered as an over-the-counter or    prescription drug; it may also be present as a breakdown product of    methocarbamol.    Diphenhydramine                PRESENT      EXPECTED  Drug Present not Declared for Prescription Verification   Acetaminophen                  PRESENT      UNEXPECTED  Drug Absent but Declared for Prescription Verification   Duloxetine                     Not Detected UNEXPECTED ==================================================================== Test                      Result    Flag   Units      Ref Range   Creatinine              146              mg/dL      >=20 ==================================================================== Declared Medications:  The flagging and interpretation on this report are based on the  following declared medications.  Unexpected results may arise from  inaccuracies in the declared medications.   **Note: The testing scope of this panel includes  these medications:   Diphenhydramine (Benadryl)  Duloxetine (Cymbalta)  Gabapentin (Neurontin)  Methocarbamol (Robaxin)  Tramadol (Ultram)   **Note: The testing scope of this panel does not include the  following reported medications:   Amlodipine (Norvasc)  Ezetimibe (Zetia)  Loperamide (Imodium)  Ticagrelor (Brilinta) ==================================================================== For clinical consultation, please call [phone]. ====================================================================      ROS  Constitutional: Denies any fever or chills Gastrointestinal: No reported hemesis, hematochezia, vomiting, or acute GI distress Musculoskeletal:  Low back pain and muscle spasms, bilateral leg pain and weakness Neurological: No reported episodes of acute onset apraxia, aphasia, dysarthria, agnosia, amnesia, paralysis, loss of coordination, or loss of consciousness  Medication Review  FLUoxetine, HYDROcodone-acetaminophen, Magnesium, acetaminophen, amLODipine, clopidogrel, pregabalin, and traZODone  History Review  Allergy: Ms. Croslin has No Known Allergies. Drug: Ms. Manni  reports that she does not currently use drugs after having used the following drugs: Marijuana. Alcohol:  reports current alcohol use of about 1.0 standard drink of alcohol per week. Tobacco:  reports that she has been smoking cigarettes and e-cigarettes. She has never used smokeless tobacco. Social: Ms. Mccolgan  reports that she has been smoking cigarettes and e-cigarettes. She has never used smokeless tobacco. She reports current alcohol use of about 1.0 standard drink of alcohol per week. She reports that she does not currently use drugs after having used the following drugs: Marijuana. Medical:  has a past medical history of Asthma, Depression, Hypertension, Hypertension (2002), Spinal stenosis (2012), and Vertigo. Surgical: Ms. Rehberg  has a past surgical history that includes pacemaker  placement (2019) and Joint replacement (Left, 2013). Family: family history includes Alcohol abuse in her brother and brother; Drug abuse in her brother and brother.  Laboratory Chemistry Profile   Renal Lab Results  Component Value Date   BUN 19 07/21/2022   CREATININE 0.85 07/21/2022   BCR 21 05/08/2020   GFRAA 73 05/08/2020   GFRNONAA >60 07/21/2022    Hepatic Lab Results  Component Value Date   AST 16 07/21/2022   ALT 14 07/21/2022   ALBUMIN 3.9 07/21/2022   ALKPHOS 62 07/21/2022    Electrolytes Lab Results  Component Value Date   NA 139 07/21/2022   K 4.1 07/21/2022   CL 110 07/21/2022   CALCIUM 9.0 07/21/2022    Bone No results found for: "VD25OH", "VD125OH2TOT", "VQ2595GL8", "VF6433IR5", "25OHVITD1", "25OHVITD2", "25OHVITD3", "TESTOFREE", "TESTOSTERONE"  Inflammation (CRP: Acute Phase) (ESR: Chronic Phase) No results found for: "CRP", "ESRSEDRATE", "LATICACIDVEN"       Note: Above Lab results reviewed.  Physical Exam  General appearance: Well nourished, well developed, and well hydrated. In no apparent acute distress Mental status: Alert, oriented x 3 (person, place, & time)       Respiratory: No evidence of acute respiratory distress Eyes: PERLA Vitals: BP (!) 173/90   Pulse 90   Temp (!) 97.3 F (36.3 C)   Ht 5\' 4"  (1.626 m)   Wt 161 lb (73 kg)   SpO2 99%   BMI 27.64 kg/m  BMI: Estimated body mass index is 27.64 kg/m as calculated from the following:   Height as of this encounter: 5\' 4"  (1.626 m).   Weight as of this encounter: 161 lb (73 kg). Ideal: Ideal body weight: 54.7 kg (120 lb 9.5 oz) Adjusted ideal body weight: 62 kg (136 lb 12.1 oz)  Lumbar Exam  Skin & Axial Inspection: No masses, redness, or swelling Alignment: Asymmetric Functional ROM: Pain restricted ROM affecting both sides Stability: No instability detected Muscle Tone/Strength: Functionally intact. No obvious neuro-muscular anomalies detected. Sensory (Neurological):  Musculoskeletal pain pattern and dermatomal   Gait & Posture Assessment  Ambulation: Patient came in today in a wheel chair Gait: Significantly limited. Dependent on assistive device to ambulate Posture: Difficulty standing up straight, due to pain    Lower Extremity Exam      Side: Right lower extremity  Side: Left lower extremity  Stability: No instability observed           Stability: No instability observed          Skin & Extremity Inspection: Skin color, temperature, and hair growth are WNL. No peripheral edema or cyanosis. No masses, redness, swelling, asymmetry, or associated skin lesions. No contractures.   Skin & Extremity Inspection: Skin color, temperature, and hair growth are WNL. No peripheral edema or cyanosis. No masses, redness, swelling, asymmetry, or associated skin lesions. No contractures.  Functional ROM: Pain restricted ROM for hip and knee joints           Functional ROM: Pain restricted ROM for hip and knee joints          Muscle Tone/Strength: Functionally intact. No obvious neuro-muscular anomalies detected.   Muscle Tone/Strength: Functionally intact. No obvious neuro-muscular anomalies detected.  Sensory (Neurological): Neurogenic         Sensory (Neurological): Neurogenic        DTR: Patellar: deferred today Achilles: deferred today Plantar: deferred today   DTR: Patellar: deferred today Achilles: deferred today Plantar: deferred today  Palpation: No palpable anomalies   Palpation: No palpable anomalies    Assessment   Diagnosis Status  1. Chronic radicular lumbar pain   2. Lumbar radiculopathy   3. Chronic bilateral low back pain with bilateral sciatica   4. Lumbar facet arthropathy   5. Nausea without vomiting   6. Chronic pain syndrome     Controlled Controlled Controlled   Plan of Care   Ms. Lodie Gayheart has a current medication list which includes the following long-term medication(s): amlodipine, fluoxetine, trazodone, and  pregabalin.  Pharmacotherapy (Medications Ordered): Meds ordered this encounter  Medications   pregabalin (LYRICA) 25 MG capsule    Sig: Take 2 capsules (50 mg total) by mouth every 8 (eight) hours.    Dispense:  90 capsule    Refill:  5   HYDROcodone-acetaminophen (NORCO/VICODIN) 5-325 MG tablet    Sig: Take 1 tablet by mouth every 8 (eight) hours as needed for moderate pain.    Dispense:  90 tablet    Refill:  0   HYDROcodone-acetaminophen (NORCO/VICODIN) 5-325 MG tablet    Sig: Take 1 tablet by mouth every 8 (eight) hours as needed for moderate pain.    Dispense:  90 tablet    Refill:  0   HYDROcodone-acetaminophen (NORCO/VICODIN) 5-325 MG tablet    Sig: Take 1 tablet by mouth every 8 (eight) hours as needed for moderate pain.    Dispense:  90 tablet    Refill:  0   Magnesium 500 MG CAPS    Sig: Take 1 capsule (500 mg total) by mouth at bedtime.    Dispense:  180 capsule    Refill:  0    Fill one day early if pharmacy is closed on scheduled refill date. May substitute for generic, or similar, if available.     Orders:  Orders Placed This Encounter  Procedures   ToxASSURE Select 13 (MW), Urine    Volume: 30 ml(s). Minimum 3 ml of urine is needed. Document temperature of fresh sample. Indications: Long term (current) use of opiate analgesic [phone])    Order Specific Question:   Release to patient    Answer:   Immediate   Follow-up plan:   Return in about 3 months (around 10/23/2022) for Medication Management, in person.    Recent Visits Date Type Provider Dept  04/30/22 Office Visit Edward Jolly,  MD [facility]  Showing recent visits within past 90 days and meeting all other requirements Today's Visits Date Type Provider Dept  07/24/22 Office Visit Edward Jolly, MD [facility]  Showing today's visits and meeting all other requirements Future Appointments Date Type Provider Dept  10/21/22 Appointment Edward Jolly, MD [facility]   Showing future appointments within next 90 days and meeting all other requirements  I discussed the assessment and treatment plan with the patient. The patient was provided an opportunity to ask questions and all were answered. The patient agreed with the plan and demonstrated an understanding of the instructions.  Patient advised to call back or seek an in-person evaluation if the symptoms or condition worsens.  Duration of encounter: .  Total time on encounter, as per AMA guidelines included both the face-to-face and non-face-to-face time personally spent by the physician and/or other qualified health care professional(s) on the day of the encounter (includes time in activities that require the physician or other qualified health care professional and does not include time in activities normally performed by clinical staff). Physician's time may include the following activities when performed: preparing to see the patient (eg, review of tests, pre-charting review of records) obtaining and/or reviewing separately obtained history performing a medically appropriate examination and/or evaluation counseling and educating the patient/family/caregiver ordering medications, tests, or procedures referring and communicating with other health care professionals (when not separately reported) documenting clinical information in the electronic or other health record independently interpreting results (not separately reported) and communicating results to the patient/ family/caregiver care coordination (not separately reported)  Note by: Edward Jolly, MD Date: 07/24/2022; Time: 1:48 PM

## 2022-07-24 NOTE — Telephone Encounter (Signed)
PT stated that pharmacy needs a PA so patient can pick up prescription. Please give patient a call. Thanks

## 2022-07-24 NOTE — Progress Notes (Signed)
Nursing Pain Medication Assessment:  Safety precautions to be maintained throughout the outpatient stay will include: orient to surroundings, keep bed in low position, maintain call bell within reach at all times, provide assistance with transfer out of bed and ambulation.  Medication Inspection Compliance: Pill count conducted under aseptic conditions, in front of the patient. Neither the pills nor the bottle was removed from the patient's sight at any time. Once count was completed pills were immediately returned to the patient in their original bottle.  Medication: Hydrocodone/APAP Pill/Patch Count:  1 of 90 pills remain Pill/Patch Appearance: Markings consistent with prescribed medication Bottle Appearance: Standard pharmacy container. Clearly labeled. Filled Date: 56 / 6 / 2023 Last Medication intake:  TodaySafety precautions to be maintained throughout the outpatient stay will include: orient to surroundings, keep bed in low position, maintain call bell within reach at all times, provide assistance with transfer out of bed and ambulation.

## 2022-07-28 ENCOUNTER — Telehealth: Payer: Self-pay | Admitting: Student in an Organized Health Care Education/Training Program

## 2022-07-28 ENCOUNTER — Other Ambulatory Visit: Payer: Self-pay | Admitting: *Deleted

## 2022-07-28 DIAGNOSIS — M47816 Spondylosis without myelopathy or radiculopathy, lumbar region: Secondary | ICD-10-CM

## 2022-07-28 DIAGNOSIS — G894 Chronic pain syndrome: Secondary | ICD-10-CM

## 2022-07-28 DIAGNOSIS — M5416 Radiculopathy, lumbar region: Secondary | ICD-10-CM

## 2022-07-28 DIAGNOSIS — G8929 Other chronic pain: Secondary | ICD-10-CM

## 2022-07-28 MED ORDER — PREGABALIN 25 MG PO CAPS: 50.0000 mg | ORAL_CAPSULE | Freq: Three times a day (TID) | ORAL | 5 refills | Status: DC

## 2022-07-28 NOTE — Telephone Encounter (Signed)
Rx request sent to Dr. Lateef 

## 2022-07-28 NOTE — Telephone Encounter (Signed)
Patient has lost Lyrica and wants to know what to do?

## 2022-07-30 ENCOUNTER — Telehealth: Payer: Self-pay | Admitting: *Deleted

## 2022-07-30 NOTE — Telephone Encounter (Signed)
Contacted patient on 07-28-21, instructed her to return to the office for a serum drug screen. She agreed to do so as soon as possible. She has not done so as of today (07-30-21). Spoke with her again, instructed her to return to the office asap for serum drug screen. States she doesn't have transportation to get here. I informed her that she possibly will not receive further prescriptions for opioids without the SDS.

## 2022-07-31 ENCOUNTER — Telehealth: Payer: Self-pay

## 2022-07-31 NOTE — Telephone Encounter (Signed)
The patient needs an appointment to have her device reset for her remote monitor to work. [employer] needs to be present. I told the patient to bring everything to the office and the rep would help her. I also told her that the scheduler would call her to let schedule her an appointment with [facility] office.

## 2022-08-01 NOTE — Telephone Encounter (Signed)
It does not matter. She can only go to the [place] office. I asked her if she can come to [facility] and she said no.

## 2022-08-19 NOTE — Progress Notes (Signed)
Cough and symptoms concerning for viral illness including COVID/influenza

## 2022-08-19 NOTE — Progress Notes (Deleted)
Electrophysiology Office Follow up Visit Note:    Date:  08/19/2022   ID:  Kari Hughes, DOB [birth year], MRN [id number]  PCP:  Gladstone Lighter, MD  [facility] Cardiologist:  None  [facility] Electrophysiologist:  Vickie Epley, MD    Interval History:    Kari Hughes is a 73 y.o. female who presents for a follow up visit. They were last seen in clinic May 14, 2022.  At that appointment her pacemaker was functioning appropriately.  She presents today for follow-up.     Past Medical History:  Diagnosis Date   Asthma    Depression    Hypertension    Hypertension 2002   Spinal stenosis 2012   Vertigo     Past Surgical History:  Procedure Laterality Date   JOINT REPLACEMENT Left 2013   PACEMAKER PLACEMENT  2019    Current Medications: No outpatient medications have been marked as taking for the 08/20/22 encounter (Appointment) with Vickie Epley, MD.     Allergies:   Patient has no known allergies.   Social History   Socioeconomic History   Marital status: Single    Spouse name: Not on file   Number of children: 3   Years of education: Not on file   Highest education level: Not on file  Occupational History   Not on file  Tobacco Use   Smoking status: Every Day    Years: 10.00    Types: Cigarettes, E-cigarettes   Smokeless tobacco: Never   Tobacco comments:    5 cigarettes per day  Vaping Use   Vaping Use: Never used  Substance and Sexual Activity   Alcohol use: Yes    Alcohol/week: 1.0 standard drink of alcohol    Types: 1 Glasses of wine per week    Comment: occasional   Drug use: Not Currently    Types: Marijuana    Comment: stopped 6 months ago   Sexual activity: Not Currently  Other Topics Concern   Not on file  Social History Narrative   Not on file   Social Determinants of Health   Financial Resource Strain: Not on file  Food Insecurity: Not on file  Transportation Needs: Not on file  Physical Activity: Not on  file  Stress: Not on file  Social Connections: Not on file     Family History: The patient's family history includes Alcohol abuse in her brother and brother; Drug abuse in her brother and brother.  ROS:   Please see the history of present illness.    All other systems reviewed and are negative.  EKGs/Labs/Other Studies Reviewed:    The following studies were reviewed today:  August 20, 2022 in clinic device interrogation personally reviewed ***    Recent Labs: 07/21/2022: ALT 14; BUN 19; Creatinine, Ser 0.85; Hemoglobin 14.3; Platelets 269; Potassium 4.1; Sodium 139  Recent Lipid Panel    Component Value Date/Time   CHOL 225 (H) 05/08/2020 1430   TRIG 174 (H) 05/08/2020 1430   HDL 86 05/08/2020 1430   LDLCALC 110 (H) 05/08/2020 1430    Physical Exam:    VS:  There were no vitals taken for this visit.    Wt Readings from Last 3 Encounters:  07/24/22 161 lb (73 kg)  07/21/22 160 lb (72.6 kg)  05/14/22 163 lb (73.9 kg)     GEN: *** Well nourished, well developed in no acute distress CARDIAC: ***RRR, no murmurs, rubs, gallops.  Pacemaker pocket well-healed.  ASSESSMENT:    1. Sick sinus syndrome ([place])   2. Pacemaker   3. Primary hypertension    PLAN:    In order of problems listed above:  #Sick sinus syndrome #Permanent pacemaker in situ Device functioning appropriately. Set up remote monitoring  #Hypertension *** goal today.  Recommend checking blood pressures 1-2 times per week at home and recording the values.  Recommend bringing these recordings to the primary care physician.   Follow-up 1 year with APP.    Medication Adjustments/Labs and Tests Ordered: Current medicines are reviewed at length with the patient today.  Concerns regarding medicines are outlined above.  No orders of the defined types were placed in this encounter.  No orders of the defined types were placed in this encounter.    Signed, Lars Mage, MD, [facility],  [facility] 08/19/2022 10:20 AM    Electrophysiology [facility]

## 2022-08-20 ENCOUNTER — Ambulatory Visit: Payer: Medicare HMO | Admitting: Cardiology

## 2022-08-20 DIAGNOSIS — I495 Sick sinus syndrome: Secondary | ICD-10-CM

## 2022-08-20 DIAGNOSIS — I10 Essential (primary) hypertension: Secondary | ICD-10-CM

## 2022-08-20 DIAGNOSIS — Z950 Presence of cardiac pacemaker: Secondary | ICD-10-CM

## 2022-09-09 NOTE — Progress Notes (Unsigned)
  Electrophysiology Office Follow up Visit Note:    Date:  09/10/2022   ID:  Kari Hughes, DOB [birth year], MRN [id number]  PCP:  Gladstone Lighter, MD  [facility] Cardiologist:  None  [facility] Electrophysiologist:  Vickie Epley, MD    Interval History:    Kari Hughes is a 71 y.o. female who presents for a follow up visit.   Last seen 05/14/2022 for SSS s/p PPM.  She has been doing well.  No complaints related to her pacemaker.  She has been out of her medications for 3 days because she did not have a ride and so has not been able to take her antihypertensives.      Past medical, surgical, social and family history were reviewed.  ROS:   Please see the history of present illness.    All other systems reviewed and are negative.  EKGs/Labs/Other Studies Reviewed:    The following studies were reviewed today:  09/10/2022 in clinic device interrogation personally reviewed Battery longevity 4 years Lead parameter stable No changes made today    Physical Exam:    VS:  There were no vitals taken for this visit.    Wt Readings from Last 3 Encounters:  07/24/22 161 lb (73 kg)  07/21/22 160 lb (72.6 kg)  05/14/22 163 lb (73.9 kg)     GEN:  Well nourished, well developed in no acute distress CARDIAC: RRR, no murmurs, rubs, gallops.  Prepectoral pocket healing well. RESPIRATORY:  Clear to auscultation without rales, wheezing or rhonchi       ASSESSMENT:    1. Sick sinus syndrome ([place])   2. Pacemaker   3. Primary hypertension    PLAN:    In order of problems listed above:   #SSS s/p PPM  Device functioning appropriately. Continue remote monitoring.  #Hypertension Above goal today.  Secondary to her being out of her medications for the last 3 days.  She is going to the drugstore after her visit with me today to restart the drugs.  Recommend checking blood pressures 1-2 times per week at home and recording the values.  Recommend bringing these  recordings to the primary care physician.   F/u 1 year with APP.     Signed, Lars Mage, MD, [facility], [facility] 09/10/2022 9:28 AM    Electrophysiology [facility]

## 2022-09-10 ENCOUNTER — Encounter: Payer: Self-pay | Admitting: Cardiology

## 2022-09-10 ENCOUNTER — Ambulatory Visit: Payer: Medicare HMO | Attending: Cardiology | Admitting: Cardiology

## 2022-09-10 VITALS — BP 160/92 | HR 65 | Ht 61.0 in | Wt 165.4 lb

## 2022-09-10 DIAGNOSIS — Z950 Presence of cardiac pacemaker: Secondary | ICD-10-CM | POA: Diagnosis not present

## 2022-09-10 DIAGNOSIS — I495 Sick sinus syndrome: Secondary | ICD-10-CM

## 2022-09-10 DIAGNOSIS — I10 Essential (primary) hypertension: Secondary | ICD-10-CM | POA: Diagnosis not present

## 2022-09-10 LAB — PACEMAKER DEVICE OBSERVATION

## 2022-09-10 NOTE — Patient Instructions (Signed)
Medication Instructions:  Your physician recommends that you continue on your current medications as directed. Please refer to the Current Medication list given to you today.  *If you need a refill on your cardiac medications before your next appointment, please call your pharmacy*  Follow-Up: At [facility], you and your health needs are our priority.  As part of our continuing mission to provide you with exceptional heart care, we have created designated Provider Care Teams.  These Care Teams include your primary Cardiologist (physician) and Advanced Practice Providers (APPs -  Physician Assistants and Nurse Practitioners) who all work together to provide you with the care you need, when you need it.  Your next appointment:   1 year(s)  Provider:   You may see Vickie Epley, MD or one of the following Advanced Practice Providers on your designated Care Team:   Tommye Standard, [place] Beams" [place], [place], NP

## 2022-09-14 LAB — TOXASSURE SELECT 13 (MW), URINE

## 2022-09-30 ENCOUNTER — Other Ambulatory Visit: Payer: Self-pay | Admitting: Internal Medicine

## 2022-09-30 DIAGNOSIS — Z1231 Encounter for screening mammogram for malignant neoplasm of breast: Secondary | ICD-10-CM

## 2022-10-21 ENCOUNTER — Ambulatory Visit (HOSPITAL_BASED_OUTPATIENT_CLINIC_OR_DEPARTMENT_OTHER): Payer: Medicare HMO | Admitting: Student in an Organized Health Care Education/Training Program

## 2022-10-21 ENCOUNTER — Encounter: Payer: Self-pay | Admitting: Student in an Organized Health Care Education/Training Program

## 2022-10-21 DIAGNOSIS — G8929 Other chronic pain: Secondary | ICD-10-CM

## 2022-10-21 DIAGNOSIS — M5416 Radiculopathy, lumbar region: Secondary | ICD-10-CM

## 2022-10-21 NOTE — Progress Notes (Signed)
No show

## 2023-04-09 ENCOUNTER — Emergency Department: Payer: Medicare HMO

## 2023-04-09 ENCOUNTER — Other Ambulatory Visit: Payer: Medicare HMO

## 2023-04-09 ENCOUNTER — Other Ambulatory Visit: Payer: Self-pay

## 2023-04-09 ENCOUNTER — Inpatient Hospital Stay
Admission: EM | Admit: 2023-04-09 | Discharge: 2023-04-11 | DRG: 065 | Disposition: A | Discharge status: Home Health | Payer: Medicare HMO | Attending: Internal Medicine | Admitting: Internal Medicine

## 2023-04-09 ENCOUNTER — Encounter: Payer: Self-pay | Admitting: Internal Medicine

## 2023-04-09 DIAGNOSIS — Z950 Presence of cardiac pacemaker: Secondary | ICD-10-CM | POA: Diagnosis not present

## 2023-04-09 DIAGNOSIS — M19041 Primary osteoarthritis, right hand: Secondary | ICD-10-CM | POA: Diagnosis present

## 2023-04-09 DIAGNOSIS — I495 Sick sinus syndrome: Secondary | ICD-10-CM | POA: Diagnosis present

## 2023-04-09 DIAGNOSIS — M25551 Pain in right hip: Secondary | ICD-10-CM | POA: Diagnosis present

## 2023-04-09 DIAGNOSIS — R2689 Other abnormalities of gait and mobility: Secondary | ICD-10-CM | POA: Diagnosis present

## 2023-04-09 DIAGNOSIS — M797 Fibromyalgia: Secondary | ICD-10-CM | POA: Diagnosis present

## 2023-04-09 DIAGNOSIS — E669 Obesity, unspecified: Secondary | ICD-10-CM

## 2023-04-09 DIAGNOSIS — M19042 Primary osteoarthritis, left hand: Secondary | ICD-10-CM | POA: Diagnosis present

## 2023-04-09 DIAGNOSIS — J9811 Atelectasis: Secondary | ICD-10-CM | POA: Diagnosis present

## 2023-04-09 DIAGNOSIS — Z1152 Encounter for screening for COVID-19: Secondary | ICD-10-CM | POA: Diagnosis not present

## 2023-04-09 DIAGNOSIS — Z813 Family history of other psychoactive substance abuse and dependence: Secondary | ICD-10-CM

## 2023-04-09 DIAGNOSIS — H532 Diplopia: Secondary | ICD-10-CM | POA: Diagnosis present

## 2023-04-09 DIAGNOSIS — E559 Vitamin D deficiency, unspecified: Secondary | ICD-10-CM | POA: Diagnosis present

## 2023-04-09 DIAGNOSIS — I10 Essential (primary) hypertension: Secondary | ICD-10-CM | POA: Diagnosis present

## 2023-04-09 DIAGNOSIS — Y92009 Unspecified place in unspecified non-institutional (private) residence as the place of occurrence of the external cause: Secondary | ICD-10-CM

## 2023-04-09 DIAGNOSIS — E785 Hyperlipidemia, unspecified: Secondary | ICD-10-CM | POA: Diagnosis present

## 2023-04-09 DIAGNOSIS — F319 Bipolar disorder, unspecified: Secondary | ICD-10-CM | POA: Diagnosis present

## 2023-04-09 DIAGNOSIS — R29702 NIHSS score 2: Secondary | ICD-10-CM | POA: Diagnosis present

## 2023-04-09 DIAGNOSIS — G709 Myoneural disorder, unspecified: Secondary | ICD-10-CM | POA: Diagnosis present

## 2023-04-09 DIAGNOSIS — M4800 Spinal stenosis, site unspecified: Secondary | ICD-10-CM | POA: Diagnosis present

## 2023-04-09 DIAGNOSIS — Z811 Family history of alcohol abuse and dependence: Secondary | ICD-10-CM | POA: Diagnosis not present

## 2023-04-09 DIAGNOSIS — R296 Repeated falls: Secondary | ICD-10-CM

## 2023-04-09 DIAGNOSIS — I6389 Other cerebral infarction: Secondary | ICD-10-CM | POA: Diagnosis present

## 2023-04-09 DIAGNOSIS — F17290 Nicotine dependence, other tobacco product, uncomplicated: Secondary | ICD-10-CM | POA: Diagnosis present

## 2023-04-09 DIAGNOSIS — M1990 Unspecified osteoarthritis, unspecified site: Secondary | ICD-10-CM | POA: Insufficient documentation

## 2023-04-09 DIAGNOSIS — Z8673 Personal history of transient ischemic attack (TIA), and cerebral infarction without residual deficits: Secondary | ICD-10-CM

## 2023-04-09 DIAGNOSIS — W19XXXA Unspecified fall, initial encounter: Secondary | ICD-10-CM | POA: Diagnosis present

## 2023-04-09 DIAGNOSIS — W19XXXS Unspecified fall, sequela: Secondary | ICD-10-CM | POA: Diagnosis not present

## 2023-04-09 DIAGNOSIS — F17210 Nicotine dependence, cigarettes, uncomplicated: Secondary | ICD-10-CM | POA: Diagnosis present

## 2023-04-09 DIAGNOSIS — Z6830 Body mass index (BMI) 30.0-30.9, adult: Secondary | ICD-10-CM

## 2023-04-09 DIAGNOSIS — I639 Cerebral infarction, unspecified: Secondary | ICD-10-CM | POA: Diagnosis present

## 2023-04-09 DIAGNOSIS — E1151 Type 2 diabetes mellitus with diabetic peripheral angiopathy without gangrene: Secondary | ICD-10-CM | POA: Diagnosis present

## 2023-04-09 DIAGNOSIS — Z79899 Other long term (current) drug therapy: Secondary | ICD-10-CM

## 2023-04-09 DIAGNOSIS — M48062 Spinal stenosis, lumbar region with neurogenic claudication: Secondary | ICD-10-CM | POA: Diagnosis present

## 2023-04-09 DIAGNOSIS — G894 Chronic pain syndrome: Secondary | ICD-10-CM | POA: Diagnosis present

## 2023-04-09 DIAGNOSIS — Z7982 Long term (current) use of aspirin: Secondary | ICD-10-CM

## 2023-04-09 DIAGNOSIS — R471 Dysarthria and anarthria: Secondary | ICD-10-CM | POA: Diagnosis present

## 2023-04-09 DIAGNOSIS — M4807 Spinal stenosis, lumbosacral region: Secondary | ICD-10-CM | POA: Diagnosis not present

## 2023-04-09 DIAGNOSIS — F419 Anxiety disorder, unspecified: Secondary | ICD-10-CM | POA: Diagnosis present

## 2023-04-09 LAB — CBC WITH DIFFERENTIAL/PLATELET
Abs Immature Granulocytes: 0.03 10*3/uL (ref 0.00–0.07)
Basophils Absolute: 0 10*3/uL (ref 0.0–0.1)
Basophils Relative: 0 %
Eosinophils Absolute: 0 10*3/uL (ref 0.0–0.5)
Eosinophils Relative: 0 %
HCT: 46 % (ref 36.0–46.0)
Hemoglobin: 14.6 g/dL (ref 12.0–15.0)
Immature Granulocytes: 0 %
Lymphocytes Relative: 13 %
Lymphs Abs: 1.1 10*3/uL (ref 0.7–4.0)
MCH: 28.1 pg (ref 26.0–34.0)
MCHC: 31.7 g/dL (ref 30.0–36.0)
MCV: 88.5 fL (ref 80.0–100.0)
Monocytes Absolute: 0.6 10*3/uL (ref 0.1–1.0)
Monocytes Relative: 7 %
Neutro Abs: 7.1 10*3/uL (ref 1.7–7.7)
Neutrophils Relative %: 80 %
Platelets: 217 10*3/uL (ref 150–400)
RBC: 5.2 MIL/uL — ABNORMAL HIGH (ref 3.87–5.11)
RDW: 16.5 % — ABNORMAL HIGH (ref 11.5–15.5)
WBC: 8.9 10*3/uL (ref 4.0–10.5)
nRBC: 0 % (ref 0.0–0.2)

## 2023-04-09 LAB — HEMOGLOBIN A1C
Hgb A1c MFr Bld: 6.1 % — ABNORMAL HIGH (ref 4.8–5.6)
Mean Plasma Glucose: 128.37 mg/dL

## 2023-04-09 LAB — COMPREHENSIVE METABOLIC PANEL
ALT: 14 U/L (ref 0–44)
AST: 15 U/L (ref 15–41)
Albumin: 4.2 g/dL (ref 3.5–5.0)
Alkaline Phosphatase: 71 U/L (ref 38–126)
Anion gap: 10 (ref 5–15)
BUN: 18 mg/dL (ref 8–23)
CO2: 22 mmol/L (ref 22–32)
Calcium: 9.9 mg/dL (ref 8.9–10.3)
Chloride: 105 mmol/L (ref 98–111)
Creatinine, Ser: 0.99 mg/dL (ref 0.44–1.00)
GFR, Estimated: >60 mL/min (ref >60)
Glucose, Bld: 81 mg/dL (ref 70–99)
Potassium: 4.5 mmol/L (ref 3.5–5.1)
Sodium: 137 mmol/L (ref 135–145)
Total Bilirubin: 0.4 mg/dL (ref 0.3–1.2)
Total Protein: 7.9 g/dL (ref 6.5–8.1)

## 2023-04-09 LAB — CREATININE, SERUM
Creatinine, Ser: 0.97 mg/dL (ref 0.44–1.00)
GFR, Estimated: >60 mL/min (ref >60)

## 2023-04-09 LAB — CBC
HCT: 41.8 % (ref 36.0–46.0)
Hemoglobin: 13.2 g/dL (ref 12.0–15.0)
MCH: 28.1 pg (ref 26.0–34.0)
MCHC: 31.6 g/dL (ref 30.0–36.0)
MCV: 89.1 fL (ref 80.0–100.0)
Platelets: 201 10*3/uL (ref 150–400)
RBC: 4.69 MIL/uL (ref 3.87–5.11)
RDW: 16.6 % — ABNORMAL HIGH (ref 11.5–15.5)
WBC: 7.9 10*3/uL (ref 4.0–10.5)
nRBC: 0 % (ref 0.0–0.2)

## 2023-04-09 LAB — URINE DRUG SCREEN, QUALITATIVE (ARMC ONLY)
Amphetamines, Ur Screen: NOT DETECTED
Barbiturates, Ur Screen: NOT DETECTED
Benzodiazepine, Ur Scrn: NOT DETECTED
Cannabinoid 50 Ng, Ur ~~LOC~~: POSITIVE — AB
Cocaine Metabolite,Ur ~~LOC~~: NOT DETECTED
MDMA (Ecstasy)Ur Screen: NOT DETECTED
Methadone Scn, Ur: NOT DETECTED
Opiate, Ur Screen: NOT DETECTED
Phencyclidine (PCP) Ur S: NOT DETECTED
Tricyclic, Ur Screen: NOT DETECTED

## 2023-04-09 LAB — URINALYSIS, ROUTINE W REFLEX MICROSCOPIC
Bilirubin Urine: NEGATIVE
Glucose, UA: NEGATIVE mg/dL
Ketones, ur: NEGATIVE mg/dL
Nitrite: NEGATIVE
Protein, ur: NEGATIVE mg/dL
Specific Gravity, Urine: 1.006 (ref 1.005–1.030)
pH: 5 (ref 5.0–8.0)

## 2023-04-09 LAB — RESP PANEL BY RT-PCR (RSV, FLU A&B, COVID)  RVPGX2
Influenza A by PCR: NEGATIVE
Influenza B by PCR: NEGATIVE
Resp Syncytial Virus by PCR: NEGATIVE
SARS Coronavirus 2 by RT PCR: NEGATIVE

## 2023-04-09 LAB — ETHANOL: Alcohol, Ethyl (B): <10 mg/dL (ref <10)

## 2023-04-09 LAB — LIPASE, BLOOD: Lipase: 25 U/L (ref 11–51)

## 2023-04-09 MED ORDER — ATORVASTATIN CALCIUM 20 MG PO TABS
40.0000 mg | ORAL_TABLET | Freq: Every day | ORAL | Status: DC
  Administered 2023-04-09 – 2023-04-11 (×3): 40 mg via ORAL
  Filled 2023-04-09 (×3): qty 2

## 2023-04-09 MED ORDER — ASPIRIN 81 MG PO CHEW
324.0000 mg | CHEWABLE_TABLET | Freq: Once | ORAL | Status: AC
  Administered 2023-04-09: 324 mg via ORAL
  Filled 2023-04-09: qty 4

## 2023-04-09 MED ORDER — ACETAMINOPHEN 325 MG PO TABS: 650.0000 mg | ORAL_TABLET | Freq: Four times a day (QID) | ORAL | Status: DC | PRN

## 2023-04-09 MED ORDER — GABAPENTIN 300 MG PO CAPS
600.0000 mg | ORAL_CAPSULE | Freq: Three times a day (TID) | ORAL | Status: DC
  Administered 2023-04-09 – 2023-04-11 (×6): 600 mg via ORAL
  Filled 2023-04-09 (×7): qty 2

## 2023-04-09 MED ORDER — ALBUTEROL SULFATE (2.5 MG/3ML) 0.083% IN NEBU: 3.0000 mL | INHALATION_SOLUTION | Freq: Four times a day (QID) | RESPIRATORY_TRACT | Status: DC | PRN

## 2023-04-09 MED ORDER — POLYETHYLENE GLYCOL 3350 17 G PO PACK: 17.0000 g | PACK | Freq: Every day | ORAL | Status: DC | PRN

## 2023-04-09 MED ORDER — ACETAMINOPHEN 650 MG RE SUPP: 650.0000 mg | Freq: Four times a day (QID) | RECTAL | Status: DC | PRN

## 2023-04-09 MED ORDER — ENOXAPARIN SODIUM 40 MG/0.4ML IJ SOSY
40.0000 mg | PREFILLED_SYRINGE | INTRAMUSCULAR | Status: DC
  Filled 2023-04-09: qty 0.4

## 2023-04-09 MED ORDER — CLOPIDOGREL BISULFATE 75 MG PO TABS
75.0000 mg | ORAL_TABLET | Freq: Once | ORAL | Status: AC
  Administered 2023-04-09: 75 mg via ORAL
  Filled 2023-04-09: qty 1

## 2023-04-09 MED ORDER — SODIUM CHLORIDE 0.9% FLUSH
3.0000 mL | Freq: Two times a day (BID) | INTRAVENOUS | Status: DC
  Administered 2023-04-09 – 2023-04-11 (×4): 3 mL via INTRAVENOUS

## 2023-04-09 MED ORDER — STROKE: EARLY STAGES OF RECOVERY BOOK: Freq: Once | Status: AC

## 2023-04-09 NOTE — Assessment & Plan Note (Deleted)
Seen incidentally on CXR. Incentive spirometry

## 2023-04-09 NOTE — Assessment & Plan Note (Addendum)
Restart losartan

## 2023-04-09 NOTE — Plan of Care (Signed)
These are curbside recommendations based upon the information readily available in the chart on brief review as well as history and examination information provided to me by requesting provider and do not replace a full detailed consult   This is a 72 year old woman with a past medical history significant for sick sinus syndrome s/p pacer, diabetes, hypertension, hyperlipidemia, BMI 30.36, anxiety/depression/fibromyalgia, chronic pain syndrome, fibromyalgia, bipolar disorder, marijuana use, nerve pain, lumbar spinal stenosis with neurogenic claudication, B12 deficiency, peripheral vascular disease  Of note on last cardiology visit 09/10/2022 it was noted that she had missed medications for 3 days due to not having a ride to pick up her antihypertensives  Per triage notes she had a fall at 7 AM with subsequent right hip pain and there was some concern for slurred speech  Head CT personally reviewed, agree with radiology 1. Small age-indeterminate infarct in the anterior limb of the left internal capsule. Consider brain MRI for further evaluation if indicated. 2. Remote infarct in the right caudate head/internal capsule, parenchymal volume loss, and background chronic small-vessel ischemic change.  Current vital signs: BP (!) 106/93 (BP Location: Right Arm)   Pulse 61   Temp (!) 95.1 F (35.1 C) (Rectal) Comment: MD Stafford notified  Resp 14   Ht 5\' 2"  (1.575 m)   Wt 75.3 kg   SpO2 95%   BMI 30.36 kg/m  Vital signs in last 24 hours: Temp:  [95.1 F (35.1 C)] 95.1 F (35.1 C) (09/26 1509) Pulse Rate:  [59-67] 61 (09/26 1509) Resp:  [14] 14 (09/26 1509) BP: (106-136)/(73-93) 106/93 (09/26 1509) SpO2:  [95 %-100 %] 95 % (09/26 1509) Weight:  [75.3 kg] 75.3 kg (09/26 1033)   Basic Metabolic Panel: Recent Labs  Lab 04/09/23 1037  NA 137  K 4.5  CL 105  CO2 22  GLUCOSE 81  BUN 18  CREATININE 0.99  CALCIUM 9.9    CBC: Recent Labs  Lab 04/09/23 1037  WBC 8.9   NEUTROABS 7.1  HGB 14.6  HCT 46.0  MCV 88.5  PLT 217    Coagulation Studies: No results for input(s): "LABPROT", "INR" in the last 72 hours.   Lab Results  Component Value Date   CHOL 225 (H) 05/08/2020   HDL 86 05/08/2020   LDLCALC 110 (H) 05/08/2020   TRIG 174 (H) 05/08/2020    No results found for: "HGBA1C"   Component Ref Range & Units 10/29/22 10:15   Cholesterol, Total 100 - 200 mg/dL 161 High   Triglyceride 35 - 199 mg/dL 096  HDL (High Density Lipoprotein) Cholesterol 35.0 - 85.0 mg/dL 04.5  LDL Calculated 0 - 130 mg/dL 409  VLDL Cholesterol mg/dL 40  Cholesterol/HDL Ratio 3.1   A1c 6.6 High   10/29/2022  Preliminary recs, conveyed to admitting physician via secure chat # Age indeterminant anterior limb of the left internal capsule lacunar stroke # Background chronic microvascular disease # Remote right caudate head internal capsule stroke - Stroke labs HgbA1c, fasting lipid panel - MRI brain to be completed on an outpatient basis if her pacemaker is compatible; this service is not available inpatient at [facility]; repeat head CT at noon tomorrow can be completed in lieu of MRI - CTA head and neck - Frequent neuro checks per stroke protocol - Echocardiogram - Continue home ticagrelor if she is still taking this, otherwise okay for aspirin and Plavix if no contraindications (would use Plavix loading dose of 300 mg) - Risk factor modification - Telemetry monitoring; pacer interrogation -  Blood pressure goal   - Normotension given out of the window (generalized weakness and dizziness for 3 days per chart review), and less clear focal neurological signs/symptoms within the last 48 hours, then permissive hypertension until 48 hours has passed from time of onset - PT consult, OT consult, Speech consult, unless patient is back to baseline - Full neurology consultation to follow tomorrow

## 2023-04-09 NOTE — ED Provider Notes (Signed)
[facility] Provider Note    Event Date/Time   First MD Initiated Contact with Patient 04/09/23 1024     (approximate)   History   Chief Complaint: Fall and Weakness   HPI  Kari Hughes is a 72 y.o. female with a history of hypertension, bipolar disorder, fibromyalgia who comes ED complaining of a fall this morning.  Complains of right hip pain after.  Also complains of generalized weakness and dizziness for the past 3 days.  Endorses decreased oral intake.  No fever.  No chest pain shortness of breath or vomiting or diarrhea.  Was able to walk with EMS.     Physical Exam   Triage Vital Signs: ED Triage Vitals [04/09/23 1027]  Encounter Vitals Group     BP      Systolic BP Percentile      Diastolic BP Percentile      Pulse      Resp      Temp      Temp src      SpO2 98 %     Weight      Height      Head Circumference      Peak Flow      Pain Score      Pain Loc      Pain Education      Exclude from Growth Chart     Most recent vital signs: Vitals:   04/09/23 1027 04/09/23 1031  BP:  108/73  Pulse:  60  Resp:  14  SpO2: 98% 100%    General: Awake, no distress.  CV:  Good peripheral perfusion.  Regular rhythm and rate Resp:  Normal effort.  Clear to auscultation bilaterally Abd:  No distention.  Soft with mild left upper quadrant and epigastric tenderness Other:  No lower extremity edema, full range of motion all extremities.  No deformities or wounds.  No bony point tenderness. Dry oral mucosa.  Cranial nerves III through XII intact.  No focal drift.  Language speech and orientation appear to be normal, allowing for upper and lower dentures.   ED Results / Procedures / Treatments   Labs (all labs ordered are listed, but only abnormal results are displayed) Labs Reviewed  URINE DRUG SCREEN, QUALITATIVE ([facility] ONLY) - Abnormal; Notable for the following components:      Result Value   Cannabinoid 50 Ng, Ur [place] POSITIVE (*)     All other components within normal limits  URINALYSIS, ROUTINE W REFLEX MICROSCOPIC - Abnormal; Notable for the following components:   Color, Urine YELLOW (*)    APPearance CLOUDY (*)    Hgb urine dipstick SMALL (*)    Leukocytes,Ua SMALL (*)    Bacteria, UA RARE (*)    All other components within normal limits  CBC WITH DIFFERENTIAL/PLATELET - Abnormal; Notable for the following components:   RBC 5.20 (*)    RDW 16.5 (*)    All other components within normal limits  RESP PANEL BY RT-PCR (RSV, FLU A&B, COVID)  [id number]  ETHANOL  COMPREHENSIVE METABOLIC PANEL  LIPASE, BLOOD     EKG Interpreted by me Atrial paced rhythm, rate of 64.  Normal axis, first-degree AV block.  Poor R wave progression.  1 PVC on the strip.  No acute ischemic changes.   RADIOLOGY CT head interpreted by me, negative for intracranial hemorrhage.  Radiology report reviewed noting small subacute infarct   PROCEDURES:  Procedures   MEDICATIONS ORDERED IN ED:  Medications  aspirin chewable tablet 324 mg (324 mg Oral Given 04/09/23 1441)     IMPRESSION / MDM / ASSESSMENT AND PLAN / ED COURSE  I reviewed the triage vital signs and the nursing notes.  DDx: Dehydration, AKI, electrolyte abnormality, COVID, subacute stroke, pneumonia, pelvis fracture  Patient's presentation is most consistent with acute presentation with potential threat to life or bodily function.  Patient presents with generalized weakness, clinically appears dehydrated.  Will give IV fluids while checking labs, CT head.   ----------------------------------------- 2:47 PM on 04/09/2023 ----------------------------------------- CT reveals subacute infarct in the left internal capsule.  Sister at bedside updated.  Case discussed with hospitalist for further management.      FINAL CLINICAL IMPRESSION(S) / ED DIAGNOSES   Final diagnoses:  Acute ischemic stroke (HCC)     Rx / DC Orders   ED Discharge Orders     None         Note:  This document was prepared using Dragon voice recognition software and may include unintentional dictation errors.   Sharman Cheek, MD 04/09/23 [phone]

## 2023-04-09 NOTE — ED Notes (Signed)
Pt eating dinner at this time, denies other needs at this time

## 2023-04-09 NOTE — ED Triage Notes (Signed)
Pt BIBA from home. EMS reports pt had a fall at 0700 this morning. Complaining of right hip pain. Pt ambulating outside still complaining of weakness and neighbor noticed slurred speech. Pt does have slurred speech but A&Ox4. Provider at bedside. No Code stroke per provider.

## 2023-04-09 NOTE — H&P (Addendum)
History and Physical    PatientPollyann Hughes NFA:[id number] DOB: Jul 20, 1950 DOA: 04/09/2023 DOS: the patient was seen and examined on 04/09/2023 PCP: Enid Baas, MD  Patient coming from: Home  Chief Complaint:  Chief Complaint  Patient presents with   Fall   Weakness   HPI: Kari Hughes is a 72 y.o. female with medical history significant of chronic vertigo, neuromuscular weakness that is attributed to spinal stenosis, chronic need for using a walker.  In spite of this patient reports that she has a tendency to "sway".  She has had at least 2 falls besides today in the last 1 month.  She also reports that she gets early fatigue which manifests itself as shaking of her extremities after a period of exertion.  Prompting her to abort her activities of daily living often times before completion.  Patient was in her usual state of health till approximately 7 AM today when she reports a sensation of diplopia, with both eyes.  However patient did try to get to the kitchen and get something to eat, unfortunately she fell along the way.  With ongoing right-sided thigh pain since then.  However she reports no new back pain, reports only chronic back pain and declined x-ray for same.  She was helped back by an acquaintance.  Subsequently patient's aide arrived at home and noticed that patient was having slurred speech, patient was brought to the ER.  Patient does not report any new focal weakness.  No trouble swallowing, she has actually passed swallow screen in the ER There has been no change in bladder or bowel habits.  Patient's only complaints are slight diplopia, slurred speech noticed by other people.  After arrival to the ER, patient slurred speech was corroborated by healthcare staff and patient is s/p CAT scan of the head which is concerning for subacute cerebral infarction.  Medical admission is sought.   Review of Systems: As mentioned in the history of present illness. All other  systems reviewed and are negative. Past Medical History:  Diagnosis Date   Asthma    Depression    Hypertension    Hypertension 2002   Spinal stenosis 2012   Vertigo    Past Surgical History:  Procedure Laterality Date   JOINT REPLACEMENT Left 2013   PACEMAKER PLACEMENT  2019   Social History:  reports that she has been smoking cigarettes and e-cigarettes. She has never used smokeless tobacco. She reports current alcohol use of about 1.0 standard drink of alcohol per week. She reports that she does not currently use drugs after having used the following drugs: Marijuana.  No Known Allergies  Family History  Problem Relation Age of Onset   Drug abuse Brother    Alcohol abuse Brother    Drug abuse Brother    Alcohol abuse Brother     Prior to Admission medications   Medication Sig Start Date End Date Taking? Authorizing Provider  acetaminophen (TYLENOL) 500 MG tablet Take 1,000 mg by mouth every 6 (six) hours as needed.   Yes [provider]  albuterol (VENTOLIN HFA) 108 (90 Base) MCG/ACT inhaler Inhale 2 puffs into the lungs every 6 (six) hours as needed for shortness of breath or wheezing. 03/27/22 12/18/23 Yes [provider]  amLODipine (NORVASC) 10 MG tablet Take 1 tablet by mouth daily. 03/31/23  Yes [provider]  aspirin 81 MG chewable tablet Chew 81 mg by mouth daily.   Yes [provider]  baclofen (LIORESAL) 10 MG tablet  Take 10 mg by mouth 2 (two) times daily. 03/09/23 09/05/23 Yes [provider]  gabapentin (NEURONTIN) 600 MG tablet Take 600 mg by mouth 3 (three) times daily. 03/09/23  Yes [provider]  loratadine (CLARITIN) 10 MG tablet Take 10 mg by mouth daily. 03/27/22  Yes [provider]  losartan (COZAAR) 50 MG tablet Take 1 tablet by mouth daily. 09/05/22 09/05/23 Yes [provider]  naproxen (NAPROSYN) 500 MG tablet Take 500 mg by mouth 2 (two) times daily. 03/13/23  Yes [provider]  cyclobenzaprine (FLEXERIL) 10 MG tablet Take 10 mg by mouth 2 (two) times daily as needed for muscle spasms. Patient not taking: Reported on 04/09/2023 08/01/22   [provider]  FLUoxetine (PROZAC) 20 MG capsule Take 20 mg by mouth daily. Patient not taking: Reported on 04/09/2023 12/03/21   [provider]  metroNIDAZOLE (FLAGYL) 500 MG tablet Take 500 mg by mouth 2 (two) times daily. Patient not taking: Reported on 04/09/2023 03/09/23   [provider]  pregabalin (LYRICA) 25 MG capsule Take 2 capsules (50 mg total) by mouth every 8 (eight) hours. 07/28/22 10/26/22  Edward Jolly, MD  terbinafine (LAMISIL) 250 MG tablet Take 250 mg by mouth daily. Patient not taking: Reported on 04/09/2023 10/21/22   [provider]  traZODone (DESYREL) 100 MG tablet TAKE 1 TABLET BY MOUTH EVERYDAY AT BEDTIME Patient not taking: Reported on 04/09/2023 03/13/21   Jomarie Longs, MD    Physical Exam: Vitals:   04/09/23 1400 04/09/23 1430 04/09/23 1435 04/09/23 1509  BP: 114/79 (!) 118/90 127/81 (!) 106/93  Pulse: 64 64 (!) 59 61  Resp:    14  Temp:    (!) 95.1 F (35.1 C)  TempSrc:    Rectal  SpO2: 96% 99% 100% 95%  Weight:      Height:       General: Patient is alert and awake gives a coherent account of her symptoms today.  Does not appear to be any distress.  Sister at bedside, aide at bedside. Respiratory exam: Bilateral intravesicular Cardiovascular exam S1-S2 normal Abdomen all quadrants soft nontender Extremities warm without edema Neuromuscular exam: No marked gaze palsy is noted.  There is no marked focal weakness.  Please note patient is deconditioned No rash on skin Speech is felt to be at baseline per acquaintances at bedside. [name] is eating regular food. There is diffuse tenderness over the spine that patient attributes to "fibromyalgia" Per RN Boykin Reaper, patinet has passed swallow screen. Data Reviewed:  Labs on Admission:  Results for orders  placed or performed during the hospital encounter of 04/09/23 (from the past 24 hour(s))  Ethanol     Status: None   Collection Time: 04/09/23 10:37 AM  Result Value Ref Range   Alcohol, Ethyl (B) <10 <10 mg/dL  Comprehensive metabolic panel     Status: None   Collection Time: 04/09/23 10:37 AM  Result Value Ref Range   Sodium 137 135 - 145 mmol/L   Potassium 4.5 3.5 - 5.1 mmol/L   Chloride 105 98 - 111 mmol/L   CO2 22 22 - 32 mmol/L   Glucose, Bld 81 70 - 99 mg/dL   BUN 18 8 - 23 mg/dL   Creatinine, Ser 7.34 0.44 - 1.00 mg/dL   Calcium 9.9 8.9 - 19.3 mg/dL   Total Protein 7.9 6.5 - 8.1 g/dL   Albumin 4.2 3.5 - 5.0 g/dL   AST 15 15 - 41 U/L  ALT 14 0 - 44 U/L   Alkaline Phosphatase 71 38 - 126 U/L   Total Bilirubin 0.4 0.3 - 1.2 mg/dL   GFR, Estimated >46 >96 mL/min   Anion gap 10 5 - 15  CBC WITH DIFFERENTIAL     Status: Abnormal   Collection Time: 04/09/23 10:37 AM  Result Value Ref Range   WBC 8.9 4.0 - 10.5 K/uL   RBC 5.20 (H) 3.87 - 5.11 MIL/uL   Hemoglobin 14.6 12.0 - 15.0 g/dL   HCT 29.5 28.4 - 13.2 %   MCV 88.5 80.0 - 100.0 fL   MCH 28.1 26.0 - 34.0 pg   MCHC 31.7 30.0 - 36.0 g/dL   RDW 44.0 (H) 10.2 - 72.5 %   Platelets 217 150 - 400 K/uL   nRBC 0.0 0.0 - 0.2 %   Neutrophils Relative % 80 %   Neutro Abs 7.1 1.7 - 7.7 K/uL   Lymphocytes Relative 13 %   Lymphs Abs 1.1 0.7 - 4.0 K/uL   Monocytes Relative 7 %   Monocytes Absolute 0.6 0.1 - 1.0 K/uL   Eosinophils Relative 0 %   Eosinophils Absolute 0.0 0.0 - 0.5 K/uL   Basophils Relative 0 %   Basophils Absolute 0.0 0.0 - 0.1 K/uL   Immature Granulocytes 0 %   Abs Immature Granulocytes 0.03 0.00 - 0.07 K/uL  Lipase, blood     Status: None   Collection Time: 04/09/23 10:37 AM  Result Value Ref Range   Lipase 25 11 - 51 U/L  Resp panel by RT-PCR (RSV, Flu A&B, Covid) Anterior Nasal Swab     Status: None   Collection Time: 04/09/23 10:51 AM   Specimen: Anterior Nasal Swab  Result Value Ref Range   SARS  Coronavirus 2 by RT PCR NEGATIVE NEGATIVE   Influenza A by PCR NEGATIVE NEGATIVE   Influenza B by PCR NEGATIVE NEGATIVE   Resp Syncytial Virus by PCR NEGATIVE NEGATIVE  Urine Drug Screen, Qualitative     Status: Abnormal   Collection Time: 04/09/23 11:46 AM  Result Value Ref Range   Tricyclic, Ur Screen NONE DETECTED NONE DETECTED   Amphetamines, Ur Screen NONE DETECTED NONE DETECTED   MDMA (Ecstasy)Ur Screen NONE DETECTED NONE DETECTED   Cocaine Metabolite,Ur [place] NONE DETECTED NONE DETECTED   Opiate, Ur Screen NONE DETECTED NONE DETECTED   Phencyclidine (PCP) Ur S NONE DETECTED NONE DETECTED   Cannabinoid 50 Ng, Ur [place] POSITIVE (A) NONE DETECTED   Barbiturates, Ur Screen NONE DETECTED NONE DETECTED   Benzodiazepine, Ur Scrn NONE DETECTED NONE DETECTED   Methadone Scn, Ur NONE DETECTED NONE DETECTED  Urinalysis, Routine w reflex microscopic -Urine, Clean Catch     Status: Abnormal   Collection Time: 04/09/23 11:46 AM  Result Value Ref Range   Color, Urine YELLOW (A) YELLOW   APPearance CLOUDY (A) CLEAR   Specific Gravity, Urine 1.006 1.005 - 1.030   pH 5.0 5.0 - 8.0   Glucose, UA NEGATIVE NEGATIVE mg/dL   Hgb urine dipstick SMALL (A) NEGATIVE   Bilirubin Urine NEGATIVE NEGATIVE   Ketones, ur NEGATIVE NEGATIVE mg/dL   Protein, ur NEGATIVE NEGATIVE mg/dL   Nitrite NEGATIVE NEGATIVE   Leukocytes,Ua SMALL (A) NEGATIVE   RBC / HPF 0-5 0 - 5 RBC/hpf   WBC, UA 11-20 0 - 5 WBC/hpf   Bacteria, UA RARE (A) NONE SEEN   Squamous Epithelial / HPF 21-50 0 - 5 /HPF   Mucus PRESENT    Basic  Metabolic Panel: Recent Labs  Lab 04/09/23 1037  NA 137  K 4.5  CL 105  CO2 22  GLUCOSE 81  BUN 18  CREATININE 0.99  CALCIUM 9.9   Liver Function Tests: Recent Labs  Lab 04/09/23 1037  AST 15  ALT 14  ALKPHOS 71  BILITOT 0.4  PROT 7.9  ALBUMIN 4.2   Recent Labs  Lab 04/09/23 1037  LIPASE 25   No results for input(s): "AMMONIA" in the last 168 hours. CBC: Recent Labs  Lab  04/09/23 1037  WBC 8.9  NEUTROABS 7.1  HGB 14.6  HCT 46.0  MCV 88.5  PLT 217   Cardiac Enzymes: No results for input(s): "CKTOTAL", "CKMB", "CKMBINDEX", "TROPONINIHS" in the last 168 hours.  BNP (last 3 results) No results for input(s): "PROBNP" in the last 8760 hours. CBG: No results for input(s): "GLUCAP" in the last 168 hours.  Radiological Exams on Admission:  DG Pelvis Portable  Result Date: 04/09/2023 CLINICAL DATA:  Fall, bilateral hip pain EXAM: PORTABLE PELVIS 1-2 VIEWS COMPARISON:  None Available. FINDINGS: There is no acute fracture or dislocation. Femoroacetabular alignment is maintained. There is mild right worse than left femoroacetabular joint space narrowing and subchondral sclerosis consistent with osteoarthritis. The SI joints and symphysis pubis are intact. There is no erosive change. The soft tissues are unremarkable. IMPRESSION: No acute fracture or dislocation. Electronically Signed   By: Lesia Hausen M.D.   On: 04/09/2023 13:49   CT HEAD WO CONTRAST  Result Date: 04/09/2023 CLINICAL DATA:  Slurred speech EXAM: CT HEAD WITHOUT CONTRAST TECHNIQUE: Contiguous axial images were obtained from the base of the skull through the vertex without intravenous contrast. RADIATION DOSE REDUCTION: This exam was performed according to the departmental dose-optimization program which includes automated exposure control, adjustment of the mA and/or kV according to patient size and/or use of iterative reconstruction technique. COMPARISON:  None Available. FINDINGS: Brain: There is no acute intracranial hemorrhage, extra-axial fluid collection, or acute territorial infarct. There is moderate parenchymal volume loss with prominence of the ventricular system and extra-axial CSF spaces. There is a small age-indeterminate infarct in the anterior limb of the left internal capsule and a remote infarct in the right caudate head/internal capsule. Additional patchy hypodensity in the  supratentorial white matter likely reflects sequela of underlying chronic small-vessel ischemic change. The pituitary and suprasellar region are normal. There is no mass lesion there is no mass effect or midline shift. Vascular: There is calcification of the bilateral carotid siphons. Skull: Normal. Negative for fracture or focal lesion. Sinuses/Orbits: The imaged paranasal sinuses are clear. The globes and orbits are unremarkable. Other: The mastoid air cells and middle ear cavities are clear. IMPRESSION: 1. Small age-indeterminate infarct in the anterior limb of the left internal capsule. Consider brain MRI for further evaluation if indicated. 2. Remote infarct in the right caudate head/internal capsule, parenchymal volume loss, and background chronic small-vessel ischemic change. Electronically Signed   By: Lesia Hausen M.D.   On: 04/09/2023 13:48   DG Chest Portable 1 View  Result Date: 04/09/2023 CLINICAL DATA:  Weakness and shortness breath EXAM: PORTABLE CHEST 1 VIEW COMPARISON:  Chest radiograph dated 03/13/2021 FINDINGS: Left chest wall pacemaker leads project over the right atrium and ventricle. Low lung volumes with bronchovascular crowding. Bibasilar patchy opacities. No pleural effusion or pneumothorax. The heart size and mediastinal contours are within normal limits. No acute osseous abnormality. IMPRESSION: Low lung volumes with bibasilar patchy opacities, which may represent atelectasis or infection. Electronically Signed  By: Agustin Cree M.D.   On: 04/09/2023 12:14    EKG: Independently reviewed. Paced rhythm, PVC   Assessment and Plan: * Stroke (cerebrum) (HCC) An acute/subacute stroke is strongly is suspected in this lady who has been complaining of slight diplopia since this morning and was noted to have slurred speech per family members.  Symptoms have largely resolved at this time and on physical exam, I did not find any abnormal findings after accounting for the patient's  deconditioning.  CAT scan shows small age-indeterminate infarct in the anterior limb of the left internal capsule.  We will pursue this with an MRI.  Patient has received aspirin in the ER already.  I will order the full stroke order set.  I will start the patient on statin.  I will also order Plavix 1 dose.  Further aspirin and Plavix may be ordered after we have the MRI report.   MRI may be delayed due to  implanted pacemaker. Hold anti HTN regimen for 24 hours. Keep SBP under 200 mmhg  Atelectasis Seen incidentally on CXR. Incentive spirometry  Fall at home, initial encounter She has chronic vertigo, chronic chronic neuromuscular weakness, dependence on walker in spite of this patient reports chronic gait instability.  Patient describes new onset of diplopia today that may have contributed to fall.  Although patient has had several other falls recently.  At this time patient is declining need for back imaging.  Her right lower extremity pain in the thigh area is not concerning for any bony lesion.  We will continue to monitor clinically  Cannabinoids may contribute as well...  Hypertension Hold losartan, amlodipine given concern for acute stroke.   DVT ppx ordred.  Patient advised to quit smoking.   Advance Care Planning:   Code Status: Prior full code.  Consults: neurology consult sought via EPIC order.  Family Communication: sister at bedside. All questions ordered.  Severity of Illness: The appropriate patient status for this patient is INPATIENT. Inpatient status is judged to be reasonable and necessary in order to provide the required intensity of service to ensure the patient's safety. The patient's presenting symptoms, physical exam findings, and initial radiographic and laboratory data in the context of their chronic comorbidities is felt to place them at high risk for further clinical deterioration. Furthermore, it is not anticipated that the patient will be medically stable  for discharge from the hospital within 2 midnights of admission.   * I certify that at the point of admission it is my clinical judgment that the patient will require inpatient hospital care spanning beyond 2 midnights from the point of admission due to high intensity of service, high risk for further deterioration and high frequency of surveillance required.*  Author: Nolberto Hanlon, MD 04/09/2023 3:32 PM  For on call review [email or web address].

## 2023-04-09 NOTE — Assessment & Plan Note (Addendum)
Neurology recommended aspirin and Lipitor.  Recommended interrogation of pacemaker to rule out arrhythmia and atrial fibrillation and echocardiogram done and read prior to disposition.  Physical therapy recommending home health.

## 2023-04-09 NOTE — Assessment & Plan Note (Deleted)
She has chronic vertigo, chronic chronic neuromuscular weakness, dependence on walker in spite of this patient reports chronic gait instability.  Patient describes new onset of diplopia today that may have contributed to fall.  Although patient has had several other falls recently.  At this time patient is declining need for back imaging.  Her right lower extremity pain in the thigh area is not concerning for any bony lesion.  We will continue to monitor clinically  Cannabinoids may contribute as well.Marland KitchenMarland Kitchen

## 2023-04-09 NOTE — ED Notes (Signed)
Dr. Scotty Court at bedside, updating pt. And family on pt. Condition and POC.

## 2023-04-10 ENCOUNTER — Encounter: Payer: Self-pay | Admitting: Internal Medicine

## 2023-04-10 ENCOUNTER — Inpatient Hospital Stay: Admit: 2023-04-10 | Payer: Medicare HMO

## 2023-04-10 ENCOUNTER — Inpatient Hospital Stay
Admit: 2023-04-10 | Discharge: 2023-04-10 | Disposition: A | Discharge status: Home / Self Care | Payer: Medicare HMO | Attending: Internal Medicine | Admitting: Internal Medicine

## 2023-04-10 ENCOUNTER — Inpatient Hospital Stay: Payer: Medicare HMO

## 2023-04-10 DIAGNOSIS — I10 Essential (primary) hypertension: Secondary | ICD-10-CM

## 2023-04-10 DIAGNOSIS — E669 Obesity, unspecified: Secondary | ICD-10-CM

## 2023-04-10 DIAGNOSIS — W19XXXS Unspecified fall, sequela: Secondary | ICD-10-CM | POA: Diagnosis not present

## 2023-04-10 DIAGNOSIS — M4807 Spinal stenosis, lumbosacral region: Secondary | ICD-10-CM

## 2023-04-10 DIAGNOSIS — I639 Cerebral infarction, unspecified: Secondary | ICD-10-CM | POA: Diagnosis not present

## 2023-04-10 DIAGNOSIS — R296 Repeated falls: Secondary | ICD-10-CM

## 2023-04-10 DIAGNOSIS — E785 Hyperlipidemia, unspecified: Secondary | ICD-10-CM

## 2023-04-10 DIAGNOSIS — W19XXXA Unspecified fall, initial encounter: Secondary | ICD-10-CM

## 2023-04-10 LAB — CBC
HCT: 38.9 % (ref 36.0–46.0)
Hemoglobin: 12.6 g/dL (ref 12.0–15.0)
MCH: 28 pg (ref 26.0–34.0)
MCHC: 32.4 g/dL (ref 30.0–36.0)
MCV: 86.4 fL (ref 80.0–100.0)
Platelets: 192 10*3/uL (ref 150–400)
RBC: 4.5 MIL/uL (ref 3.87–5.11)
RDW: 16.8 % — ABNORMAL HIGH (ref 11.5–15.5)
WBC: 6.9 10*3/uL (ref 4.0–10.5)
nRBC: 0 % (ref 0.0–0.2)

## 2023-04-10 LAB — LIPID PANEL
Cholesterol: 175 mg/dL (ref 0–200)
HDL: 48 mg/dL (ref >40)
LDL Cholesterol: 116 mg/dL — ABNORMAL HIGH (ref 0–99)
Total CHOL/HDL Ratio: 3.6 {ratio}
Triglycerides: 56 mg/dL (ref <150)
VLDL: 11 mg/dL (ref 0–40)

## 2023-04-10 LAB — BASIC METABOLIC PANEL
Anion gap: 8 (ref 5–15)
BUN: 23 mg/dL (ref 8–23)
CO2: 21 mmol/L — ABNORMAL LOW (ref 22–32)
Calcium: 8.7 mg/dL — ABNORMAL LOW (ref 8.9–10.3)
Chloride: 113 mmol/L — ABNORMAL HIGH (ref 98–111)
Creatinine, Ser: 0.93 mg/dL (ref 0.44–1.00)
GFR, Estimated: >60 mL/min (ref >60)
Glucose, Bld: 94 mg/dL (ref 70–99)
Potassium: 4.4 mmol/L (ref 3.5–5.1)
Sodium: 142 mmol/L (ref 135–145)

## 2023-04-10 LAB — VITAMIN B12: Vitamin B-12: 234 pg/mL (ref 180–914)

## 2023-04-10 LAB — APTT: aPTT: 28 s (ref 24–36)

## 2023-04-10 LAB — ECHOCARDIOGRAM COMPLETE
Height: 62 in
Weight: 2656 [oz_av]

## 2023-04-10 LAB — PROTIME-INR
INR: 1 (ref 0.8–1.2)
Prothrombin Time: 13.7 s (ref 11.4–15.2)

## 2023-04-10 MED ORDER — CLOPIDOGREL BISULFATE 75 MG PO TABS
75.0000 mg | ORAL_TABLET | Freq: Every day | ORAL | Status: DC
  Administered 2023-04-10: 75 mg via ORAL
  Filled 2023-04-10: qty 1

## 2023-04-10 MED ORDER — ASPIRIN 81 MG PO TBEC
81.0000 mg | DELAYED_RELEASE_TABLET | Freq: Every day | ORAL | Status: DC
  Administered 2023-04-10 – 2023-04-11 (×2): 81 mg via ORAL
  Filled 2023-04-10 (×2): qty 1

## 2023-04-10 MED ORDER — IOHEXOL 350 MG/ML SOLN
75.0000 mL | Freq: Once | INTRAVENOUS | Status: AC | PRN
  Administered 2023-04-10: 75 mL via INTRAVENOUS

## 2023-04-10 MED ORDER — LORATADINE 10 MG PO TABS
10.0000 mg | ORAL_TABLET | Freq: Every day | ORAL | Status: DC
  Administered 2023-04-10 – 2023-04-11 (×2): 10 mg via ORAL
  Filled 2023-04-10 (×2): qty 1

## 2023-04-10 MED ORDER — LOSARTAN POTASSIUM 50 MG PO TABS
50.0000 mg | ORAL_TABLET | Freq: Every day | ORAL | Status: DC
  Administered 2023-04-10 – 2023-04-11 (×2): 50 mg via ORAL
  Filled 2023-04-10 (×2): qty 1

## 2023-04-10 NOTE — Assessment & Plan Note (Signed)
BMI 30.36

## 2023-04-10 NOTE — Evaluation (Signed)
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   Atelectasis 04/09/2023   GAD (generalized anxiety disorder) 12/27/2020   Cannabis abuse, episodic use 12/27/2020   Lumbar degenerative disc disease 10/09/2020   Lumbar facet arthropathy 10/09/2020   Spinal stenosis, lumbar region, with neurogenic claudication 10/09/2020   Bipolar disorder (HCC) 07/12/2020   Marijuana use 07/12/2020   On anticoagulant therapy 07/12/2020   Nerve pain 07/12/2020   Right otitis media 07/03/2020   Pharyngitis 07/03/2020   MDD (major depressive disorder), recurrent episode, moderate (HCC)    Fibromyalgia    Chronic pain syndrome    Acute non-recurrent sinusitis 06/01/2020   Anxiety 06/01/2020   Encounter for medication refill 06/01/2020   No-show for appointment 05/28/2020   Chronic bilateral low back pain with bilateral sciatica 05/11/2020   Chronic joint pain 05/11/2020   Hypertension 05/11/2020   Change in vision 05/11/2020   Body mass index 26.0-26.9, adult 05/11/2020   Hyperlipidemia 05/10/2020   Pacemaker 05/08/2020   Lumbar radiculopathy 03/30/2020   Past Medical History:  Past Medical History:  Diagnosis Date   Asthma    Depression    Hypertension    Hypertension 2002   Spinal stenosis 2012   Vertigo    Past Surgical History:  Past Surgical History:  Procedure Laterality Date   JOINT REPLACEMENT Left 2013   PACEMAKER PLACEMENT  2019   HPI:  Pt is a  72 y.o. female who presented to ED on 04/08/23 s/p fall and c/o generalized weakess and dizziness for past 3 days. Pt  with a history of hypertension, bipolar disorder, and fibromyalgia. Head CT on admit, "1. Small age-indeterminate infarct  in the anterior limb of the left  internal capsule. Consider brain MRI for further evaluation if  indicated.  2. Remote infarct in the right caudate head/internal capsule,  parenchymal volume loss, and background chronic small-vessel  ischemic change."   Assessment / Plan / Recommendation Clinical Impression  Pt seen for limited cognitive-communication evaluation. Assessment d/c'd prematurely as tranport arrived to bring pt to CT. Assessment initiated via informal means. Pt A&Ox4. Pt speech is minimally imprecise and largely intelligible. Clarity improves with increased vocal loudness. Pt with impaired sustained attention, pragmatics (reduced turntaking, topic appopriateness), and insight. Impulsivity appreciated. Basic primary language ability appears WFL. Further assessment warranted of all domains of cognition. Based on today's limited asessment, anticipate need for continued SLP services at d/c.    SLP Assessment  SLP Recommendation/Assessment: Patient needs continued Speech Lanaguage Pathology Services SLP Visit Diagnosis: Cognitive communication deficit (R41.841)    Recommendations for follow up therapy are one component of a multi-disciplinary discharge planning process, led by the attending physician.  Recommendations may be updated based on patient status, additional functional criteria and insurance authorization.    Follow Up Recommendations   (TBD; anticipate need for post-acute SLP services)    Assistance Recommended at Discharge  Frequent or constant Supervision/Assistance  Functional Status Assessment Patient has had a recent decline in their functional status and demonstrates the ability to make significant improvements in function in a reasonable and predictable amount of time.  Frequency and Duration min 2x/week  2 weeks      SLP Evaluation Cognition  Overall Cognitive Status: No family/caregiver present  to determine baseline cognitive functioning Arousal/Alertness:  Awake/alert Orientation Level: Oriented X4 Attention: Sustained Sustained Attention: Impaired Sustained Attention Impairment: Verbal basic;Functional basic Awareness: Impaired Awareness Impairment: Intellectual impairment;Emergent impairment;Anticipatory impairment Behaviors: Impulsive       Comprehension  Auditory Comprehension Overall Auditory Comprehension:  (to be further assessed; answers basic yes/no questions and follows 1-step commands) Interfering Components: Attention    Expression Expression Primary Mode of Expression: Verbal Verbal Expression Overall Verbal Expression: Impaired Initiation: No impairment Automatic Speech: Name;Social Response Level of Generative/Spontaneous Verbalization: Sentence;Conversation Repetition: No impairment Naming:  (to be assessed further; no overt wordfinding difficulty) Pragmatics: Impairment Impairments: Topic appropriateness;Topic maintenance;Turn Taking Interfering Components: Attention Written Expression Written Expression: Not tested   Oral / Motor  Motor Speech Overall Motor Speech: Impaired Respiration: Within functional limits Phonation: Normal Resonance: Within functional limits Articulation:  (minimally imprecise articulation) Intelligibility: Intelligibility reduced (>95% at all levels)           Clyde Canterbury, M.S., CCC-SLP Speech-Language Pathologist [facility] [phone] Arnette Felts)  Woodroe Chen 04/10/2023, 10:05 AM

## 2023-04-10 NOTE — Progress Notes (Signed)
OT Cancellation Note  Patient Details Name: Kari Hughes MRN: [id number] DOB: 1950-10-14   Cancelled Treatment:    Reason Eval/Treat Not Completed: Patient at procedure or test/ unavailable (Pt with speech at a time of attempt, transport also present to take Pt to CT. OT will reattempt as able.)  Glenard Haring, OTS

## 2023-04-10 NOTE — Assessment & Plan Note (Signed)
Secondary to acute infection superimposed on underlying comorbidities of severe COPD, underweight, lung cancer PT eval and treat 

## 2023-04-10 NOTE — Evaluation (Signed)
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 Prior to hospital admission, pt was MOD-I with ADLs, an aid assists Pt 2 hours daily with IADLs. Pt lives in senior community, and reports having a supportive community. Pt presents to acute OT demonstrating impaired ADL performance and functional mobility. (See OT problem list for additional functional deficits). Pt reports a history of vertigo, dizziness reported at times throughout evaluation. Pt required supervision during LB dressing. Pt currently requires CGA + RW for mobility, and verbal cuing for safety during amb ~ 78ft. Pt consistently uses furniture walking when amb, and states that is how she gets around everywhere, Pt stated her rollator isn't enough support for her. Pt was edu on the amb techniques (RW)  aside from using furniture to stabilize herself. Pt would benefit from skilled OT services to address noted impairments and functional limitations (see below for any additional details) in order to maximize safety and independence while minimizing falls risk and caregiver burden. OT will follow acutely.     If plan is discharge home, recommend the following: A little help with walking and/or transfers;A little help  with bathing/dressing/bathroom    Functional Status Assessment  Patient has had a recent decline in their functional status and demonstrates the ability to make significant improvements in function in a reasonable and predictable amount of time.  Equipment Recommendations  BSC/3in1;Other (comment) (Rolling walker)    Recommendations for Other Services       Precautions / Restrictions Precautions Precautions: Fall Precaution Comments: vertigo Restrictions Weight Bearing Restrictions: No      Mobility Bed Mobility Overal bed mobility: Needs Assistance Bed Mobility: Supine to Sit     Supine to sit: HOB elevated, Used rails, Supervision     General bed mobility comments: able to perform bed mobility with no cuing required for hand placement; occasional cues for completion of task due to getting distracted talking    Transfers Overall transfer level: Needs assistance Equipment used: Rolling walker (2 wheels) Transfers: Sit to/from Stand Sit to Stand: Contact guard assist           General transfer comment: min cuing for AD management but otherwise CGA for safety      Balance Overall balance assessment: Needs assistance, History of Falls Sitting-balance support: No upper extremity supported, Feet supported Sitting balance-Leahy Scale: Good Sitting balance - Comments: able to maintain seated EOB balance during functional activities with no LOB noted   Standing balance support: Single extremity supported, During functional activity Standing balance-Leahy Scale: Good Standing balance comment: Able to maintain static standing balance during functional activities with min swaying noted                           ADL either performed or assessed with clinical judgement   ADL Overall ADL's : Needs assistance/impaired  Lower Body Dressing: Supervision/safety;Sit to/from stand Lower Body Dressing Details (indicate cue type and  reason): Don/doff socks Toilet Transfer: Ambulation;Rolling walker (2 wheels);Contact guard assist Toilet Transfer Details (indicate cue type and reason): Simulated toilet t/f; bed <> recliner needing CGA + 2         Functional mobility during ADLs: Contact guard assist;Cueing for safety       Vision Patient Visual Report: Diplopia;Other (comment) Vision Assessment?: Yes Eye Alignment: Within Functional Limits Ocular Range of Motion: Within Functional Limits Alignment/Gaze Preference: Within Defined Limits Tracking/Visual Pursuits: Able to track stimulus in all quads without difficulty Convergence: Within functional limits Visual Fields: No apparent deficits Additional Comments: pt reports ongoing diplopia     Perception         Praxis         Pertinent Vitals/Pain Pain Assessment Pain Assessment: No/denies pain     Extremity/Trunk Assessment Upper Extremity Assessment Upper Extremity Assessment: Overall WFL for tasks assessed (MMT BUE 4+/5)   Lower Extremity Assessment Lower Extremity Assessment: Defer to PT evaluation;Generalized weakness       Communication Communication Communication: No apparent difficulties Cueing Techniques: Verbal cues;Tactile cues   Cognition Arousal: Alert Behavior During Therapy: WFL for tasks assessed/performed                                         General Comments       Exercises Other Exercises Other Exercises: Edu: safety ADL completion, DME management during ADLs   Shoulder Instructions      Home Living Family/patient expects to be discharged to:: Assisted living   Available Help at Discharge: Friend(s);Family;Personal care attendant- 2 hrs per day, every day that assists with IADLs Type of Home: Apartment                       Home Equipment: Shower seat;Rollator (4 wheels);Grab bars - toilet;Grab bars - tub/shower   Additional Comments: ramp  Lives With: Alone    Prior  Functioning/Environment Prior Level of Function : History of Falls (last six months)             Mobility Comments: States falls last week and the past week, "trouble lifitng my feet up" furniture surfs or use of rollator ADLs Comments:  Pt reports generally MOD I with ADL, Per Pt report she has an aid that comes to her apartment at her residences at the senior community for 2 hours daily. The aid assist her in grocery shopping, and home managment tasks.        OT Problem List: Decreased coordination;Decreased knowledge of precautions;Decreased activity tolerance;Impaired balance (sitting and/or standing);Decreased safety awareness;Decreased knowledge of use of DME or AE;Impaired vision/perception      OT Treatment/Interventions: Self-care/ADL training    OT Goals(Current goals can be found in the care plan section) Acute Rehab OT Goals Patient Stated Goal: To return home with more support OT Goal Formulation: With patient Time For Goal Achievement: 04/25/23 Potential to Achieve Goals: Good ADL Goals Pt Will Perform Grooming: with modified independence;standing Pt Will Perform Lower Body Dressing: sit to/from stand;with modified independence Pt Will Transfer to Toilet: with modified independence;ambulating;bedside commode Pt Will Perform Toileting - Clothing Manipulation and hygiene: with modified independence;sitting/lateral leans  OT Frequency: Min 1X/week    Co-evaluation PT/OT/SLP Co-Evaluation/Treatment: Yes Reason for Co-Treatment: Necessary to address cognition/behavior during functional activity;To address functional/ADL transfers  PT goals addressed during session: Mobility/safety with mobility;Balance;Proper use of DME;Strengthening/ROM OT goals addressed during session: ADL's and self-care;Proper use of Adaptive equipment and DME      AM-PAC OT "6 Clicks" Daily Activity     Outcome Measure Help from another person eating meals?: None Help from another person taking  care of personal grooming?: A Little Help from another person toileting, which includes using toliet, bedpan, or urinal?: A Little Help from another person bathing (including washing, rinsing, drying)?: A Little Help from another person to put on and taking off regular upper body clothing?: None Help from another person to put on and taking off regular lower body clothing?: None 6 Click Score: 21   End of Session Equipment Utilized During Treatment: Gait belt;Rolling walker (2 wheels) Nurse Communication: Mobility status  Activity Tolerance: Patient tolerated treatment well Patient left: in chair;with call bell/phone within reach;with chair alarm set  OT Visit Diagnosis: Unsteadiness on feet (R26.81);Other abnormalities of gait and mobility (R26.89);Repeated falls (R29.6);Muscle weakness (generalized) (M62.81)                Time: 8295-6213 OT Time Calculation (min): 27 min Charges:  OT General Charges $OT Visit: 1 Visit OT Evaluation $OT Eval Moderate Complexity: 1 Mod  [employer], OTS

## 2023-04-10 NOTE — Progress Notes (Signed)
Progress Note   Patient: Kari Hughes WRU:[id number] DOB: Apr 16, 1951 DOA: 04/09/2023     1 DOS: the patient was seen and examined on 04/10/2023   Brief hospital course: 72 year old female past medical history of chronic vertigo, neuromuscular weakness attributed to spinal stenosis, falls.  She got blurry vision both eyes and had a fall along the way.  And had some slurred speech.  CT scan showed a small age-indeterminate infarct in the anterior limb of the left internal capsule and a remote infarct in the right caudate head internal capsule.  9/27.  CT angiogram shows age-indeterminate infarct of the anterior limb of the left internal capsule.  No high-grade stenosis with head and neck imaging.  Assessment and Plan: * Stroke (cerebrum) [facility]) Neurology recommended aspirin and Lipitor.  Recommended interrogation of pacemaker to rule out arrhythmia and atrial fibrillation and echocardiogram done and read prior to disposition.  Physical therapy recommending home health.  Hypertension Restart losartan  Falls Physical therapy recommending home health  Hyperlipidemia LDL 116 and goal less than 70.  On Lipitor.  Obesity (BMI 30-39.9) BMI 30.36  Spinal stenosis Patient on Lyrica and gabapentin and baclofen.        Subjective: Patient feeling better.  Came in because she could not walk very well and had a fall.  Patient has a history of chronic vertigo and spinal stenosis.  CT scan showing age-indeterminate stroke and a prior stroke.  Physical Exam: Vitals:   04/10/23 0350 04/10/23 0832 04/10/23 1203 04/10/23 1607  BP: 129/74 (!) 152/83 (!) 140/87 (!) 127/90  Pulse: 67 64 66 66  Resp: 16 16 16 16   Temp: 97.6 F (36.4 C) 98.5 F (36.9 C) 98.6 F (37 C) 97.6 F (36.4 C)  TempSrc: Oral Oral    SpO2: 98% 100% 98% 97%  Weight:      Height:       Physical Exam HENT:     Head: Normocephalic.     Mouth/Throat:     Pharynx: No oropharyngeal exudate.  Eyes:     General: Lids  are normal.     Conjunctiva/sclera: Conjunctivae normal.  Cardiovascular:     Rate and Rhythm: Normal rate and regular rhythm.     Heart sounds: Normal heart sounds, S1 normal and S2 normal.  Pulmonary:     Breath sounds: No decreased breath sounds, wheezing, rhonchi or rales.  Abdominal:     Palpations: Abdomen is soft.     Tenderness: There is no abdominal tenderness.  Musculoskeletal:     Right lower leg: No swelling.     Left lower leg: No swelling.  Neurological:     Mental Status: She is alert and oriented to person, place, and time.     Comments: Patient able to straight leg raise bilaterally.  Power 4+ out of 5 bilateral lower extremities and 4+ out of 5 bilateral upper extremities     Data Reviewed: Repeat CT showed previously described age-indeterminate infarct anterior limb of the left internal capsule.  No high-grade stenosis.  Initial CT scan showed small age-indeterminate infarction of left anterior limb of the left internal capsule, remote infarct right caudate head internal capsule.  Family Communication: Left message for sister  Disposition: Status is: Inpatient Remains inpatient appropriate because: Neurology wanted pacemaker interrogated to rule out arrhythmia because treatment is different if she has atrial fibrillation.  And echocardiogram done and read prior to discharge.  Planned Discharge Destination: Home with Home Health    Time spent:35  minutes  Author: Alford Highland, MD 04/10/2023 4:47 PM  For on call review [email or web address].

## 2023-04-10 NOTE — Assessment & Plan Note (Signed)
LDL 116 and goal less than 70.  On Lipitor.

## 2023-04-10 NOTE — Consult Note (Signed)
NEUROLOGY H&P NOTE   Date of service: April 10, 2023 Patient Name: Kari Hughes MRN:  [id number] DOB:  1951-02-12 Chief Complaint: "slurred speech, I had a stroke"  History of Present Illness  Kari Hughes is a 72 y.o. with a past medical history significant for sick sinus syndrome s/p pacer, diabetes, hypertension, hyperlipidemia, BMI 30.36, anxiety/depression/fibromyalgia, chronic pain syndrome, fibromyalgia, bipolar disorder, marijuana use, nerve pain, lumbar spinal stenosis with neurogenic claudication, B12 deficiency, peripheral vascular disease   She falls frequently at home, reports chronic vertigo with no change recently.  Reports difficulty remembering what medications she has taken in a day.  Taking gabapentin, baclofen, Lyrica essentially as needed.  Reports she has 2 hours of in-home health aide today but feels that she needs more.  She has no specific new complaints and reports all of her symptoms have been ongoing for many years, reporting she has poor tolerance for ambulation due to dizziness and that sometimes she has freezing episodes  Last known well: N/A, no focal symptoms, chronic longstanding problems  Modified rankin score: 3-Moderate disability-requires help but walks WITHOUT assistance (walker, 2 hours of in-home help a day, but otherwise independent) tNKASE: no, out of the window Thrombectomy: No NIHSS components Score: Comment  1a Level of Conscious 0[x]  1[]  2[]  3[]      1b LOC Questions 0[x]  1[]  2[]       1c LOC Commands 0[x]  1[]  2[]       2 Best Gaze 0[x]  1[]  2[]       3 Visual 0[x]  1[]  2[]  3[]      4 Facial Palsy 0[]  1[x]  2[]  3[]     Mild right NLF ? baseline  5a Motor Arm - left 0[]  1[x]  2[]  3[]  4[]  UN[]    5b Motor Arm - Right 0[]  1[x]  2[]  3[]  4[]  UN[]    6a Motor Leg - Left 0[x]  1[]  2[]  3[]  4[]  UN[]    6b Motor Leg - Right 0[x]  1[]  2[]  3[]  4[]  UN[]    7 Limb Ataxia 0[]  1[x]  2[]  3[]  UN[]    RUE   8 Sensory 0[x]  1[]  2[]  UN[]      9 Best Language 0[x]  1[]  2[]  3[]       10 Dysarthria 0[x]  1[]  2[]  UN[]      11 Extinct. and Inattention 0[x]  1[]  2[]       TOTAL:       ROS   A detailed 10 point review of system was completed and negative except above. Caveat of clear memory impairment on exam  Past History   Past Medical History:  Diagnosis Date   Asthma    Depression    Hypertension    Hypertension 2002   Spinal stenosis 2012   Vertigo    Past Surgical History:  Procedure Laterality Date   JOINT REPLACEMENT Left 2013   PACEMAKER PLACEMENT  2019   Family History  Problem Relation Age of Onset   Drug abuse Brother    Alcohol abuse Brother    Drug abuse Brother    Alcohol abuse Brother    Social History   Socioeconomic History   Marital status: Single    Spouse name: Not on file   Number of children: 3   Years of education: Not on file   Highest education level: Not on file  Occupational History   Not on file  Tobacco Use   Smoking status: Every Day    Types: Cigarettes, E-cigarettes   Smokeless tobacco: Never   Tobacco comments:    5 cigarettes per day  Vaping Use  Vaping status: Never Used  Substance and Sexual Activity   Alcohol use: Yes    Alcohol/week: 1.0 standard drink of alcohol    Types: 1 Glasses of wine per week    Comment: occasional   Drug use: Not Currently    Types: Marijuana    Comment: stopped 6 months ago   Sexual activity: Not Currently  Other Topics Concern   Not on file  Social History Narrative   Not on file   Social Determinants of Health   Financial Resource Strain: Low Risk  (03/09/2023)   Received from [facility]   Overall Financial Resource Strain (CARDIA)    Difficulty of Paying Living Expenses: Not hard at all  Food Insecurity: No Food Insecurity (04/09/2023)   Hunger Vital Sign    Worried About Running Out of Food in the Last Year: Never true    Ran Out of Food in the Last Year: Never true  Transportation Needs: No Transportation Needs (04/09/2023)   PRAPARE -  Administrator, Civil Service (Medical): No    Lack of Transportation (Non-Medical): No  Physical Activity: Not on file  Stress: Not on file  Social Connections: Not on file   No Known Allergies  Medications   Medications Prior to Admission  Medication Sig Dispense Refill Last Dose   acetaminophen (TYLENOL) 500 MG tablet Take 1,000 mg by mouth every 6 (six) hours as needed.   prn at unk   albuterol (VENTOLIN HFA) 108 (90 Base) MCG/ACT inhaler Inhale 2 puffs into the lungs every 6 (six) hours as needed for shortness of breath or wheezing.   prn at unk   amLODipine (NORVASC) 10 MG tablet Take 1 tablet by mouth daily.   04/09/2023   aspirin 81 MG chewable tablet Chew 81 mg by mouth daily.   04/09/2023   baclofen (LIORESAL) 10 MG tablet Take 10 mg by mouth 2 (two) times daily.   04/09/2023   gabapentin (NEURONTIN) 600 MG tablet Take 600 mg by mouth 3 (three) times daily.   04/09/2023   loratadine (CLARITIN) 10 MG tablet Take 10 mg by mouth daily.   prn at unk   losartan (COZAAR) 50 MG tablet Take 1 tablet by mouth daily.   04/09/2023   naproxen (NAPROSYN) 500 MG tablet Take 500 mg by mouth 2 (two) times daily.   prn at unk   pregabalin (LYRICA) 25 MG capsule Take 2 capsules (50 mg total) by mouth every 8 (eight) hours. 90 capsule 5      Vitals   Vitals:   04/09/23 2350 04/10/23 0350 04/10/23 0832 04/10/23 1203  BP: 94/62 129/74 (!) 152/83 (!) 140/87  Pulse: 61 67 64 66  Resp: 16 16 16 16   Temp: (!) 97.5 F (36.4 C) 97.6 F (36.4 C) 98.5 F (36.9 C) 98.6 F (37 C)  TempSrc: Oral Oral Oral   SpO2: 97% 98% 100% 98%  Weight:      Height:         Body mass index is 30.36 kg/m.  Physical Exam   Constitutional: Appears Cushingoid habitus  Psych: Affect smiling, pleasant Eyes: No scleral injection.  HENT: No OP obstrucion.  Head: Normocephalic.  Cardiovascular: perfusing extremities well  Respiratory: SOB on exertion  GI: Soft.  No distension. There is no tenderness.   Skin: WDI.   Neurologic Examination   Neuro: Mental Status: Patient is awake, alert, oriented to person, place, month, year, and situation. She gives limited history.  1/3 delayed recall No signs of aphasia or neglect Cranial Nerves: II: Visual Fields are full. Pupils are equal, round, and reactive to light.   III,IV, VI: EOMI without ptosis or diploplia. Saccadic pursuits V: Facial sensation is symmetric to temperature VII: Facial movement is notable for mild right nasolabial fold flattening.  VIII: hearing is intact to voice X: Uvula elevates symmetrically XI: Shoulder shrug is symmetric. XII: tongue is midline without atrophy or fasciculations.  Motor: Tone is normal. Bulk is peripherally reduced. Sensory: Sensation is symmetric to light touch and temperature in the arms and legs.  Allodynia throughout Cerebellar: RUE axtia. LUE intact. Heel to shin limited by pain bilaterally Gait:  Steady with walker, minimally wide-based.    Labs   CBC:  Recent Labs  Lab 04/09/23 1037 04/09/23 1615 04/10/23 0646  WBC 8.9 7.9 6.9  NEUTROABS 7.1  --   --   HGB 14.6 13.2 12.6  HCT 46.0 41.8 38.9  MCV 88.5 89.1 86.4  PLT 217 201 192    Basic Metabolic Panel:  Lab Results  Component Value Date   NA 142 04/10/2023   K 4.4 04/10/2023   CO2 21 (L) 04/10/2023   GLUCOSE 94 04/10/2023   BUN 23 04/10/2023   CREATININE 0.93 04/10/2023   CALCIUM 8.7 (L) 04/10/2023   GFRNONAA >60 04/10/2023   GFRAA 73 05/08/2020   Lipid Panel:  Lab Results  Component Value Date   LDLCALC 116 (H) 04/10/2023   HgbA1c:  Lab Results  Component Value Date   HGBA1C 6.1 (H) 04/09/2023   Urine Drug Screen:     Component Value Date/Time   LABOPIA NONE DETECTED 04/09/2023 1146   COCAINSCRNUR NONE DETECTED 04/09/2023 1146   LABBENZ NONE DETECTED 04/09/2023 1146   AMPHETMU NONE DETECTED 04/09/2023 1146   THCU POSITIVE (A) 04/09/2023 1146   LABBARB NONE DETECTED 04/09/2023 1146    Alcohol Level      Component Value Date/Time   ETH <10 04/09/2023 1037   INR  Lab Results  Component Value Date   INR 1.0 04/10/2023   APTT  Lab Results  Component Value Date   APTT 28 04/10/2023    CT angio Head and Neck with contrast(Personally reviewed): 1. Previously described possible age-indeterminate infarct in the anterior limb of the left internal capsule is decreased in conspicuity on the current study. No new acute intracranial pathology. 2. Patent vasculature of the head and neck with mild plaque at the carotid bifurcations, intracranial ICAs, and bilateral M2 segments but no high-grade stenosis or occlusion.  Impression   Kari Hughes is a 72 y.o. female with significant medical history as above  Primary Diagnosis:  # Age indeterminant anterior limb of the left internal capsule lacunar stroke, favor more chronic given repeat Head CT findings from CTA head/neck  Secondary Diagnosis: # Background chronic microvascular disease # Remote right caudate head internal capsule stroke  Recommendations   # Age indeterminant anterior limb of the left internal capsule lacunar stroke, likely chronic # Background chronic microvascular disease # Remote right caudate head internal capsule stroke - A1c meeting goal < 7% (6.1%) - LDL above goal < 70 (116), agree with atorvastatin 40 daily  - Frequent neuro checks per stroke protocol - Echocardiogram read pending, 30 day event monitor if there is Left atrial enlargement - Aspirin 81 mg daily, no role for DAPT given unclear last known well, therefore Plavix discontinued  - Risk factor modification counseling, diet, exercise, medication adherence, smoking cessation - Telemetry monitoring;  pacer interrogation, if there is atrial fibrillation recommend anticoagulation for stroke prevention and stop antiplatelet agents although patient will need counseling on risk of hemorrhage  # Frequent falls - On baclofen, gabapentin, lyrica, try to  de-escalate medications - Given memory impairments needs assistance with medication use monitoring (consider pill packs, direct observation of medication use, higher level of care)  # Smoking: - Counseled on the importance of quitting smoking to reduce risk of stroke. Discussed that nicotine withdrawal symptoms peak in the first three days of smoking cessation and subside over the next three to four weeks. Discussed options including counseling, pharmacological options including Nicotine patch, Chantix, Wellbutrin. We discussed quit date. I also provided the patient a handout with resources and free resources offered by the state of [place] on "https://fox-cook.com/". Patient will also reach out to PCP on followup. - Patient reports interest in using gum and patches, appreciate prescription by primary team  Neurology will sign off at this time, please reach out if questions or concerns arise.  Discussed with primary team via secure chat.  Thank you for involving [place] in the care of this patient ______________________________________________________________________   Signed,  Gordy Councilman [facility] coverage for [facility] is from 8 AM to 4 AM in-house and 4 PM to 8 PM by telephone/video. 8 PM to 8 AM emergent questions or overnight urgent questions should be addressed to Teleneurology On-call or [facility] neurohospitalist; contact information can be found on AMION

## 2023-04-10 NOTE — Evaluation (Signed)
Physical Therapy Evaluation Patient Details Name: Kari Hughes MRN: [id number] DOB: Aug 18, 1950 Today's Date: 04/10/2023  History of Present Illness  Pt is a 72 y.o. female with medical history significant of chronic vertigo, neuromuscular weakness that is attributed to spinal stenosis, chronic need for using a walker.  In spite of this patient reports that she has a tendency to "sway".  She has had at least 2 falls besides today in the last 1 month.  She also reports that she gets early fatigue which manifests itself as shaking of her extremities after a period of exertion.  Prompting her to abort her activities of daily living often times before completion.  Clinical Impression   Pt is seen by OT and PT for co-evaluation secondary to complexity. At baseline, Pt is an occasional community amb but primarily household ambulator, reports fall hx, often furniture walks, and assist from aide 2 hours/day at living facility for IADLs. Pt performs bed mobility sup A, transfers and amb CGA for safety secondary to reports of dizziness. Pt able to amb approx 25 ft using RW CGA with no reports of increased dizziness. Pt reports history of R hip pain during prolonged amb and difficulty with clearing R foot during amb but not noted during today's amb activity. Pt would benefit from skilled PT to address above deficits and promote optimal return to PLOF.      If plan is discharge home, recommend the following: A little help with walking and/or transfers;A little help with bathing/dressing/bathroom;Assistance with cooking/housework;Help with stairs or ramp for entrance;Assist for transportation   Can travel by private vehicle        Equipment Recommendations Rolling walker (2 wheels)  Recommendations for Other Services       Functional Status Assessment Patient has had a recent decline in their functional status and demonstrates the ability to make significant improvements in function in a reasonable and  predictable amount of time.     Precautions / Restrictions Precautions Precautions: Fall Precaution Comments: vertigo Restrictions Weight Bearing Restrictions: No      Mobility  Bed Mobility Overal bed mobility: Needs Assistance Bed Mobility: Supine to Sit     Supine to sit: HOB elevated, Used rails, Supervision     General bed mobility comments: able to perform bed mobility with no cuing required for hand placement; occasional cues for completion of task due to getting distracted talking    Transfers Overall transfer level: Needs assistance Equipment used: Rolling walker (2 wheels) Transfers: Sit to/from Stand Sit to Stand: Contact guard assist           General transfer comment: min cuing for AD management but otherwise CGA for safety    Ambulation/Gait Ambulation/Gait assistance: Contact guard assist Gait Distance (Feet): 25 Feet Assistive device: Rolling walker (2 wheels) Gait Pattern/deviations: Step-through pattern, Decreased stride length, Shuffle Gait velocity: decreased     General Gait Details: prior to amb Pt reports 5/10 modified Borg for dizziness describing "I feel like I'm swaying not the room"; slight shuffling step pattern; cuing for maintaining AD closer to trunk to promote safety; CGA for safety due to dizziness report  Stairs            Wheelchair Mobility     Tilt Bed    Modified Rankin (Stroke Patients Only)       Balance Overall balance assessment: Needs assistance, History of Falls Sitting-balance support: No upper extremity supported, Feet supported Sitting balance-Leahy Scale: Good Sitting balance - Comments: able to maintain seated  EOB balance during functional activities with no notable LOB   Standing balance support: Single extremity supported, During functional activity Standing balance-Leahy Scale: Good Standing balance comment: able to maintain static standing balance during functional activities with min swaying  noted                             Pertinent Vitals/Pain Pain Assessment Pain Assessment: No/denies pain (reports of dizziness 5/10 on modified Borg)    Home Living Family/patient expects to be discharged to:: Assisted living   Available Help at Discharge: Friend(s);Family;Personal care attendant;Available 24 hours/day Type of Home: Apartment           Home Equipment: Educational psychologist (4 wheels);Grab bars - toilet;Grab bars - tub/shower Additional Comments: ramp    Prior Function Prior Level of Function : History of Falls (last six months)             Mobility Comments: States falls last week and the past week, "trouble lifitng my feet up.       Extremity/Trunk Assessment   Upper Extremity Assessment Upper Extremity Assessment: Defer to OT evaluation;Generalized weakness    Lower Extremity Assessment Lower Extremity Assessment: Generalized weakness       Communication   Communication Communication: No apparent difficulties Cueing Techniques: Verbal cues  Cognition Arousal: Alert Behavior During Therapy: WFL for tasks assessed/performed Overall Cognitive Status: Within Functional Limits for tasks assessed                                 General Comments: pleasant and cooperative during therapy        General Comments      Exercises     Assessment/Plan    PT Assessment Patient needs continued PT services  PT Problem List Decreased strength;Decreased mobility;Decreased range of motion;Decreased activity tolerance;Decreased balance;Decreased cognition;Pain       PT Treatment Interventions DME instruction;Gait training;Functional mobility training;Therapeutic activities;Therapeutic exercise;Balance training;Neuromuscular re-education    PT Goals (Current goals can be found in the Care Plan section)  Acute Rehab PT Goals Patient Stated Goal: to get better PT Goal Formulation: With patient Time For Goal Achievement:  04/24/23 Potential to Achieve Goals: Good    Frequency Min 1X/week     Co-evaluation PT/OT/SLP Co-Evaluation/Treatment: Yes Reason for Co-Treatment: Necessary to address cognition/behavior during functional activity;To address functional/ADL transfers PT goals addressed during session: Mobility/safety with mobility;Balance;Proper use of DME;Strengthening/ROM         AM-PAC PT "6 Clicks" Mobility  Outcome Measure Help needed turning from your back to your side while in a flat bed without using bedrails?: None Help needed moving from lying on your back to sitting on the side of a flat bed without using bedrails?: None Help needed moving to and from a bed to a chair (including a wheelchair)?: A Little Help needed standing up from a chair using your arms (e.g., wheelchair or bedside chair)?: A Little Help needed to walk in hospital room?: A Little Help needed climbing 3-5 steps with a railing? : A Lot 6 Click Score: 19    End of Session Equipment Utilized During Treatment: Gait belt Activity Tolerance: Patient tolerated treatment well Patient left: in chair;with call bell/phone within reach;with chair alarm set Nurse Communication: Mobility status PT Visit Diagnosis: Unsteadiness on feet (R26.81);History of falling (Z91.81);Muscle weakness (generalized) (M62.81);Dizziness and giddiness (R42);Pain Pain - Right/Left: Right Pain - part of  body: Hip    Time: 1133-1200 PT Time Calculation (min) (ACUTE ONLY): 27 min   Charges:                 Elmon Else, SPT   Aleicia Kenagy 04/10/2023, 12:27 PM

## 2023-04-10 NOTE — Hospital Course (Signed)
72 year old female past medical history of chronic vertigo, neuromuscular weakness attributed to spinal stenosis, falls.  She got blurry vision both eyes and had a fall along the way.  And had some slurred speech.  CT scan showed a small age-indeterminate infarct in the anterior limb of the left internal capsule and a remote infarct in the right caudate head internal capsule.  9/27.  CT angiogram shows age-indeterminate infarct of the anterior limb of the left internal capsule.  No high-grade stenosis with head and neck imaging.

## 2023-04-10 NOTE — Plan of Care (Signed)
  Problem: Education: Goal: Knowledge of disease or condition will improve Outcome: Progressing Goal: Knowledge of secondary prevention will improve (MUST DOCUMENT ALL) Outcome: Progressing Goal: Knowledge of patient specific risk factors will improve Loraine Leriche N/A or DELETE if not current risk factor) Outcome: Progressing   Problem: Ischemic Stroke/TIA Tissue Perfusion: Goal: Complications of ischemic stroke/TIA will be minimized Outcome: Progressing   Problem: Coping: Goal: Will verbalize positive feelings about self Outcome: Progressing Goal: Will identify appropriate support needs Outcome: Progressing   Problem: Health Behavior/Discharge Planning: Goal: Goals will be collaboratively established with patient/family Outcome: Progressing   Problem: Self-Care: Goal: Ability to participate in self-care as condition permits will improve Outcome: Progressing Goal: Ability to communicate needs accurately will improve Outcome: Progressing   Problem: Nutrition: Goal: Risk of aspiration will decrease Outcome: Progressing Goal: Dietary intake will improve Outcome: Progressing   Problem: Clinical Measurements: Goal: Ability to maintain clinical measurements within normal limits will improve Outcome: Progressing Goal: Will remain free from infection Outcome: Progressing Goal: Diagnostic test results will improve Outcome: Progressing   Problem: Activity: Goal: Risk for activity intolerance will decrease Outcome: Progressing   Problem: Coping: Goal: Level of anxiety will decrease Outcome: Progressing

## 2023-04-10 NOTE — Progress Notes (Signed)
Speech Language Pathology Treatment: Cognitive-Linquistic  Patient Details Name: Kari Hughes MRN: [id number] DOB: June 16, 1951 Today's Date: 04/10/2023 Time: 1020-1040 SLP Time Calculation (min) (ACUTE ONLY): 20 min  Assessment / Plan / Recommendation Clinical Impression  Pt seen for further diagnostic and dynamic tx of cognitive-communication. Pt participated in further assessment of functional cognitive-linguistic ability via completion of SLUMS. 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 Pt would benefit from further cognitive-linguistic evaluation and tx post-acute with consideration for Neuropsychological testing as an outpatient as informal screening in acute care is non-diagnostic. Pt presents with s/sx cognitive-communication deficits including impaired attention, memory, problem solving, executive functioning, and insight. Observed difficulty with pt utilizing TV remote and call bell (improved with education). Mild dysarthria with persists; however, articulatory precision improved with moderate verbal cues for slow rate and increased vocal loudness. SLP to f/u per POC. Additional goals added to POC given completion of further testing.     HPI HPI: Pt is a  72 y.o. female who presented to ED on 04/08/23 s/p fall and c/o generalized weakess and dizziness for past 3 days. Pt  with a history of hypertension, bipolar disorder, and fibromyalgia. Head CT on admit, "1. Small age-indeterminate infarct in the anterior limb of the left  internal capsule. Consider brain MRI for further evaluation if  indicated.  2. Remote infarct in the right caudate head/internal capsule,  parenchymal volume loss, and background chronic small-vessel  ischemic change."      SLP Plan  Continue with current plan of care;Goals updated  Patient needs continued Speech Lanaguage Pathology Services   Recommendations for follow up therapy are one component of a multi-disciplinary discharge  planning process, led by the attending physician.  Recommendations may be updated based on patient status, additional functional criteria and insurance authorization.    Recommendations               Frequent or constant Supervision/Assistance Cognitive communication deficit (R41.841)     Continue with current plan of care;Goals updated    Clyde Canterbury, M.S., CCC-SLP Speech-Language Pathologist [facility] [phone] Arnette Felts)  Woodroe Chen  04/10/2023, 11:32 AM

## 2023-04-10 NOTE — Assessment & Plan Note (Signed)
Patient on Lyrica and gabapentin and baclofen.

## 2023-04-11 ENCOUNTER — Inpatient Hospital Stay (HOSPITAL_COMMUNITY)
Admit: 2023-04-11 | Discharge: 2023-04-11 | Disposition: A | Discharge status: Home / Self Care | Payer: Medicare HMO | Attending: Internal Medicine | Admitting: Internal Medicine

## 2023-04-11 ENCOUNTER — Inpatient Hospital Stay: Admit: 2023-04-11 | Payer: Medicare HMO

## 2023-04-11 DIAGNOSIS — M1990 Unspecified osteoarthritis, unspecified site: Secondary | ICD-10-CM | POA: Insufficient documentation

## 2023-04-11 DIAGNOSIS — W19XXXS Unspecified fall, sequela: Secondary | ICD-10-CM | POA: Diagnosis not present

## 2023-04-11 DIAGNOSIS — I10 Essential (primary) hypertension: Secondary | ICD-10-CM | POA: Diagnosis not present

## 2023-04-11 DIAGNOSIS — E785 Hyperlipidemia, unspecified: Secondary | ICD-10-CM | POA: Diagnosis not present

## 2023-04-11 DIAGNOSIS — I6389 Other cerebral infarction: Secondary | ICD-10-CM | POA: Diagnosis not present

## 2023-04-11 DIAGNOSIS — I639 Cerebral infarction, unspecified: Secondary | ICD-10-CM | POA: Diagnosis not present

## 2023-04-11 DIAGNOSIS — E559 Vitamin D deficiency, unspecified: Secondary | ICD-10-CM | POA: Insufficient documentation

## 2023-04-11 LAB — ECHOCARDIOGRAM COMPLETE
AR max vel: 2.31 cm2
AV Peak grad: 8.3 mm[Hg]
Ao pk vel: 1.44 m/s
Area-P 1/2: 4.21 cm2
Height: 62 in
S' Lateral: 2.7 cm
Weight: 2656 [oz_av]

## 2023-04-11 LAB — THYROID PANEL WITH TSH
Free Thyroxine Index: 1.2 (ref 1.2–4.9)
T3 Uptake Ratio: 20 % — ABNORMAL LOW (ref 24–39)
T4, Total: 5.9 ug/dL (ref 4.5–12.0)
TSH: 1.18 u[IU]/mL (ref 0.450–4.500)

## 2023-04-11 MED ORDER — AMLODIPINE BESYLATE 10 MG PO TABS
10.0000 mg | ORAL_TABLET | Freq: Once | ORAL | Status: AC
  Administered 2023-04-11: 10 mg via ORAL
  Filled 2023-04-11: qty 1

## 2023-04-11 MED ORDER — ATORVASTATIN CALCIUM 40 MG PO TABS: 40.0000 mg | ORAL_TABLET | Freq: Every day | ORAL | 0 refills | Status: AC

## 2023-04-11 MED ORDER — ASPIRIN 81 MG PO TBEC: 81.0000 mg | DELAYED_RELEASE_TABLET | Freq: Every day | ORAL | 0 refills | Status: AC

## 2023-04-11 MED ORDER — VITAMIN D (CHOLECALCIFEROL) 50 MCG (2000 UT) PO CAPS: 2000.0000 [IU] | ORAL_CAPSULE | Freq: Every day | ORAL | 0 refills | Status: AC

## 2023-04-11 NOTE — Assessment & Plan Note (Signed)
Continue aspirin 

## 2023-04-11 NOTE — Progress Notes (Signed)
SLP Cancellation Note  Patient Details Name: Kari Hughes MRN: [id number] DOB: 1950-08-13   Cancelled treatment:       Reason Eval/Treat Not Completed: Fatigue/lethargy limiting ability to participate (Pt politely requested to rest at this time. Will continue efforts as appropriate.)  Clyde Canterbury, M.S., CCC-SLP Speech-Language Pathologist [facility] [phone] Kari Hughes)  Woodroe Chen 04/11/2023, 11:35 AM

## 2023-04-11 NOTE — TOC Initial Note (Signed)
Transition of Care [facility]) - Initial/Assessment Note    Patient Details  Name: Kari Hughes MRN: [id number] Date of Birth: [birth year]  Transition of [facility]) CM/SW Contact:    Bing Quarry, RN Phone Number: 04/11/2023, 1:53 PM  Clinical Narrative:  04/11/23: Admitted 04/08/23 for vision changes, vertigo, recent falls with hip pain, slurred speech noted by neighbor. HTX of spinal stenosis, chronic vertigo, uses a walker at baseline. Patient has a pacemaker noted in provider notes.   TOC consult for discharge planning. Patient lives in an apartment complex, [place], which is set up for semi-senior attributes such a grab/safety bars, 911 pull cord in bathroom, but does not provider 24/7 care/supervision. Patient has 2 hours a day of PCS per sister. Sister is trying to get that extended to 4 hours but has to get PCP to sign off of Monday before that can occur.   Sister feels like patient could use 1-2 weeks in STR before going home. Patient has multiple co-morbidities and chronic vertigo as well as intermittent inability to lift her legs. She has had frequent falls at home which makes a safe discharge plan challenging. Sister works and lives in an apartment in [place], but has 17 steps to second floor apartment, but could not do that. Daughters both live in [place], so patient resides alone.   PT notes indicate patient has 24/7 supervision available but after speaking with sister that seems to not be available. Patient had some noted transportation issues earlier not being able to obtain medications from pharmacy in 08/2022 noted on last visit to cardiologist per neurology consult notes.  Discussed with sister and placed transportation resources in AVS via Care Coordination/patient instructions.      PT to revaluate in light of new information regarding 24/7 supervision is not available. Patient does express desire to go home. Sister working on extending PCP hours and full spectrum of  HH services including a SW would be ideal if returns home.      TOC to follow for discharge plan and final disposition needs.   Gabriel Cirri MSN RN CM  Transitions of Care Department [facility] [phone] Weekends Only         Expected Discharge Plan: Home w Home Health Services     Patient Goals and CMS Choice            Expected Discharge Plan and Services   Discharge Planning Services: CM Consult Post Acute Care Choice: NA Living arrangements for the past 2 months: Apartment                 DME Arranged: N/A DME Agency: NA                  Prior Living Arrangements/Services Living arrangements for the past 2 months: Apartment Lives with:: Self Patient language and need for interpreter reviewed:: No        Need for Family Participation in Patient Care: Yes (Comment) Care giver support system in place?: Yes (comment) (2 hours a day of PCS, needs 24/7 supervision due to falls.) Current home services: Other (comment) (2 hours of PCS) Criminal Activity/Legal Involvement Pertinent to Current Situation/Hospitalization: No - Comment as needed  Activities of Daily Living   ADL Screening (condition at time of admission) Does the patient have a NEW difficulty with bathing/dressing/toileting/self-feeding that is expected to last >3 days?: No Does the patient have a NEW difficulty with getting in/out of bed, walking, or climbing stairs that is expected to  last >3 days?: No Does the patient have a NEW difficulty with communication that is expected to last >3 days?: No Is the patient deaf or have difficulty hearing?: Yes Does the patient have difficulty seeing, even when wearing glasses/contacts?: Yes Does the patient have difficulty concentrating, remembering, or making decisions?: Yes  Permission Sought/Granted Permission sought to share information with : Case Manager                Emotional Assessment Appearance:: Appears stated  age Attitude/Demeanor/Rapport: Engaged Affect (typically observed): Accepting Orientation: : Oriented to Self, Oriented to  Time, Oriented to Situation Alcohol / Substance Use: Not Applicable Psych Involvement: No (comment)  Admission diagnosis:  Stroke (cerebrum) (HCC) [I63.9] Acute ischemic stroke [facility]) [I63.9] Patient Active Problem List   Diagnosis Date Noted   Falls 04/10/2023   Obesity (BMI 30-39.9) 04/10/2023   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   GAD (generalized anxiety disorder) 12/27/2020   Cannabis abuse, episodic use 12/27/2020   Lumbar degenerative disc disease 10/09/2020   Lumbar facet arthropathy 10/09/2020   Spinal stenosis 10/09/2020   Bipolar disorder (HCC) 07/12/2020   Marijuana use 07/12/2020   On anticoagulant therapy 07/12/2020   Nerve pain 07/12/2020   Right otitis media 07/03/2020   Pharyngitis 07/03/2020   MDD (major depressive disorder), recurrent episode, moderate (HCC)    Fibromyalgia    Chronic pain syndrome    Acute non-recurrent sinusitis 06/01/2020   Anxiety 06/01/2020   Encounter for medication refill 06/01/2020   No-show for appointment 05/28/2020   Chronic bilateral low back pain with bilateral sciatica 05/11/2020   Chronic joint pain 05/11/2020   Essential hypertension 05/11/2020   Change in vision 05/11/2020   Body mass index 26.0-26.9, adult 05/11/2020   Hyperlipidemia 05/10/2020   Pacemaker 05/08/2020   Lumbar radiculopathy 03/30/2020   PCP:  Enid Baas, MD Pharmacy:   [facility] [phone] [address], [place] - [address] [place] [address] Phone: [phone] Fax: [phone]  [facility] [address] [place] - [address] [place] [address] Phone: [phone] Fax: [phone]     Social Determinants of Health (SDOH) Social History: SDOH Screenings   Food Insecurity: No Food Insecurity (04/09/2023)  Housing: Patient Declined (04/09/2023)  Transportation  Needs: No Transportation Needs (04/09/2023)  Utilities: Not At Risk (04/09/2023)  Alcohol Screen: Low Risk  (05/08/2020)  Depression (PHQ2-9): Low Risk  (04/30/2022)  Financial Resource Strain: Low Risk  (03/09/2023)   Received from [facility]  Tobacco Use: High Risk (03/09/2023)   Received from [facility]   SDOH Interventions:     Readmission Risk Interventions     No data to display

## 2023-04-11 NOTE — Progress Notes (Signed)
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 PMH significant for sick sinus syndrome s/p pacer, diabetes, hypertension, hyperlipidemia, BMI 30.36, anxiety/depression/fibromyalgia, chronic pain syndrome, fibromyalgia, bipolar disorder, marijuana use, nerve pain, lumbar spinal stenosis with neurogenic claudication, B12 deficiency, peripheral vascular disease    PT Comments  Pt was side lying in bed with sister at bedside upon arrival. Mrs. Nguyen is A and O x 4 and agreeable to OOB activity. Discussed throughout session her previous symptoms of vertigo. She reports symptoms come and go. Throughout this session, pt did not endorse having any symptoms of dizziness, nausea, or unsteadiness. Pt did endorse recently missing ENT appointment made to discuss her recent vertigo like symptoms. Encouraged pt to follow up and reschedule appointment as soon as possible. She safely demonstrated abilities to exit bed, stand to RW, and ambulate 200 ft without LOB. Pt ambulated short distance in room without AD using furniture to simulate how she navigates her home environment where RW does not fit. Pt would benefit from continued skilled PT at DC to maximize her safety with ADLs. DC recs remain appropriate.     If plan is discharge home, recommend the following: A little help with walking and/or transfers;A little help with bathing/dressing/bathroom;Assistance with cooking/housework;Help with stairs or ramp for entrance;Assist for transportation     Equipment Recommendations  None recommended by PT (pt endorses having all equipment needs met)       Precautions /  Restrictions Precautions Precautions: Fall Precaution Comments: vertigo Restrictions Weight Bearing Restrictions: No     Mobility  Bed Mobility Overal bed mobility: Needs Assistance Bed Mobility: Supine to Sit, Sit to Supine  Supine to sit: Supervision Sit to supine: Supervision General bed mobility comments: no physical assistance required to exit bed or to re-enter after OOB activity    Transfers Overall transfer level: Needs assistance Equipment used: Rolling walker (2 wheels) Transfers: Sit to/from Stand Sit to Stand: Supervision  General transfer comment: Pt endorses having personal RW at home. Author encouraged use at DC    Ambulation/Gait Ambulation/Gait assistance: Supervision Gait Distance (Feet): 200 Feet Assistive device: Rolling walker (2 wheels) Gait Pattern/deviations: Step-through pattern, Decreased stride length, Shuffle Gait velocity: decreased  General Gait Details: pt was able to safely ambulate around unit with RW. Encouraged pt to continue to use her personal RW at home for fall prevention. In addition, recommended pt follow up with her ENT appointment she recently missed to discuss inner ear/vestibular deficits possibly being root of vertigo symptoms. No symptoms of vertigo throughout this session.    Balance Overall balance assessment: Needs assistance, History of Falls Sitting-balance support: No upper extremity supported, Feet supported Sitting balance-Leahy Scale: Normal     Standing balance support: Bilateral upper extremity supported, During functional activity, Reliant on assistive device for balance, Single extremity supported Standing balance-Leahy Scale: Good     Cognition Arousal: Alert Behavior During Therapy: WFL for tasks assessed/performed Overall Cognitive Status: Within Functional Limits for tasks assessed Area of Impairment: Safety/judgement, Problem solving    Safety/Judgement: Decreased awareness of safety, Decreased awareness  of deficits   Problem Solving: Slow processing, Requires verbal cues General Comments: Pt was side lying in bed upon arrival. Sister at bedside.  Pt is A and O x 4. Does not endorse symptoms of vertigo               Pertinent Vitals/Pain Pain Assessment Pain Assessment: No/denies pain     PT Goals (current goals can now be found in the care plan section) Acute Rehab PT Goals Patient Stated Goal: go home and have less falls Progress towards PT goals: Progressing toward goals    Frequency    Min 1X/week           Co-evaluation     PT goals addressed during session: Mobility/safety with mobility;Balance;Proper use of DME;Strengthening/ROM        AM-PAC PT "6 Clicks" Mobility   Outcome Measure  Help needed turning from your back to your side while in a flat bed without using bedrails?: None Help needed moving from lying on your back to sitting on the side of a flat bed without using bedrails?: None Help needed moving to and from a bed to a chair (including a wheelchair)?: A Little Help needed standing up from a chair using your arms (e.g., wheelchair or bedside chair)?: A Little Help needed to walk in hospital room?: A Little Help needed climbing 3-5 steps with a railing? : A Little 6 Click Score: 20    End of Session   Activity Tolerance: Patient tolerated treatment well Patient left: in bed;with call bell/phone within reach;with nursing/sitter in room Nurse Communication: Mobility status PT Visit Diagnosis: Unsteadiness on feet (R26.81);History of falling (Z91.81);Muscle weakness (generalized) (M62.81);Dizziness and giddiness (R42);Pain Pain - Right/Left: Right Pain - part of body: Hip     Time: 0204-0218 PT Time Calculation (min) (ACUTE ONLY): 14 min  Charges:    $Gait Training: 8-22 mins PT General Charges $$ ACUTE PT VISIT: 1 Visit                     Jetta Lout PTA 04/11/23, 3:08 PM

## 2023-04-11 NOTE — Plan of Care (Signed)
  Problem: Education: Goal: Knowledge of disease or condition will improve Outcome: Progressing   Problem: Ischemic Stroke/TIA Tissue Perfusion: Goal: Complications of ischemic stroke/TIA will be minimized Outcome: Progressing   Problem: Health Behavior/Discharge Planning: Goal: Ability to manage health-related needs will improve Outcome: Progressing   Problem: Education: Goal: Knowledge of General Education information will improve Description: Including pain rating scale, medication(s)/side effects and non-pharmacologic comfort measures Outcome: Progressing

## 2023-04-11 NOTE — Assessment & Plan Note (Signed)
Replace oral vitamin D

## 2023-04-11 NOTE — TOC Transition Note (Signed)
Transition of Care [facility]) - CM/SW Discharge Note   Patient Details  Name: Kari Hughes MRN: [id number] Date of Birth: [birth year]  Transition of Care [facility]) CM/SW Contact:  Bing Quarry, RN Phone Number: 04/11/2023, 4:35 PM   Clinical Narrative: 9/28: After PT re-evaluation and patient expressed wishes patient will be discharged to home with [facility] via [employer] for PT/OT/RN/SW. DME electric wheelchair ordered via [employer]. Progress note for WC co-signed for [employer] authorization. Sister to transport home on discharge.   Gabriel Cirri MSN RN CM  Transitions of Care Department [facility] [phone] Weekends Only     Final next level of care: Home w Home Health Services Barriers to Discharge: Barriers Resolved   Patient Goals and CMS Choice   Choice offered to / list presented to : Patient  Discharge Placement                         Discharge Plan and Services Additional resources added to the After Visit Summary for     Discharge Planning Services: CM Consult Post Acute Care Choice: NA          DME Arranged: Physiological scientist DME Agency: [employer] Date DME Agency Contacted: 04/11/23 Time DME Agency Contacted: [phone]   HH Arranged: RN, PT, OT, Social Work [employer] Agency: [employer] [facility] Date HH Agency Contacted: 04/11/23 Time HH Agency Contacted: [phone] Representative spoke with at [facility] Agency: Barnetta Chapel  Social Determinants of Health (SDOH) Interventions SDOH Screenings   Food Insecurity: No Food Insecurity (04/09/2023)  Housing: Patient Declined (04/09/2023)  Transportation Needs: No Transportation Needs (04/09/2023)  Utilities: Not At Risk (04/09/2023)  Alcohol Screen: Low Risk  (05/08/2020)  Depression (PHQ2-9): Low Risk  (04/30/2022)  Financial Resource Strain: Low Risk  (03/09/2023)   Received from [facility]  Tobacco Use: High Risk (03/09/2023)   Received from [facility]     Readmission Risk  Interventions     No data to display

## 2023-04-11 NOTE — Progress Notes (Signed)
  Echocardiogram 2D Echocardiogram has been performed.  Kari Hughes 04/11/2023, 11:01 AM

## 2023-04-11 NOTE — TOC CM/SW Note (Signed)
Transition of Care [facility]) - Inpatient Brief Assessment   Patient Details  Name: Kari Hughes MRN: [id number] Date of Birth: 27-Oct-1950  Transition of Care [facility]) CM/SW Contact:    Bing Quarry, RN Phone Number: 04/11/2023, 1:46 PM   Clinical Narrative:  04/11/23: Brief assessment.   Gabriel Cirri MSN RN CM  Transitions of Care Department [facility] [phone] Weekends Only   Transition of Care Asessment: Insurance and Status: Insurance coverage has been reviewed Patient has primary care physician: Yes Home environment has been reviewed: [place]ment [place] [employer] up for seniors with pull light, elevator Does not have 24 hour acces to help/care. Prior level of function:: Htx of frequent falls d/t  states she also has intermitten issues lifitng up her feet.   Gabriel Cirri MSN RN CM  Transitions of Care Department [facility] [phone] Weekends Only    Social Determinants of Health Reivew: SDOH reviewed needs interventions Readmission risk has been reviewed: No (Calculated Risk is 10%) Transition of care needs: transition of care needs identified, TOC will continue to follow

## 2023-04-11 NOTE — Progress Notes (Signed)
    Durable Medical Equipment  (From admission, onward)           Start     Ordered   04/11/23 1523  For home use only DME Wheelchair electric  Once       Comments: With seat cushion for back and buttock   04/11/23 1523          Patient has htx of chronic vertigo, recent falls, neuromuscular weakness d/t spinal stenosis, recent visual changes all creating high risk for falls and need for electric wheelchair to assist with self care and ADL's, and prevent further falls.

## 2023-04-11 NOTE — Discharge Summary (Addendum)
Physician Discharge Summary   Patient: Kari Hughes MRN: [id number] DOB: Jun 10, 1951  Admit date:     04/09/2023  Discharge date: 04/11/23  Discharge Physician: Alford Highland   PCP: Enid Baas, MD   Recommendations at discharge:   Follow-up PCP 5 days Follow-up Dr. Lalla Brothers cardiology in 2 weeks Follow-up ENT 3 weeks  Discharge Diagnoses: Principal Problem:   Stroke (cerebrum) (HCC) Active Problems:   Essential hypertension   Falls   Hyperlipidemia   Obesity (BMI 30-39.9)   Spinal stenosis   Arthritis   Vitamin D deficiency    Hospital Course: 72 year old female past medical history of chronic vertigo, neuromuscular weakness attributed to spinal stenosis, falls.  She got blurry vision both eyes and had a fall along the way.  And had some slurred speech.  CT scan showed a small age-indeterminate infarct in the anterior limb of the left internal capsule and a remote infarct in the right caudate head internal capsule.  9/27.  CT angiogram shows age-indeterminate infarct of the anterior limb of the left internal capsule.  No high-grade stenosis with head and neck imaging.  Placed a call to [employer] for pacer interrogation. 9/28.  Echocardiogram did not show any source of stroke.  Patient ambulated 200 feet with physical therapy.  Physical therapy recommending home health.  Patient's sister concerned about safety at home because she is by herself.  Patient interested in a motorized scooter because she cannot use her hands to maneuver her wheelchair. Spoke with neurology interrogation of pacer can be done as outpatient (since over 24hrs since I called).   Assessment and Plan: * Stroke (cerebrum) [facility]) Neurology recommended aspirin and Lipitor.  Echocardiogram does not show a source of stroke.  Spoke with neurology about the interrogation of pacemaker and she said it was okay to do as outpatient.  Will refer to Dr. Lalla Brothers as outpatient.  Physical therapy recommending home  health.  Can consider an MRI as outpatient that is compatible with pacemaker.  Essential hypertension Continue losartan, restarted Norvasc today and for discharge.  Falls Physical therapy recommending home health.  Looking back at old labs vitamin D was low back in April as outpatient.  Will replace vitamin D.  Patient walked 200 feet with physical therapy.  Patient interested in a motorized wheelchair for when she has her vertigo episodes.  She cannot maneuver in a manual wheelchair secondary to arthritis in her hands and neuromuscular weakness.  Hyperlipidemia LDL 116 and goal less than 70.  On Lipitor.  Obesity (BMI 30-39.9) BMI 30.36  Vitamin D deficiency Replace oral vitamin D  Arthritis Continue aspirin  Spinal stenosis Patient gabapentin.  Will hold off on baclofen         Consultants: Neurology Procedures performed: None Disposition: Home health Diet recommendation:  Cardiac diet DISCHARGE MEDICATION: Allergies as of 04/11/2023   No Known Allergies      Medication List     STOP taking these medications    aspirin 81 MG chewable tablet Replaced by: aspirin EC 81 MG tablet   baclofen 10 MG tablet Commonly known as: LIORESAL   naproxen 500 MG tablet Commonly known as: NAPROSYN   pregabalin 25 MG capsule Commonly known as: Lyrica       TAKE these medications    acetaminophen 500 MG tablet Commonly known as: TYLENOL Take 1,000 mg by mouth every 6 (six) hours as needed.   albuterol 108 (90 Base) MCG/ACT inhaler Commonly known as: VENTOLIN HFA Inhale 2 puffs into the lungs every  Physician Discharge Summary   Patient: Kari Hughes MRN: [id number] DOB: Jun 10, 1951  Admit date:     04/09/2023  Discharge date: 04/11/23  Discharge Physician: Alford Highland   PCP: Enid Baas, MD   Recommendations at discharge:   Follow-up PCP 5 days Follow-up Dr. Lalla Brothers cardiology in 2 weeks Follow-up ENT 3 weeks  Discharge Diagnoses: Principal Problem:   Stroke (cerebrum) (HCC) Active Problems:   Essential hypertension   Falls   Hyperlipidemia   Obesity (BMI 30-39.9)   Spinal stenosis   Arthritis   Vitamin D deficiency    Hospital Course: 72 year old female past medical history of chronic vertigo, neuromuscular weakness attributed to spinal stenosis, falls.  She got blurry vision both eyes and had a fall along the way.  And had some slurred speech.  CT scan showed a small age-indeterminate infarct in the anterior limb of the left internal capsule and a remote infarct in the right caudate head internal capsule.  9/27.  CT angiogram shows age-indeterminate infarct of the anterior limb of the left internal capsule.  No high-grade stenosis with head and neck imaging.  Placed a call to [employer] for pacer interrogation. 9/28.  Echocardiogram did not show any source of stroke.  Patient ambulated 200 feet with physical therapy.  Physical therapy recommending home health.  Patient's sister concerned about safety at home because she is by herself.  Patient interested in a motorized scooter because she cannot use her hands to maneuver her wheelchair. Spoke with neurology interrogation of pacer can be done as outpatient (since over 24hrs since I called).   Assessment and Plan: * Stroke (cerebrum) [facility]) Neurology recommended aspirin and Lipitor.  Echocardiogram does not show a source of stroke.  Spoke with neurology about the interrogation of pacemaker and she said it was okay to do as outpatient.  Will refer to Dr. Lalla Brothers as outpatient.  Physical therapy recommending home  health.  Can consider an MRI as outpatient that is compatible with pacemaker.  Essential hypertension Continue losartan, restarted Norvasc today and for discharge.  Falls Physical therapy recommending home health.  Looking back at old labs vitamin D was low back in April as outpatient.  Will replace vitamin D.  Patient walked 200 feet with physical therapy.  Patient interested in a motorized wheelchair for when she has her vertigo episodes.  She cannot maneuver in a manual wheelchair secondary to arthritis in her hands and neuromuscular weakness.  Hyperlipidemia LDL 116 and goal less than 70.  On Lipitor.  Obesity (BMI 30-39.9) BMI 30.36  Vitamin D deficiency Replace oral vitamin D  Arthritis Continue aspirin  Spinal stenosis Patient gabapentin.  Will hold off on baclofen         Consultants: Neurology Procedures performed: None Disposition: Home health Diet recommendation:  Cardiac diet DISCHARGE MEDICATION: Allergies as of 04/11/2023   No Known Allergies      Medication List     STOP taking these medications    aspirin 81 MG chewable tablet Replaced by: aspirin EC 81 MG tablet   baclofen 10 MG tablet Commonly known as: LIORESAL   naproxen 500 MG tablet Commonly known as: NAPROSYN   pregabalin 25 MG capsule Commonly known as: Lyrica       TAKE these medications    acetaminophen 500 MG tablet Commonly known as: TYLENOL Take 1,000 mg by mouth every 6 (six) hours as needed.   albuterol 108 (90 Base) MCG/ACT inhaler Commonly known as: VENTOLIN HFA Inhale 2 puffs into the lungs every  lb Date of Birth:  [birth year]        BSA:          1.766 m Patient Age:    72 years        BP:           135/71 mmHg Patient Gender: F               HR:           81 bpm. Exam Location:  [facility] Procedure: 2D Echo Indications:     Stroke I63.9  History:         Patient has no prior history of Echocardiogram examinations.  Sonographer:     Overton Mam RDCS, FASE Referring Phys:  [id number] Alford Highland Diagnosing Phys: Armanda Magic MD  Sonographer Comments: Technically challenging study due to patient having discomfort when test was being performed, chest wall very sensitive. IMPRESSIONS  1. Left ventricular ejection fraction, by estimation, is 60 to 65%. The left ventricle has normal function. The left ventricle has no regional wall motion abnormalities. There is moderate left ventricular hypertrophy of the basal-septal segment. Left ventricular diastolic parameters are consistent with Grade I diastolic dysfunction (impaired relaxation).  2. Right ventricular systolic function is normal. The right ventricular size is normal.  3. The mitral valve is normal in structure. Trivial mitral valve regurgitation. No evidence of mitral stenosis.  4. The aortic valve is normal in structure. Aortic valve regurgitation is not visualized. No aortic stenosis is present.  5. The inferior vena cava is normal in size with greater  than 50% respiratory variability, suggesting right atrial pressure of 3 mmHg. Conclusion(s)/Recommendation(s): No intracardiac source of embolism detected on this transthoracic study. Consider a transesophageal echocardiogram to exclude cardiac source of embolism if clinically indicated. FINDINGS  Left Ventricle: Left ventricular ejection fraction, by estimation, is 60 to 65%. The left ventricle has normal function. The left ventricle has no regional wall motion abnormalities. The left ventricular internal cavity size was normal in size. There is  moderate left ventricular hypertrophy of the basal-septal segment. Left ventricular diastolic parameters are consistent with Grade I diastolic dysfunction (impaired relaxation). Normal left ventricular filling pressure. Right Ventricle: The right ventricular size is normal. No increase in right ventricular wall thickness. Right ventricular systolic function is normal. Left Atrium: Left atrial size was normal in size. Right Atrium: Right atrial size was normal in size. Pericardium: There is no evidence of pericardial effusion. Mitral Valve: The mitral valve is normal in structure. Trivial mitral valve regurgitation. No evidence of mitral valve stenosis. Tricuspid Valve: The tricuspid valve is normal in structure. Tricuspid valve regurgitation is not demonstrated. No evidence of tricuspid stenosis. Aortic Valve: The aortic valve is normal in structure. Aortic valve regurgitation is not visualized. No aortic stenosis is present. Aortic valve peak gradient measures 8.3 mmHg. Pulmonic Valve: The pulmonic valve was normal in structure. Pulmonic valve regurgitation is not visualized. No evidence of pulmonic stenosis. Aorta: The aortic root is normal in size and structure. Venous: The inferior vena cava is normal in size with greater than 50% respiratory variability, suggesting right atrial pressure of 3 mmHg. IAS/Shunts: No atrial level shunt detected by color flow Doppler.  Additional Comments: A device lead is visualized.  LEFT VENTRICLE PLAX 2D LVIDd:         4.50 cm   Diastology LVIDs:         2.70 cm   LV e' medial:    5.55 cm/s LV PW:  Physician Discharge Summary   Patient: Kari Hughes MRN: [id number] DOB: Jun 10, 1951  Admit date:     04/09/2023  Discharge date: 04/11/23  Discharge Physician: Alford Highland   PCP: Enid Baas, MD   Recommendations at discharge:   Follow-up PCP 5 days Follow-up Dr. Lalla Brothers cardiology in 2 weeks Follow-up ENT 3 weeks  Discharge Diagnoses: Principal Problem:   Stroke (cerebrum) (HCC) Active Problems:   Essential hypertension   Falls   Hyperlipidemia   Obesity (BMI 30-39.9)   Spinal stenosis   Arthritis   Vitamin D deficiency    Hospital Course: 72 year old female past medical history of chronic vertigo, neuromuscular weakness attributed to spinal stenosis, falls.  She got blurry vision both eyes and had a fall along the way.  And had some slurred speech.  CT scan showed a small age-indeterminate infarct in the anterior limb of the left internal capsule and a remote infarct in the right caudate head internal capsule.  9/27.  CT angiogram shows age-indeterminate infarct of the anterior limb of the left internal capsule.  No high-grade stenosis with head and neck imaging.  Placed a call to [employer] for pacer interrogation. 9/28.  Echocardiogram did not show any source of stroke.  Patient ambulated 200 feet with physical therapy.  Physical therapy recommending home health.  Patient's sister concerned about safety at home because she is by herself.  Patient interested in a motorized scooter because she cannot use her hands to maneuver her wheelchair. Spoke with neurology interrogation of pacer can be done as outpatient (since over 24hrs since I called).   Assessment and Plan: * Stroke (cerebrum) [facility]) Neurology recommended aspirin and Lipitor.  Echocardiogram does not show a source of stroke.  Spoke with neurology about the interrogation of pacemaker and she said it was okay to do as outpatient.  Will refer to Dr. Lalla Brothers as outpatient.  Physical therapy recommending home  health.  Can consider an MRI as outpatient that is compatible with pacemaker.  Essential hypertension Continue losartan, restarted Norvasc today and for discharge.  Falls Physical therapy recommending home health.  Looking back at old labs vitamin D was low back in April as outpatient.  Will replace vitamin D.  Patient walked 200 feet with physical therapy.  Patient interested in a motorized wheelchair for when she has her vertigo episodes.  She cannot maneuver in a manual wheelchair secondary to arthritis in her hands and neuromuscular weakness.  Hyperlipidemia LDL 116 and goal less than 70.  On Lipitor.  Obesity (BMI 30-39.9) BMI 30.36  Vitamin D deficiency Replace oral vitamin D  Arthritis Continue aspirin  Spinal stenosis Patient gabapentin.  Will hold off on baclofen         Consultants: Neurology Procedures performed: None Disposition: Home health Diet recommendation:  Cardiac diet DISCHARGE MEDICATION: Allergies as of 04/11/2023   No Known Allergies      Medication List     STOP taking these medications    aspirin 81 MG chewable tablet Replaced by: aspirin EC 81 MG tablet   baclofen 10 MG tablet Commonly known as: LIORESAL   naproxen 500 MG tablet Commonly known as: NAPROSYN   pregabalin 25 MG capsule Commonly known as: Lyrica       TAKE these medications    acetaminophen 500 MG tablet Commonly known as: TYLENOL Take 1,000 mg by mouth every 6 (six) hours as needed.   albuterol 108 (90 Base) MCG/ACT inhaler Commonly known as: VENTOLIN HFA Inhale 2 puffs into the lungs every  Physician Discharge Summary   Patient: Kari Hughes MRN: [id number] DOB: Jun 10, 1951  Admit date:     04/09/2023  Discharge date: 04/11/23  Discharge Physician: Alford Highland   PCP: Enid Baas, MD   Recommendations at discharge:   Follow-up PCP 5 days Follow-up Dr. Lalla Brothers cardiology in 2 weeks Follow-up ENT 3 weeks  Discharge Diagnoses: Principal Problem:   Stroke (cerebrum) (HCC) Active Problems:   Essential hypertension   Falls   Hyperlipidemia   Obesity (BMI 30-39.9)   Spinal stenosis   Arthritis   Vitamin D deficiency    Hospital Course: 72 year old female past medical history of chronic vertigo, neuromuscular weakness attributed to spinal stenosis, falls.  She got blurry vision both eyes and had a fall along the way.  And had some slurred speech.  CT scan showed a small age-indeterminate infarct in the anterior limb of the left internal capsule and a remote infarct in the right caudate head internal capsule.  9/27.  CT angiogram shows age-indeterminate infarct of the anterior limb of the left internal capsule.  No high-grade stenosis with head and neck imaging.  Placed a call to [employer] for pacer interrogation. 9/28.  Echocardiogram did not show any source of stroke.  Patient ambulated 200 feet with physical therapy.  Physical therapy recommending home health.  Patient's sister concerned about safety at home because she is by herself.  Patient interested in a motorized scooter because she cannot use her hands to maneuver her wheelchair. Spoke with neurology interrogation of pacer can be done as outpatient (since over 24hrs since I called).   Assessment and Plan: * Stroke (cerebrum) [facility]) Neurology recommended aspirin and Lipitor.  Echocardiogram does not show a source of stroke.  Spoke with neurology about the interrogation of pacemaker and she said it was okay to do as outpatient.  Will refer to Dr. Lalla Brothers as outpatient.  Physical therapy recommending home  health.  Can consider an MRI as outpatient that is compatible with pacemaker.  Essential hypertension Continue losartan, restarted Norvasc today and for discharge.  Falls Physical therapy recommending home health.  Looking back at old labs vitamin D was low back in April as outpatient.  Will replace vitamin D.  Patient walked 200 feet with physical therapy.  Patient interested in a motorized wheelchair for when she has her vertigo episodes.  She cannot maneuver in a manual wheelchair secondary to arthritis in her hands and neuromuscular weakness.  Hyperlipidemia LDL 116 and goal less than 70.  On Lipitor.  Obesity (BMI 30-39.9) BMI 30.36  Vitamin D deficiency Replace oral vitamin D  Arthritis Continue aspirin  Spinal stenosis Patient gabapentin.  Will hold off on baclofen         Consultants: Neurology Procedures performed: None Disposition: Home health Diet recommendation:  Cardiac diet DISCHARGE MEDICATION: Allergies as of 04/11/2023   No Known Allergies      Medication List     STOP taking these medications    aspirin 81 MG chewable tablet Replaced by: aspirin EC 81 MG tablet   baclofen 10 MG tablet Commonly known as: LIORESAL   naproxen 500 MG tablet Commonly known as: NAPROSYN   pregabalin 25 MG capsule Commonly known as: Lyrica       TAKE these medications    acetaminophen 500 MG tablet Commonly known as: TYLENOL Take 1,000 mg by mouth every 6 (six) hours as needed.   albuterol 108 (90 Base) MCG/ACT inhaler Commonly known as: VENTOLIN HFA Inhale 2 puffs into the lungs every  Physician Discharge Summary   Patient: Kari Hughes MRN: [id number] DOB: Jun 10, 1951  Admit date:     04/09/2023  Discharge date: 04/11/23  Discharge Physician: Alford Highland   PCP: Enid Baas, MD   Recommendations at discharge:   Follow-up PCP 5 days Follow-up Dr. Lalla Brothers cardiology in 2 weeks Follow-up ENT 3 weeks  Discharge Diagnoses: Principal Problem:   Stroke (cerebrum) (HCC) Active Problems:   Essential hypertension   Falls   Hyperlipidemia   Obesity (BMI 30-39.9)   Spinal stenosis   Arthritis   Vitamin D deficiency    Hospital Course: 72 year old female past medical history of chronic vertigo, neuromuscular weakness attributed to spinal stenosis, falls.  She got blurry vision both eyes and had a fall along the way.  And had some slurred speech.  CT scan showed a small age-indeterminate infarct in the anterior limb of the left internal capsule and a remote infarct in the right caudate head internal capsule.  9/27.  CT angiogram shows age-indeterminate infarct of the anterior limb of the left internal capsule.  No high-grade stenosis with head and neck imaging.  Placed a call to [employer] for pacer interrogation. 9/28.  Echocardiogram did not show any source of stroke.  Patient ambulated 200 feet with physical therapy.  Physical therapy recommending home health.  Patient's sister concerned about safety at home because she is by herself.  Patient interested in a motorized scooter because she cannot use her hands to maneuver her wheelchair. Spoke with neurology interrogation of pacer can be done as outpatient (since over 24hrs since I called).   Assessment and Plan: * Stroke (cerebrum) [facility]) Neurology recommended aspirin and Lipitor.  Echocardiogram does not show a source of stroke.  Spoke with neurology about the interrogation of pacemaker and she said it was okay to do as outpatient.  Will refer to Dr. Lalla Brothers as outpatient.  Physical therapy recommending home  health.  Can consider an MRI as outpatient that is compatible with pacemaker.  Essential hypertension Continue losartan, restarted Norvasc today and for discharge.  Falls Physical therapy recommending home health.  Looking back at old labs vitamin D was low back in April as outpatient.  Will replace vitamin D.  Patient walked 200 feet with physical therapy.  Patient interested in a motorized wheelchair for when she has her vertigo episodes.  She cannot maneuver in a manual wheelchair secondary to arthritis in her hands and neuromuscular weakness.  Hyperlipidemia LDL 116 and goal less than 70.  On Lipitor.  Obesity (BMI 30-39.9) BMI 30.36  Vitamin D deficiency Replace oral vitamin D  Arthritis Continue aspirin  Spinal stenosis Patient gabapentin.  Will hold off on baclofen         Consultants: Neurology Procedures performed: None Disposition: Home health Diet recommendation:  Cardiac diet DISCHARGE MEDICATION: Allergies as of 04/11/2023   No Known Allergies      Medication List     STOP taking these medications    aspirin 81 MG chewable tablet Replaced by: aspirin EC 81 MG tablet   baclofen 10 MG tablet Commonly known as: LIORESAL   naproxen 500 MG tablet Commonly known as: NAPROSYN   pregabalin 25 MG capsule Commonly known as: Lyrica       TAKE these medications    acetaminophen 500 MG tablet Commonly known as: TYLENOL Take 1,000 mg by mouth every 6 (six) hours as needed.   albuterol 108 (90 Base) MCG/ACT inhaler Commonly known as: VENTOLIN HFA Inhale 2 puffs into the lungs every  lb Date of Birth:  [birth year]        BSA:          1.766 m Patient Age:    72 years        BP:           135/71 mmHg Patient Gender: F               HR:           81 bpm. Exam Location:  [facility] Procedure: 2D Echo Indications:     Stroke I63.9  History:         Patient has no prior history of Echocardiogram examinations.  Sonographer:     Overton Mam RDCS, FASE Referring Phys:  [id number] Alford Highland Diagnosing Phys: Armanda Magic MD  Sonographer Comments: Technically challenging study due to patient having discomfort when test was being performed, chest wall very sensitive. IMPRESSIONS  1. Left ventricular ejection fraction, by estimation, is 60 to 65%. The left ventricle has normal function. The left ventricle has no regional wall motion abnormalities. There is moderate left ventricular hypertrophy of the basal-septal segment. Left ventricular diastolic parameters are consistent with Grade I diastolic dysfunction (impaired relaxation).  2. Right ventricular systolic function is normal. The right ventricular size is normal.  3. The mitral valve is normal in structure. Trivial mitral valve regurgitation. No evidence of mitral stenosis.  4. The aortic valve is normal in structure. Aortic valve regurgitation is not visualized. No aortic stenosis is present.  5. The inferior vena cava is normal in size with greater  than 50% respiratory variability, suggesting right atrial pressure of 3 mmHg. Conclusion(s)/Recommendation(s): No intracardiac source of embolism detected on this transthoracic study. Consider a transesophageal echocardiogram to exclude cardiac source of embolism if clinically indicated. FINDINGS  Left Ventricle: Left ventricular ejection fraction, by estimation, is 60 to 65%. The left ventricle has normal function. The left ventricle has no regional wall motion abnormalities. The left ventricular internal cavity size was normal in size. There is  moderate left ventricular hypertrophy of the basal-septal segment. Left ventricular diastolic parameters are consistent with Grade I diastolic dysfunction (impaired relaxation). Normal left ventricular filling pressure. Right Ventricle: The right ventricular size is normal. No increase in right ventricular wall thickness. Right ventricular systolic function is normal. Left Atrium: Left atrial size was normal in size. Right Atrium: Right atrial size was normal in size. Pericardium: There is no evidence of pericardial effusion. Mitral Valve: The mitral valve is normal in structure. Trivial mitral valve regurgitation. No evidence of mitral valve stenosis. Tricuspid Valve: The tricuspid valve is normal in structure. Tricuspid valve regurgitation is not demonstrated. No evidence of tricuspid stenosis. Aortic Valve: The aortic valve is normal in structure. Aortic valve regurgitation is not visualized. No aortic stenosis is present. Aortic valve peak gradient measures 8.3 mmHg. Pulmonic Valve: The pulmonic valve was normal in structure. Pulmonic valve regurgitation is not visualized. No evidence of pulmonic stenosis. Aorta: The aortic root is normal in size and structure. Venous: The inferior vena cava is normal in size with greater than 50% respiratory variability, suggesting right atrial pressure of 3 mmHg. IAS/Shunts: No atrial level shunt detected by color flow Doppler.  Additional Comments: A device lead is visualized.  LEFT VENTRICLE PLAX 2D LVIDd:         4.50 cm   Diastology LVIDs:         2.70 cm   LV e' medial:    5.55 cm/s LV PW:

## 2023-04-28 ENCOUNTER — Telehealth: Payer: Self-pay

## 2023-04-28 NOTE — Telephone Encounter (Signed)
No answer no voice mail  

## 2023-04-28 NOTE — Telephone Encounter (Signed)
No messages received for at least 21 days Last message received 21 days ago. The patient will be deactivated in 69 days.

## 2023-05-01 NOTE — Telephone Encounter (Signed)
No answer no voice mail  

## 2023-05-06 NOTE — Telephone Encounter (Signed)
Letter sent 05/06/2023

## 2023-05-12 ENCOUNTER — Telehealth: Payer: Self-pay

## 2023-06-17 ENCOUNTER — Other Ambulatory Visit: Payer: Self-pay

## 2023-06-17 ENCOUNTER — Emergency Department: Payer: Medicare HMO

## 2023-06-17 ENCOUNTER — Emergency Department
Admission: EM | Admit: 2023-06-17 | Discharge: 2023-06-17 | Disposition: A | Discharge status: Home / Self Care | Payer: Medicare HMO | Attending: Emergency Medicine | Admitting: Emergency Medicine

## 2023-06-17 DIAGNOSIS — Z79899 Other long term (current) drug therapy: Secondary | ICD-10-CM | POA: Insufficient documentation

## 2023-06-17 DIAGNOSIS — Z7982 Long term (current) use of aspirin: Secondary | ICD-10-CM | POA: Diagnosis not present

## 2023-06-17 DIAGNOSIS — Z96698 Presence of other orthopedic joint implants: Secondary | ICD-10-CM | POA: Insufficient documentation

## 2023-06-17 DIAGNOSIS — I10 Essential (primary) hypertension: Secondary | ICD-10-CM | POA: Insufficient documentation

## 2023-06-17 DIAGNOSIS — Z950 Presence of cardiac pacemaker: Secondary | ICD-10-CM | POA: Diagnosis not present

## 2023-06-17 DIAGNOSIS — J45909 Unspecified asthma, uncomplicated: Secondary | ICD-10-CM | POA: Insufficient documentation

## 2023-06-17 DIAGNOSIS — M436 Torticollis: Secondary | ICD-10-CM | POA: Insufficient documentation

## 2023-06-17 DIAGNOSIS — M542 Cervicalgia: Secondary | ICD-10-CM | POA: Diagnosis present

## 2023-06-17 MED ORDER — HYDROCODONE-ACETAMINOPHEN 5-325 MG PO TABS
1.0000 | ORAL_TABLET | Freq: Once | ORAL | Status: AC
  Administered 2023-06-17: 1 via ORAL
  Filled 2023-06-17: qty 1

## 2023-06-17 MED ORDER — LIDOCAINE 5 % EX PTCH
1.0000 | MEDICATED_PATCH | Freq: Once | CUTANEOUS | Status: DC
  Administered 2023-06-17: 1 via TRANSDERMAL
  Filled 2023-06-17: qty 1

## 2023-06-17 MED ORDER — HYDROCODONE-ACETAMINOPHEN 5-325 MG PO TABS
1.0000 | ORAL_TABLET | Freq: Four times a day (QID) | ORAL | 0 refills | Status: DC | PRN
Start: 2023-06-17 — End: 2023-09-03

## 2023-06-17 MED ORDER — LIDOCAINE 5 % EX PTCH: 1.0000 | MEDICATED_PATCH | Freq: Two times a day (BID) | CUTANEOUS | 0 refills | Status: AC

## 2023-06-17 NOTE — ED Triage Notes (Signed)
Pt presents to ER via ems from home with c/o neck pain that started 2 days ago.  Pt denies any injuries, but states she may have slept on it the wrong way.  States pain is worse when turning head or moving head up and down.  Pt denies radiation of pain at this time.  Pt otherwise A&O x4 and in [place] at this time.

## 2023-06-17 NOTE — ED Triage Notes (Signed)
EMS brings pt in from home for c/o neck pain that began yesterday; denies any injury

## 2023-06-17 NOTE — ED Notes (Signed)
Pt up to commode with walker, pt states her sister will pick her up, but she does not drive in dark.

## 2023-06-17 NOTE — Discharge Instructions (Addendum)

## 2023-06-17 NOTE — ED Provider Notes (Signed)
[facility] Provider Note    Event Date/Time   First MD Initiated Contact with Patient 06/17/23 0423     (approximate)   History   Neck Pain   HPI  Kari Hughes is a 72 y.o. female with history of hypertension, stroke, lumbar spinal stenosis, chronic vertigo who presents to the emergency department with complaints of posterior left-sided neck pain.  States she thinks she must of slept on her neck wrong.  Denies any injury.  States it is painful to bend her neck and turn it right and left.  No numbness, tingling, weakness.  Is able to ambulate with a walker but she is her baseline.  No bowel or bladder incontinence.  No recent back surgeries, epidural injection.  No fever.   History provided by patient.    Past Medical History:  Diagnosis Date   Asthma    Depression    Hypertension    Hypertension 2002   Spinal stenosis 2012   Vertigo     Past Surgical History:  Procedure Laterality Date   JOINT REPLACEMENT Left 2013   PACEMAKER PLACEMENT  2019    MEDICATIONS:  Prior to Admission medications   Medication Sig Start Date End Date Taking? Authorizing Provider  acetaminophen (TYLENOL) 500 MG tablet Take 1,000 mg by mouth every 6 (six) hours as needed.    [provider]  albuterol (VENTOLIN HFA) 108 (90 Base) MCG/ACT inhaler Inhale 2 puffs into the lungs every 6 (six) hours as needed for shortness of breath or wheezing. 03/27/22 12/18/23  [provider]  amLODipine (NORVASC) 10 MG tablet Take 1 tablet by mouth daily. 03/31/23   [provider]  aspirin EC 81 MG tablet Take 1 tablet (81 mg total) by mouth daily. Swallow whole. 04/12/23   Alford Highland, MD  atorvastatin (LIPITOR) 40 MG tablet Take 1 tablet (40 mg total) by mouth daily. 04/12/23   Alford Highland, MD  Cholecalciferol (VITAMIN D3) 50 MCG (2000 UT) CAPS Take 1 capsule (2,000 Units total) by mouth daily. 04/11/23   Alford Highland, MD  gabapentin (NEURONTIN)  600 MG tablet Take 600 mg by mouth 3 (three) times daily. 03/09/23   [provider]  loratadine (CLARITIN) 10 MG tablet Take 10 mg by mouth daily. 03/27/22   [provider]  losartan (COZAAR) 50 MG tablet Take 1 tablet by mouth daily. 09/05/22 09/05/23  [provider]    Physical Exam   Triage Vital Signs: ED Triage Vitals  Encounter Vitals Group     BP 06/17/23 0204 (!) 158/105     Systolic BP Percentile --      Diastolic BP Percentile --      Pulse Rate 06/17/23 0204 79     Resp 06/17/23 0204 18     Temp 06/17/23 0202 (P) 98.7 F (37.1 C)     Temp Source 06/17/23 0204 Oral     SpO2 --      Weight 06/17/23 0208 167 lb (75.8 kg)     Height 06/17/23 0208 5\' 2"  (1.575 m)     Head Circumference --      Peak Flow --      Pain Score 06/17/23 0207 8     Pain Loc --      Pain Education --      Exclude from Growth Chart --     Most recent vital signs: Vitals:   06/17/23 0204 06/17/23 0452  BP: (!) 158/105 (!) 160/94  Pulse: 79  81  Resp: 18 16  Temp: 98.7 F (37.1 C)     CONSTITUTIONAL: Alert, responds appropriately to questions. Well-appearing; well-nourished HEAD: Normocephalic, atraumatic EYES: Conjunctivae clear, pupils appear equal, sclera nonicteric ENT: normal nose; moist mucous membranes NECK: Supple, normal ROM, no midline spinal tenderness, step-off or deformity.  She is tender to palpation over the left trapezius muscle with no overlying skin changes.  This muscle is tight to palpation. CARD: RRR; S1 and S2 appreciated RESP: Normal chest excursion without splinting or tachypnea; breath sounds clear and equal bilaterally; no wheezes, no rhonchi, no rales, no hypoxia or respiratory distress, speaking full sentences ABD/GI: Non-distended; soft, non-tender, no rebound, no guarding, no peritoneal signs BACK: The back appears normal, no midline spinal tenderness or step-off or deformity EXT: Normal ROM in all joints; no deformity noted, no  edema SKIN: Normal color for age and race; warm; no rash on exposed skin NEURO: Moves all extremities equally, normal speech, strength 5/5 in all 4 extremities, 2+ deep tendon reflexes in bilateral upper and lower extremities, no clonus, no saddle anesthesia, normal speech PSYCH: The patient's mood and manner are appropriate.   ED Results / Procedures / Treatments   LABS: (all labs ordered are listed, but only abnormal results are displayed) Labs Reviewed - No data to display   EKG:  EKG Interpretation Date/Time:  Wednesday June 17 2023 02:12:47 EST Ventricular Rate:  80 PR Interval:  148 QRS Duration:  78 QT Interval:  368 QTC Calculation: 424 R Axis:   -13  Text Interpretation: Normal sinus rhythm Nonspecific ST and T wave abnormality Abnormal ECG When compared with ECG of 09-Apr-2023 11:04, Sinus rhythm has replaced Electronic atrial pacemaker ST no longer elevated in Lateral leads T wave inversion now evident in Lateral leads Confirmed by Rochele Raring [phone]) on 06/17/2023 5:20:53 AM         RADIOLOGY: My personal review and interpretation of imaging: CT cervical spine shows degenerative changes but no acute abnormality.  I have personally reviewed all radiology reports.   CT Cervical Spine Wo Contrast  Result Date: 06/17/2023 CLINICAL DATA:  72 year old female with neck pain onset 2 days ago. EXAM: CT CERVICAL SPINE WITHOUT CONTRAST TECHNIQUE: Multidetector CT imaging of the cervical spine was performed without intravenous contrast. Multiplanar CT image reconstructions were also generated. RADIATION DOSE REDUCTION: This exam was performed according to the departmental dose-optimization program which includes automated exposure control, adjustment of the mA and/or kV according to patient size and/or use of iterative reconstruction technique. COMPARISON:  Head CT 04/09/2023, CTA head and neck 04/10/2023. FINDINGS: Alignment: Straightening of cervical lordosis stable since  September. Mildly increased reversal of lordosis now. Cervicothoracic junction alignment is within normal limits. Bilateral posterior element alignment is within normal limits. Skull base and vertebrae: Background bone mineralization is normal for age. Visualized skull base is intact. No atlanto-occipital dissociation. C1 and C2 appear intact and aligned. Degenerative sclerosis of the cervical vertebrae C3 through C6. No acute osseous abnormality identified. Soft tissues and spinal canal: No prevertebral fluid or swelling. No visible canal hematoma. Pharynx and mandible motion artifact. Mild cervical carotid calcified atherosclerosis. Disc levels: Severe chronic disc and endplate degeneration C3-C4 through C5-C6 with vacuum disc and endplate sclerosis. This appears stable since September with up to mild associated cervical spinal stenosis. Bulky C6-C7 anterior endplate osteophyte with evidence of developing interbody ankylosis there (coronal image 16). Upper chest: Left chest pacemaker type device. Visible upper thoracic levels appear intact. Upper lungs are clear. Other: Bilateral  TMJ degeneration. Cervicomedullary junction is within normal limits. Visible paranasal sinuses and mastoids are aerated. IMPRESSION: 1.  No acute osseous abnormality in the cervical spine. 2. 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 Developing degenerative interbody ankylosis at C6-C7. Electronically Signed   By: Odessa Fleming M.D.   On: 06/17/2023 04:00     PROCEDURES:  Critical Care performed: No     Procedures    IMPRESSION / MDM / ASSESSMENT AND PLAN / ED COURSE  I reviewed the triage vital signs and the nursing notes.    Patient here with left trapezius muscle spasm, torticollis.    DIFFERENTIAL DIAGNOSIS (includes but not limited to):   Trapezius muscle spasm, torticollis, doubt cervical myelopathy, epidural abscess or hematoma, discitis  or osteomyelitis.  Pathologic fracture is on the differential given patient's age.   Patient's presentation is most consistent with acute presentation with potential threat to life or bodily function.   PLAN: CT of the cervical spine obtained from triage given patient's age and complaints of neck pain.  CT scan reviewed and interpreted by myself and the radiologist and shows degenerative changes and mild spinal stenosis but no acute abnormality.  She is neurologically intact here and able to ambulate with a walker which is her baseline.  She has no midline tenderness and seems to be mostly tender over the left trapezius muscle and exam is consistent with a muscle spasm.  Recommended stretching, massage, alternating heat and ice.  Will discharge with short course of pain medication and Lidoderm patches.  Recommended follow-up with PCP if symptoms not improving with conservative management.   MEDICATIONS GIVEN IN ED: Medications  lidocaine (LIDODERM) 5 % 1 patch (1 patch Transdermal Patch Applied 06/17/23 0451)  HYDROcodone-acetaminophen (NORCO/VICODIN) 5-325 MG per tablet 1 tablet (1 tablet Oral Given 06/17/23 0451)     ED COURSE:  At this time, I do not feel there is any life-threatening condition present. I reviewed all nursing notes, vitals, pertinent previous records.  All lab and urine results, EKGs, imaging ordered have been independently reviewed and interpreted by myself.  I reviewed all available radiology reports from any imaging ordered this visit.  Based on my assessment, I feel the patient is safe to be discharged home without further emergent workup and can continue workup as an outpatient as needed. Discussed all findings, treatment plan as well as usual and customary return precautions.  They verbalize understanding and are comfortable with this plan.  Outpatient follow-up has been provided as needed.  All questions have been answered.    CONSULTS: none  OUTSIDE RECORDS REVIEWED:   Reviewed last internal medicine visit on 05/18/2023.       FINAL CLINICAL IMPRESSION(S) / ED DIAGNOSES   Final diagnoses:  Torticollis, acute     Rx / DC Orders   ED Discharge Orders          Ordered    HYDROcodone-acetaminophen (NORCO/VICODIN) 5-325 MG tablet  Every 6 hours PRN        06/17/23 0446    lidocaine (LIDODERM) 5 %  Every 12 hours        06/17/23 0451             Note:  This document was prepared using Dragon voice recognition software and may include unintentional dictation errors.   Caera Enwright, Layla Maw, DO 06/17/23 [id number]

## 2023-08-31 ENCOUNTER — Emergency Department: Payer: 59

## 2023-08-31 ENCOUNTER — Inpatient Hospital Stay: Payer: 59

## 2023-08-31 ENCOUNTER — Encounter: Payer: Self-pay | Admitting: Emergency Medicine

## 2023-08-31 ENCOUNTER — Other Ambulatory Visit: Payer: Self-pay

## 2023-08-31 ENCOUNTER — Inpatient Hospital Stay
Admission: EM | Admit: 2023-08-31 | Discharge: 2023-09-03 | DRG: 689 | Disposition: A | Discharge status: Skilled Nursing Facility | Payer: 59 | Attending: Osteopathic Medicine | Admitting: Osteopathic Medicine

## 2023-08-31 DIAGNOSIS — F17210 Nicotine dependence, cigarettes, uncomplicated: Secondary | ICD-10-CM | POA: Diagnosis present

## 2023-08-31 DIAGNOSIS — I10 Essential (primary) hypertension: Secondary | ICD-10-CM | POA: Diagnosis present

## 2023-08-31 DIAGNOSIS — N390 Urinary tract infection, site not specified: Principal | ICD-10-CM

## 2023-08-31 DIAGNOSIS — B961 Klebsiella pneumoniae [K. pneumoniae] as the cause of diseases classified elsewhere: Secondary | ICD-10-CM | POA: Diagnosis present

## 2023-08-31 DIAGNOSIS — E669 Obesity, unspecified: Secondary | ICD-10-CM | POA: Diagnosis present

## 2023-08-31 DIAGNOSIS — R61 Generalized hyperhidrosis: Secondary | ICD-10-CM

## 2023-08-31 DIAGNOSIS — R0602 Shortness of breath: Secondary | ICD-10-CM

## 2023-08-31 DIAGNOSIS — Z811 Family history of alcohol abuse and dependence: Secondary | ICD-10-CM

## 2023-08-31 DIAGNOSIS — Z1152 Encounter for screening for COVID-19: Secondary | ICD-10-CM

## 2023-08-31 DIAGNOSIS — G894 Chronic pain syndrome: Secondary | ICD-10-CM | POA: Diagnosis present

## 2023-08-31 DIAGNOSIS — N10 Acute tubulo-interstitial nephritis: Secondary | ICD-10-CM | POA: Diagnosis not present

## 2023-08-31 DIAGNOSIS — I160 Hypertensive urgency: Secondary | ICD-10-CM | POA: Diagnosis present

## 2023-08-31 DIAGNOSIS — F319 Bipolar disorder, unspecified: Secondary | ICD-10-CM | POA: Diagnosis present

## 2023-08-31 DIAGNOSIS — M797 Fibromyalgia: Secondary | ICD-10-CM | POA: Diagnosis present

## 2023-08-31 DIAGNOSIS — F331 Major depressive disorder, recurrent, moderate: Secondary | ICD-10-CM | POA: Diagnosis present

## 2023-08-31 DIAGNOSIS — F17290 Nicotine dependence, other tobacco product, uncomplicated: Secondary | ICD-10-CM | POA: Diagnosis present

## 2023-08-31 DIAGNOSIS — R404 Transient alteration of awareness: Secondary | ICD-10-CM

## 2023-08-31 DIAGNOSIS — G9341 Metabolic encephalopathy: Secondary | ICD-10-CM | POA: Diagnosis present

## 2023-08-31 DIAGNOSIS — G629 Polyneuropathy, unspecified: Secondary | ICD-10-CM | POA: Diagnosis present

## 2023-08-31 DIAGNOSIS — Z950 Presence of cardiac pacemaker: Secondary | ICD-10-CM

## 2023-08-31 DIAGNOSIS — Z79899 Other long term (current) drug therapy: Secondary | ICD-10-CM

## 2023-08-31 DIAGNOSIS — Z7982 Long term (current) use of aspirin: Secondary | ICD-10-CM

## 2023-08-31 DIAGNOSIS — Z6831 Body mass index (BMI) 31.0-31.9, adult: Secondary | ICD-10-CM

## 2023-08-31 DIAGNOSIS — R112 Nausea with vomiting, unspecified: Secondary | ICD-10-CM

## 2023-08-31 DIAGNOSIS — R531 Weakness: Secondary | ICD-10-CM

## 2023-08-31 DIAGNOSIS — Z813 Family history of other psychoactive substance abuse and dependence: Secondary | ICD-10-CM

## 2023-08-31 DIAGNOSIS — J45909 Unspecified asthma, uncomplicated: Secondary | ICD-10-CM | POA: Diagnosis present

## 2023-08-31 DIAGNOSIS — R4182 Altered mental status, unspecified: Secondary | ICD-10-CM | POA: Insufficient documentation

## 2023-08-31 DIAGNOSIS — F419 Anxiety disorder, unspecified: Secondary | ICD-10-CM | POA: Diagnosis present

## 2023-08-31 DIAGNOSIS — F411 Generalized anxiety disorder: Secondary | ICD-10-CM | POA: Diagnosis present

## 2023-08-31 DIAGNOSIS — N3001 Acute cystitis with hematuria: Secondary | ICD-10-CM | POA: Diagnosis not present

## 2023-08-31 DIAGNOSIS — R32 Unspecified urinary incontinence: Secondary | ICD-10-CM

## 2023-08-31 DIAGNOSIS — E785 Hyperlipidemia, unspecified: Secondary | ICD-10-CM | POA: Diagnosis present

## 2023-08-31 DIAGNOSIS — N12 Tubulo-interstitial nephritis, not specified as acute or chronic: Secondary | ICD-10-CM | POA: Diagnosis present

## 2023-08-31 LAB — COMPREHENSIVE METABOLIC PANEL WITH GFR
ALT: 12 U/L (ref 0–44)
AST: 13 U/L — ABNORMAL LOW (ref 15–41)
Albumin: 4.2 g/dL (ref 3.5–5.0)
Alkaline Phosphatase: 71 U/L (ref 38–126)
Anion gap: 13 (ref 5–15)
BUN: 15 mg/dL (ref 8–23)
CO2: 22 mmol/L (ref 22–32)
Calcium: 9.6 mg/dL (ref 8.9–10.3)
Chloride: 106 mmol/L (ref 98–111)
Creatinine, Ser: 0.92 mg/dL (ref 0.44–1.00)
GFR, Estimated: >60 mL/min
Glucose, Bld: 102 mg/dL — ABNORMAL HIGH (ref 70–99)
Potassium: 3.9 mmol/L (ref 3.5–5.1)
Sodium: 141 mmol/L (ref 135–145)
Total Bilirubin: 0.6 mg/dL (ref 0.0–1.2)
Total Protein: 7.8 g/dL (ref 6.5–8.1)

## 2023-08-31 LAB — CBG MONITORING, ED: Glucose-Capillary: 96 mg/dL (ref 70–99)

## 2023-08-31 LAB — DIFFERENTIAL
Abs Immature Granulocytes: 0.05 10*3/uL (ref 0.00–0.07)
Basophils Absolute: 0 10*3/uL (ref 0.0–0.1)
Basophils Relative: 0 %
Eosinophils Absolute: 0.1 10*3/uL (ref 0.0–0.5)
Eosinophils Relative: 1 %
Immature Granulocytes: 0 %
Lymphocytes Relative: 17 %
Lymphs Abs: 1.9 10*3/uL (ref 0.7–4.0)
Monocytes Absolute: 0.9 10*3/uL (ref 0.1–1.0)
Monocytes Relative: 8 %
Neutro Abs: 8.7 10*3/uL — ABNORMAL HIGH (ref 1.7–7.7)
Neutrophils Relative %: 74 %

## 2023-08-31 LAB — CBC
HCT: 45.7 % (ref 36.0–46.0)
Hemoglobin: 14.7 g/dL (ref 12.0–15.0)
MCH: 27.5 pg (ref 26.0–34.0)
MCHC: 32.2 g/dL (ref 30.0–36.0)
MCV: 85.6 fL (ref 80.0–100.0)
Platelets: 311 10*3/uL (ref 150–400)
RBC: 5.34 MIL/uL — ABNORMAL HIGH (ref 3.87–5.11)
RDW: 15.5 % (ref 11.5–15.5)
WBC: 11.7 10*3/uL — ABNORMAL HIGH (ref 4.0–10.5)
nRBC: 0 % (ref 0.0–0.2)

## 2023-08-31 LAB — URINALYSIS, ROUTINE W REFLEX MICROSCOPIC
Bilirubin Urine: NEGATIVE
Glucose, UA: NEGATIVE mg/dL
Ketones, ur: NEGATIVE mg/dL
Nitrite: POSITIVE — AB
Protein, ur: 100 mg/dL — AB
Specific Gravity, Urine: 1.02 (ref 1.005–1.030)
WBC, UA: >50 WBC/hpf (ref 0–5)
pH: 5 (ref 5.0–8.0)

## 2023-08-31 LAB — PROTIME-INR
INR: 1.1 (ref 0.8–1.2)
Prothrombin Time: 14.1 s (ref 11.4–15.2)

## 2023-08-31 LAB — ETHANOL: Alcohol, Ethyl (B): <10 mg/dL (ref <10)

## 2023-08-31 LAB — RESP PANEL BY RT-PCR (RSV, FLU A&B, COVID)  RVPGX2
Influenza A by PCR: NEGATIVE
Influenza B by PCR: NEGATIVE
Resp Syncytial Virus by PCR: NEGATIVE
SARS Coronavirus 2 by RT PCR: NEGATIVE

## 2023-08-31 LAB — APTT: aPTT: 31 s (ref 24–36)

## 2023-08-31 MED ORDER — ATORVASTATIN CALCIUM 20 MG PO TABS
40.0000 mg | ORAL_TABLET | Freq: Every day | ORAL | Status: DC
  Administered 2023-09-01 – 2023-09-02 (×2): 40 mg via ORAL
  Filled 2023-08-31 (×2): qty 2

## 2023-08-31 MED ORDER — ACETAMINOPHEN 325 MG PO TABS: 650.0000 mg | ORAL_TABLET | Freq: Four times a day (QID) | ORAL | Status: DC | PRN

## 2023-08-31 MED ORDER — LOSARTAN POTASSIUM 50 MG PO TABS
50.0000 mg | ORAL_TABLET | Freq: Every day | ORAL | Status: DC
  Administered 2023-09-01 – 2023-09-03 (×3): 50 mg via ORAL
  Filled 2023-08-31 (×3): qty 1

## 2023-08-31 MED ORDER — LACTATED RINGERS IV SOLN: INTRAVENOUS | Status: DC

## 2023-08-31 MED ORDER — ACETAMINOPHEN 650 MG RE SUPP: 650.0000 mg | Freq: Four times a day (QID) | RECTAL | Status: DC | PRN

## 2023-08-31 MED ORDER — SODIUM CHLORIDE 0.9 % IV SOLN
2.0000 g | Freq: Once | INTRAVENOUS | Status: AC
  Administered 2023-08-31: 2 g via INTRAVENOUS
  Filled 2023-08-31: qty 20

## 2023-08-31 MED ORDER — ASPIRIN 81 MG PO TBEC
81.0000 mg | DELAYED_RELEASE_TABLET | Freq: Every day | ORAL | Status: DC
  Administered 2023-09-01 – 2023-09-03 (×3): 81 mg via ORAL
  Filled 2023-08-31 (×3): qty 1

## 2023-08-31 MED ORDER — SODIUM CHLORIDE 0.9 % IV SOLN
2.0000 g | INTRAVENOUS | Status: DC
  Administered 2023-09-01 – 2023-09-02 (×2): 2 g via INTRAVENOUS
  Filled 2023-08-31 (×4): qty 20

## 2023-08-31 MED ORDER — LORAZEPAM 0.5 MG PO TABS: 0.5000 mg | ORAL_TABLET | Freq: Four times a day (QID) | ORAL | Status: DC | PRN

## 2023-08-31 MED ORDER — SODIUM CHLORIDE 0.9% FLUSH: 3.0000 mL | Freq: Once | INTRAVENOUS | Status: DC

## 2023-08-31 MED ORDER — ENOXAPARIN SODIUM 40 MG/0.4ML IJ SOSY
40.0000 mg | PREFILLED_SYRINGE | INTRAMUSCULAR | Status: DC
  Administered 2023-08-31 – 2023-09-02 (×3): 40 mg via SUBCUTANEOUS
  Filled 2023-08-31 (×3): qty 0.4

## 2023-08-31 MED ORDER — AMLODIPINE BESYLATE 10 MG PO TABS
10.0000 mg | ORAL_TABLET | Freq: Every day | ORAL | Status: DC
  Administered 2023-09-01 – 2023-09-03 (×3): 10 mg via ORAL
  Filled 2023-08-31 (×2): qty 1
  Filled 2023-08-31: qty 2

## 2023-08-31 MED ORDER — LOSARTAN POTASSIUM 50 MG PO TABS
50.0000 mg | ORAL_TABLET | Freq: Once | ORAL | Status: AC
  Administered 2023-08-31: 50 mg via ORAL
  Filled 2023-08-31: qty 1

## 2023-08-31 MED ORDER — ALBUTEROL SULFATE (2.5 MG/3ML) 0.083% IN NEBU: 3.0000 mL | INHALATION_SOLUTION | Freq: Four times a day (QID) | RESPIRATORY_TRACT | Status: DC | PRN

## 2023-08-31 MED ORDER — ONDANSETRON HCL 4 MG PO TABS
4.0000 mg | ORAL_TABLET | Freq: Four times a day (QID) | ORAL | Status: DC | PRN
Start: 2023-08-31 — End: 2023-09-05

## 2023-08-31 MED ORDER — HYDRALAZINE HCL 20 MG/ML IJ SOLN: 5.0000 mg | Freq: Four times a day (QID) | INTRAMUSCULAR | Status: DC | PRN

## 2023-08-31 MED ORDER — AMLODIPINE BESYLATE 5 MG PO TABS: 10.0000 mg | ORAL_TABLET | Freq: Once | ORAL | Status: DC

## 2023-08-31 MED ORDER — ONDANSETRON HCL 4 MG/2ML IJ SOLN
4.0000 mg | Freq: Four times a day (QID) | INTRAMUSCULAR | Status: DC | PRN
  Filled 2023-08-31: qty 2

## 2023-08-31 NOTE — ED Notes (Signed)
 Pt to room via wheelchair. Pt assisted to toilet in the room.

## 2023-08-31 NOTE — Assessment & Plan Note (Deleted)
 Present on admission Continue treatment with ceftriaxone 2 g IV daily, to complete a 5-day course Urine culture, blood cultures x 2 are in process

## 2023-08-31 NOTE — Hospital Course (Signed)
   Hospital course / significant events:   HPI: [name] is a 73 year old female with history of hypertension, neuropathy, depression, anxiety, hyperlipidemia, who presents emergency department from home for chief concerns of altered mental status, urinary incontinence in her bed.  02/17: UA was positive for trace leukocytes and nitrates. ED treatment: Ceftriaxone 2 g IV one-time dose, LR infusion at 150 mL/h, losartan 50 mg p.o. one-time dose. Admitted to hospitalist service.  2/18.  Patient having pain in her right back, suspect pyelonephritis.  Also had some slurred speech and confusion coming in - initial CT scan of the head was negative.  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 02/19: CT head nonacute. CT renal nonacute UCx (+)Klebsiella, Susceptibilities pending. SNF rehab placement pending.      Consultants:  none  Procedures/Surgeries: none      ASSESSMENT & PLAN:   Acute pyelonephritis Follow-up urine culture --> (+)Klebsiella, Susceptibilities pending Continue IV Rocephin.   Acute metabolic encephalopathy - resolved  Suspect secondary to acute pyelonephritis  Continue aspirin and Lipitor.   Hypertensive urgency Blood pressure better controlled now continue Norvasc and Cozaar   Hyperlipidemia Continue Lipitor   GAD (generalized anxiety disorder) Ativan 0.5 mg p.o. every 6 hours as needed for anxiety, 2 doses ordered on admission   Chronic pain syndrome Fibromyalgia Restart gabapentin lower dose   Anxiety As needed Ativan, restart Prozac   Chest pain Ruled out ACS Monitor       Class 1 obesity based on BMI: Body mass index is 31.24 kg/m.  Underweight - under 18  overweight - 25 to 29 obese - 30 or more Class 1 obesity: BMI of 30.0 to 34 Class 2 obesity: BMI of 35.0 to 39 Class 3 obesity: BMI of 40.0 to 49 Super Morbid Obesity: BMI 50-59 Super-super Morbid Obesity: BMI  60+ Significantly low or high BMI is associated with higher medical risk.  Weight management advised as adjunct to other disease management and risk reduction treatments    DVT prophylaxis: lovenox  IV fluids: no continuous IV fluids  Nutrition: cardiac diet  Central lines / invasive devices: none  Code Status: FULL CODE ACP documentation reviewed: none on file in [facility]  TOC needs: placement Barriers to dispo / significant pending items: placement, urine culture results, anticipate medically ready by tomorrow 02/20

## 2023-08-31 NOTE — H&P (Addendum)
 History and Physical   Kari Hughes:[id number] DOB: 12/30/1950 DOA: 08/31/2023  PCP: Enid Baas, MD  Outpatient Specialists: Dr. Lalla Brothers, [facility] Cardiology Patient coming from: Home via EMS  I have personally briefly reviewed patient's old medical records in [facility] EMR.  Chief Concern: Altered mental status  HPI: [name] is a 73 year old female with history of hypertension, neuropathy, depression, anxiety, hyperlipidemia, who presents emergency department from home for chief concerns of altered mental status, urinary incontinence in her bed.  Vitals in the ED showed temperature of 98.1, respiration rate of 18, heart rate of 74, blood pressure 191/99, SpO2 of 99% on room air.  Serum sodium is 141, potassium 3.8, chloride 106, bicarb 22, BUN of 15, serum creatinine of 0.92, EGFR greater than 60, nonfasting blood glucose 102, WBC 11.7, hemoglobin 14.7, platelets of 311.  UA was positive for trace leukocytes and nitrates.  ED treatment: Ceftriaxone 2 g IV one-time dose, LR infusion at 150 mL/h, losartan 50 mg p.o. one-time dose. --------------------------------- At bedside, patient was able to tell me her first and last name, age, location, current calendar year.  She smells of cigarette smoking/stale cigarettes.  She reports to me that she is very hungry and she is about to eat her fried fish sandwich.  She reports to me that she was brought to the emergency department because she was not acting right and that she had urinated all over herself which she never does.  She reports that she did feel the urgency to urinate however the feeling came on so quickly that she did not have time to make it to the restroom.  She reports that this is not normal for her.  She reports she has been having dysuria for 1 day.  Per sister, patient has been drinking a lot of Dr. Reino Hughes.  Social history: She currently lives on her own and has a nursing aide that comes 2 hours every  day.  She endorses current tobacco use, smoking only 1 cigarettes/day.  She denies EtOH and recreational drug use.  She reports she is retired.  ROS: Constitutional: no weight change, no fever ENT/Mouth: no sore throat, no rhinorrhea Eyes: no eye pain, no vision changes Cardiovascular: no chest pain, no dyspnea,  no edema, no palpitations Respiratory: no cough, no sputum, no wheezing Gastrointestinal: no nausea, no vomiting, no diarrhea, no constipation Genitourinary: + urinary incontinence, + dysuria, no hematuria Musculoskeletal: no arthralgias, no myalgias Skin: no skin lesions, no pruritus, Neuro: + weakness, no loss of consciousness, no syncope Psych: no anxiety, no depression, no decrease appetite Heme/Lymph: no bruising, no bleeding  ED Course: Discussed with EDP, patient requiring hospitalization for chief concerns of altered mental status, hypertensive urgency.  Assessment/Plan  Principal Problem:   UTI (urinary tract infection) Active Problems:   Essential hypertension   Hypertensive urgency   Hyperlipidemia   Obesity (BMI 30-39.9)   Anxiety   MDD (major depressive disorder), recurrent episode, moderate (HCC)   Fibromyalgia   Chronic pain syndrome   GAD (generalized anxiety disorder)   Altered mental status   Assessment and Plan:  * UTI (urinary tract infection) Present on admission Continue treatment with ceftriaxone 2 g IV daily, to complete a 5-day course Urine culture, blood cultures x 2 are in process  Hypertensive urgency Presumed secondary to patient not taking home antihypertensive medications at home Controlled currently with losartan 50 mg p.o. one-time dose per EDP Ordered hydralazine 5 mg IV every 6 hours as needed for SBP greater  than 165, 5 days ordered  Essential hypertension Patient missed home blood pressure medicine due to being sent to the ED Home amlodipine 10 mg daily resume for 09/01/2023, losartan 50 mg daily was given in the ED and  resumed regular dosing on 09/01/2023  Obesity (BMI 30-39.9) This complicates overall care and prognosis.   Hyperlipidemia Home atorvastatin 40 mg nightly resumed  Altered mental status Appears to have resolved as patient was awake alert and oriented to self, age, location, current calendar year.  She reports that she was brought to the ED because she had urinated on herself.  She reports that she could not make it to the restroom due to urgency sensation. Presumed secondary to UTI, treat per above  GAD (generalized anxiety disorder) Ativan 0.5 mg p.o. every 6 hours as needed for anxiety, 2 doses ordered on admission  Chronic pain syndrome PDMP reviewed Patient currently has one active PDMP prescription for gabapentin 600 mg tablet, 30-day supply that was filled on 08/06/2023.  Fibromyalgia PDMP reviewed  Social concerns-patient requesting that her nursing aide can stay for more than 2 hours.  She is requesting that a doctor write this for permission slip for her.  PT, OT ordered on admission.  TOC has been consulted relaying this concern.  Chart reviewed.   DVT prophylaxis: Enoxaparin subcutaneous every 24 hours, weight-based dosing Code Status: Full code Diet: Heart healthy Family Communication: Updated sister, Charlean Sanfilippo over the phone Disposition Plan: Pending clinical course Consults called: PT, OT Admission status: Telemetry cardiac, inpatient  Past Medical History:  Diagnosis Date   Asthma    Depression    Hypertension    Hypertension 2002   Spinal stenosis 2012   Vertigo    Past Surgical History:  Procedure Laterality Date   JOINT REPLACEMENT Left 2013   PACEMAKER PLACEMENT  2019   Social History:  reports that she has been smoking cigarettes and e-cigarettes. She has never used smokeless tobacco. She reports current alcohol use of about 1.0 standard drink of alcohol per week. She reports current drug use. Drug: Marijuana.  No Known Allergies Family History   Problem Relation Age of Onset   Drug abuse Brother    Alcohol abuse Brother    Drug abuse Brother    Alcohol abuse Brother    Family history: Family history reviewed and not pertinent.  Prior to Admission medications   Medication Sig Start Date End Date Taking? Authorizing Provider  mirtazapine (REMERON) 7.5 MG tablet Take 7.5 mg by mouth at bedtime as needed. 08/06/23  Yes [provider]  acetaminophen (TYLENOL) 500 MG tablet Take 1,000 mg by mouth every 6 (six) hours as needed.    [provider]  albuterol (VENTOLIN HFA) 108 (90 Base) MCG/ACT inhaler Inhale 2 puffs into the lungs every 6 (six) hours as needed for shortness of breath or wheezing. 03/27/22 12/18/23  [provider]  amLODipine (NORVASC) 10 MG tablet Take 1 tablet by mouth daily. 03/31/23   [provider]  aspirin EC 81 MG tablet Take 1 tablet (81 mg total) by mouth daily. Swallow whole. 04/12/23   Alford Highland, MD  atorvastatin (LIPITOR) 40 MG tablet Take 1 tablet (40 mg total) by mouth daily. 04/12/23   Alford Highland, MD  baclofen (LIORESAL) 10 MG tablet Take 10 mg by mouth 2 (two) times daily as needed.    [provider]  Cholecalciferol (VITAMIN D3) 50 MCG (2000 UT) CAPS Take 1 capsule (2,000 Units total) by mouth daily. 04/11/23  Alford Highland, MD  FLUoxetine (PROZAC) 20 MG capsule Take 20 mg by mouth every morning.    [provider]  gabapentin (NEURONTIN) 600 MG tablet Take 600 mg by mouth 3 (three) times daily. 03/09/23   [provider]  HYDROcodone-acetaminophen (NORCO/VICODIN) 5-325 MG tablet Take 1 tablet by mouth every 6 (six) hours as needed. 06/17/23   Ward, Layla Maw, DO  lidocaine (LIDODERM) 5 % Place 1 patch onto the skin every 12 (twelve) hours. Remove & Discard patch within 12 hours or as directed by MD 06/17/23 06/16/24  Ward, Layla Maw, DO  loratadine (CLARITIN) 10 MG tablet Take 10 mg by mouth daily. 03/27/22   [provider]   losartan (COZAAR) 50 MG tablet Take 1 tablet by mouth daily. 09/05/22 09/05/23  [provider]  meclizine (ANTIVERT) 25 MG tablet Take 12.5-25 mg by mouth 3 (three) times daily as needed for dizziness.    [provider]   Physical Exam: Vitals:   08/31/23 1622 08/31/23 1700 08/31/23 1730 08/31/23 1800  BP: (!) 191/99 (!) 140/86 136/74 132/67  Pulse: 75 68 71 62  Resp: 18   16  Temp: 98.1 F (36.7 C)     TempSrc: Oral     SpO2: 99% 97% 97% 97%  Weight:      Height:       Constitutional: appears age-appropriate, NAD Eyes: PERRL, lids and conjunctivae normal ENMT: Mucous membranes are moist. Posterior pharynx clear of any exudate or lesions. Age-appropriate dentition. Hearing appropriate Neck: normal, supple, no masses, no thyromegaly Respiratory: clear to auscultation bilaterally, no wheezing, no crackles. Normal respiratory effort. No accessory muscle use.  Cardiovascular: Regular rate and rhythm, no murmurs / rubs / gallops. No extremity edema. 2+ pedal pulses. No carotid bruits.  Abdomen: no tenderness, no masses palpated, no hepatosplenomegaly. Bowel sounds positive.  Musculoskeletal: no clubbing / cyanosis. No joint deformity upper and lower extremities. Good ROM, no contractures, no atrophy. Normal muscle tone.  Skin: no rashes, lesions, ulcers. No induration Neurologic: Sensation intact. Strength 5/5 in all 4.  Psychiatric: Normal judgment and insight. Alert and oriented x 3. Normal mood.   EKG: independently reviewed, showing sinus rhythm with rate of 64, QTc 400  Chest x-ray on Admission: I personally reviewed and I agree with radiologist reading as below.  DG Chest Port 1 View Result Date: 08/31/2023 CLINICAL DATA:  Altered mental status. EXAM: PORTABLE CHEST 1 VIEW COMPARISON:  04/09/2023 FINDINGS: Left-sided pacemaker remains in place. Upper normal heart size with stable mediastinal contours. Chronic elevation of right hemidiaphragm. Mild left lung base  atelectasis. No confluent consolidation. No pulmonary edema or pleural effusion. No pneumothorax. IMPRESSION: Mild left lung base atelectasis. Chronic elevation of right hemidiaphragm. Electronically Signed   By: Narda Rutherford M.D.   On: 08/31/2023 19:24   CT HEAD WO CONTRAST Result Date: 08/31/2023 CLINICAL DATA:  Mental status change, unknown cause. Headache, neuro deficit. Dysarthria, altered mental status, and dizziness. History of stroke. EXAM: CT HEAD WITHOUT CONTRAST TECHNIQUE: Contiguous axial images were obtained from the base of the skull through the vertex without intravenous contrast. RADIATION DOSE REDUCTION: This exam was performed according to the departmental dose-optimization program which includes automated exposure control, adjustment of the mA and/or kV according to patient size and/or use of iterative reconstruction technique. COMPARISON:  CTA head and neck 04/10/2023 FINDINGS: Brain: There is no evidence of an acute infarct, intracranial hemorrhage, mass, midline shift, or extra-axial fluid collection. Patchy hypodensities in the cerebral white matter  bilaterally are stable to mildly progressed and nonspecific but compatible with moderate to severe chronic small vessel ischemic disease. There are chronic lacunar infarcts in the basal ganglia regions bilaterally which have mildly increased in number. Moderate, central predominant cerebral atrophy is similar to the prior study. Vascular: Calcified atherosclerosis at the skull base. No hyperdense vessel. Skull: No acute fracture or suspicious osseous lesion. Sinuses/Orbits: Visualized paranasal sinuses and mastoid air cells are clear. Unremarkable orbits. Other: None. IMPRESSION: 1. No evidence of acute intracranial abnormality. 2. Moderate to severe chronic small vessel ischemic disease and moderate cerebral atrophy. Electronically Signed   By: Sebastian Ache M.D.   On: 08/31/2023 14:51   Labs on Admission: I have personally reviewed  following labs  CBC: Recent Labs  Lab 08/31/23 1137  WBC 11.7*  NEUTROABS 8.7*  HGB 14.7  HCT 45.7  MCV 85.6  PLT 311   Basic Metabolic Panel: Recent Labs  Lab 08/31/23 1137  NA 141  K 3.9  CL 106  CO2 22  GLUCOSE 102*  BUN 15  CREATININE 0.92  CALCIUM 9.6   GFR: Estimated Creatinine Clearance: 51.2 mL/min (by C-G formula based on SCr of 0.92 mg/dL).  Liver Function Tests: Recent Labs  Lab 08/31/23 1137  AST 13*  ALT 12  ALKPHOS 71  BILITOT 0.6  PROT 7.8  ALBUMIN 4.2   CBG: Recent Labs  Lab 08/31/23 1136  GLUCAP 96   Urine analysis:    Component Value Date/Time   COLORURINE YELLOW (A) 08/31/2023 1622   APPEARANCEUR HAZY (A) 08/31/2023 1622   LABSPEC 1.020 08/31/2023 1622   PHURINE 5.0 08/31/2023 1622   GLUCOSEU NEGATIVE 08/31/2023 1622   HGBUR MODERATE (A) 08/31/2023 1622   BILIRUBINUR NEGATIVE 08/31/2023 1622   KETONESUR NEGATIVE 08/31/2023 1622   PROTEINUR 100 (A) 08/31/2023 1622   NITRITE POSITIVE (A) 08/31/2023 1622   LEUKOCYTESUR TRACE (A) 08/31/2023 1622   This document was prepared using Dragon Voice Recognition software and may include unintentional dictation errors.  Dr. Sedalia Muta [facility]  If 7PM-7AM, please contact overnight-coverage provider If 7AM-7PM, please contact day attending provider [email or web address]  08/31/2023, 7:41 PM

## 2023-08-31 NOTE — ED Provider Notes (Signed)
 [facility] Provider Note   Event Date/Time   First MD Initiated Contact with Patient 08/31/23 1551     (approximate) History  Altered Mental Status  HPI Kari Hughes is a 73 y.o. female with a past medical history of hypertension, asthma, obesity, and bipolar disorder who presents complaining of altered mental status and generalized weakness.  Patient states that she had an episode of difficulty word finding as well as has been unable to perform her ADLs today including being unable to get out of bed this morning.  Patient had an episode of urinating on herself but denies any specific dysuria.  Patient also endorses chills but no fever.  Patient Dors is symptoms similar to this in the past when she has had a urinary tract infection. ROS: Patient currently denies any vision changes, tinnitus, difficulty speaking, facial droop, sore throat, chest pain, shortness of breath, abdominal pain, nausea/vomiting/diarrhea, dysuria, or numbness/paresthesias in any extremity   Physical Exam  Triage Vital Signs: ED Triage Vitals  Encounter Vitals Group     BP 08/31/23 1125 (!) 150/130     Systolic BP Percentile --      Diastolic BP Percentile --      Pulse Rate 08/31/23 1125 62     Resp 08/31/23 1125 17     Temp 08/31/23 1125 98.7 F (37.1 C)     Temp Source 08/31/23 1125 Oral     SpO2 08/31/23 1125 97 %     Weight 08/31/23 1143 165 lb 5.5 oz (75 kg)     Height 08/31/23 1143 5\' 1"  (1.549 m)     Head Circumference --      Peak Flow --      Pain Score --      Pain Loc --      Pain Education --      Exclude from Growth Chart --    Most recent vital signs: Vitals:   08/31/23 1155 08/31/23 1622  BP: (!) 153/99 (!) 191/99  Pulse:  75  Resp:  18  Temp:  98.1 F (36.7 C)  SpO2:  99%   General: Awake, oriented x4. CV:  Good peripheral perfusion.  Resp:  Normal effort.  Abd:  No distention.  Other:  Elderly obese African-American female resting comfortably in no  acute distress ED Results / Procedures / Treatments  Labs (all labs ordered are listed, but only abnormal results are displayed) Labs Reviewed  CBC - Abnormal; Notable for the following components:      Result Value   WBC 11.7 (*)    RBC 5.34 (*)    All other components within normal limits  DIFFERENTIAL - Abnormal; Notable for the following components:   Neutro Abs 8.7 (*)    All other components within normal limits  COMPREHENSIVE METABOLIC PANEL - Abnormal; Notable for the following components:   Glucose, Bld 102 (*)    AST 13 (*)    All other components within normal limits  URINALYSIS, ROUTINE W REFLEX MICROSCOPIC - Abnormal; Notable for the following components:   Color, Urine YELLOW (*)    APPearance HAZY (*)    Hgb urine dipstick MODERATE (*)    Protein, ur 100 (*)    Nitrite POSITIVE (*)    Leukocytes,Ua TRACE (*)    Bacteria, UA MANY (*)    All other components within normal limits  RESP PANEL BY RT-PCR (RSV, FLU A&B, COVID)  RVPGX2  CULTURE, BLOOD (ROUTINE X 2)  CULTURE, BLOOD (  ROUTINE X 2)  URINE CULTURE  ETHANOL  PROTIME-INR  APTT  CBG MONITORING, ED  CBG MONITORING, ED   EKG ED ECG REPORT I, Merwyn Katos, the attending physician, personally viewed and interpreted this ECG. Date: 08/31/2023 EKG Time: 1132 Rate: 64 Rhythm: normal sinus rhythm QRS Axis: normal Intervals: normal ST/T Wave abnormalities: normal Narrative Interpretation: no evidence of acute ischemia RADIOLOGY ED MD interpretation: CT of the head without contrast interpreted by me shows no evidence of acute abnormalities including no intracerebral hemorrhage, obvious masses, or significant edema -Agree with radiology assessment Official radiology report(s): CT HEAD WO CONTRAST Result Date: 08/31/2023 CLINICAL DATA:  Mental status change, unknown cause. Headache, neuro deficit. Dysarthria, altered mental status, and dizziness. History of stroke. EXAM: CT HEAD WITHOUT CONTRAST TECHNIQUE:  Contiguous axial images were obtained from the base of the skull through the vertex without intravenous contrast. RADIATION DOSE REDUCTION: This exam was performed according to the departmental dose-optimization program which includes automated exposure control, adjustment of the mA and/or kV according to patient size and/or use of iterative reconstruction technique. COMPARISON:  CTA head and neck 04/10/2023 FINDINGS: Brain: There is no evidence of an acute infarct, intracranial hemorrhage, mass, midline shift, or extra-axial fluid collection. Patchy hypodensities in the cerebral white matter bilaterally are stable to mildly progressed and nonspecific but compatible with moderate to severe chronic small vessel ischemic disease. There are chronic lacunar infarcts in the basal ganglia regions bilaterally which have mildly increased in number. Moderate, central predominant cerebral atrophy is similar to the prior study. Vascular: Calcified atherosclerosis at the skull base. No hyperdense vessel. Skull: No acute fracture or suspicious osseous lesion. Sinuses/Orbits: Visualized paranasal sinuses and mastoid air cells are clear. Unremarkable orbits. Other: None. IMPRESSION: 1. No evidence of acute intracranial abnormality. 2. Moderate to severe chronic small vessel ischemic disease and moderate cerebral atrophy. Electronically Signed   By: Sebastian Ache M.D.   On: 08/31/2023 14:51   PROCEDURES: Critical Care performed: No Procedures MEDICATIONS ORDERED IN ED: Medications  sodium chloride flush (NS) 0.9 % injection 3 mL (0 mLs Intravenous Hold 08/31/23 1556)  amLODipine (NORVASC) tablet 10 mg (has no administration in time range)  losartan (COZAAR) tablet 50 mg (has no administration in time range)  lactated ringers infusion (has no administration in time range)  cefTRIAXone (ROCEPHIN) 2 g in sodium chloride 0.9 % 100 mL IVPB (has no administration in time range)   IMPRESSION / MDM / ASSESSMENT AND PLAN / ED  COURSE  I reviewed the triage vital signs and the nursing notes.                             The patient is on the cardiac monitor to evaluate for evidence of arrhythmia and/or significant heart rate changes. Patient's presentation is most consistent with acute presentation with potential threat to life or bodily function. The Pt presents with altered mental status and sudden onset urinary incontinence highly concerning for sepsis (suspected urinary source). At this time, the Pt is satting well on room air, hypertensive, and appears HDS.  Will start empiric antibiotics.  Have low suspicion for a GI, skin/soft tissue, or CNS source at this time, but will reconsider if initial workup is unremarkable.  - CBC, BMP, LFTs - VBG - UA - BCx x2, Lactate - EKG - Head CT negative - Empiric Abx: Rocephin - Fluids: 150 mL/h LR  Given patient's altered mental status, significant weakness, unable  to perform ADLs without assistance at home, patient will require admission to the internal medicine service for further evaluation and management.  Dispo: Admit to medicine   FINAL CLINICAL IMPRESSION(S) / ED DIAGNOSES   Final diagnoses:  None   Rx / DC Orders   ED Discharge Orders     None      Note:  This document was prepared using Dragon voice recognition software and may include unintentional dictation errors.   Merwyn Katos, MD 08/31/23 [phone]

## 2023-08-31 NOTE — ED Notes (Signed)
Report to Yasmin, RN.  

## 2023-08-31 NOTE — ED Notes (Signed)
 ED Provider at bedside.

## 2023-08-31 NOTE — Assessment & Plan Note (Signed)
 PDMP reviewed.

## 2023-08-31 NOTE — Assessment & Plan Note (Addendum)
 Restart gabapentin lower dose

## 2023-08-31 NOTE — Assessment & Plan Note (Signed)
Home atorvastatin 40 mg nightly resumed

## 2023-08-31 NOTE — Assessment & Plan Note (Signed)
 Ativan 0.5 mg p.o. every 6 hours as needed for anxiety, 2 doses ordered on admission

## 2023-08-31 NOTE — Consult Note (Signed)
 CODE SEPSIS - PHARMACY COMMUNICATION  **Broad Spectrum Antibiotics should be administered within 1 hour of Sepsis diagnosis**  Time Code Sepsis Called/Page Received: 1700  Antibiotics Ordered: ceftriaxone  Time of 1st antibiotic administration: 1730  Additional action taken by pharmacy: None   Ronnald Ramp ,PharmD Clinical Pharmacist  08/31/2023  5:40 PM

## 2023-08-31 NOTE — ED Provider Triage Note (Signed)
 Emergency Medicine Provider Triage Evaluation Note  Kari Hughes , a 73 y.o. female  was evaluated in triage.  Pt complains of intermittent altered mental status since Saturday. Sister reports on Saturday, she thought she went out to eat with a friend when she hadn't. Sister reports she has noticed some changes to her speech since then. Patient complaining of headache and dizziness today. She was unable to get out of bed this morning and had been incontinent of urine, which is unusual.  Physical Exam  BP (!) 150/130 (BP Location: Left Arm)   Pulse 62   Temp 98.7 F (37.1 C) (Oral)   Resp 17   SpO2 97%  Gen:   Awake, no distress   Resp:  Normal effort  MSK:   Moves extremities without difficulty  Other:    Medical Decision Making  Medically screening exam initiated at 11:42 AM.  Appropriate orders placed.  Nastasha Hughes was informed that the remainder of the evaluation will be completed by another provider, this initial triage assessment does not replace that evaluation, and the importance of remaining in the ED until their evaluation is complete.  TIA/CVA work up started. Out of window for Code Stroke   Kari Hughes, [place] 08/31/23 4540

## 2023-08-31 NOTE — Assessment & Plan Note (Signed)
 -  This complicates overall care and prognosis.

## 2023-08-31 NOTE — Assessment & Plan Note (Signed)
 Patient missed home blood pressure medicine due to being sent to the ED Home amlodipine 10 mg daily resume for 09/01/2023, losartan 50 mg daily was given in the ED and resumed regular dosing on 09/01/2023

## 2023-08-31 NOTE — ED Triage Notes (Signed)
 Pt in via [facility] from home; per EMS, call out was for possible stroke, upon arrival of nurse aid, patient was unable to get herself out of bed this morning and had also urinated on herself which is abnormal for patient.  EMS states that upon their arrival, patient was then able to stand and ambulate w/ walker.    Patient is currently A/Ox4, does complain of ongoing dizziness and headache; hypertensive in triage.    Per sister, patient had an episode of altered mental status on Saturday, thinking she had been out to dinner w/ a friend when in reality, she had not.  Sister also repots changes to speech since then, states that is not normal for her.  Some mild dysarthria noted in triage, otherwise neurologically intact at this time.

## 2023-08-31 NOTE — Assessment & Plan Note (Signed)
 Presumed secondary to patient not taking home antihypertensive medications at home Controlled currently with losartan 50 mg p.o. one-time dose per EDP Ordered hydralazine 5 mg IV every 6 hours as needed for SBP greater than 165, 5 days ordered

## 2023-08-31 NOTE — ED Notes (Signed)
 Cox, MD Provider at bedside.

## 2023-08-31 NOTE — Assessment & Plan Note (Signed)
 Appears to have resolved as patient was awake alert and oriented to self, age, location, current calendar year.  She reports that she was brought to the ED because she had urinated on herself.  She reports that she could not make it to the restroom due to urgency sensation. Presumed secondary to UTI, treat per above

## 2023-09-01 ENCOUNTER — Inpatient Hospital Stay: Payer: 59

## 2023-09-01 DIAGNOSIS — R0602 Shortness of breath: Secondary | ICD-10-CM

## 2023-09-01 DIAGNOSIS — N10 Acute tubulo-interstitial nephritis: Secondary | ICD-10-CM

## 2023-09-01 DIAGNOSIS — G9341 Metabolic encephalopathy: Secondary | ICD-10-CM | POA: Diagnosis not present

## 2023-09-01 DIAGNOSIS — I160 Hypertensive urgency: Secondary | ICD-10-CM

## 2023-09-01 DIAGNOSIS — E669 Obesity, unspecified: Secondary | ICD-10-CM | POA: Diagnosis not present

## 2023-09-01 DIAGNOSIS — E785 Hyperlipidemia, unspecified: Secondary | ICD-10-CM

## 2023-09-01 DIAGNOSIS — R112 Nausea with vomiting, unspecified: Secondary | ICD-10-CM

## 2023-09-01 DIAGNOSIS — F419 Anxiety disorder, unspecified: Secondary | ICD-10-CM

## 2023-09-01 DIAGNOSIS — M797 Fibromyalgia: Secondary | ICD-10-CM

## 2023-09-01 DIAGNOSIS — R61 Generalized hyperhidrosis: Secondary | ICD-10-CM

## 2023-09-01 DIAGNOSIS — G894 Chronic pain syndrome: Secondary | ICD-10-CM

## 2023-09-01 DIAGNOSIS — F411 Generalized anxiety disorder: Secondary | ICD-10-CM

## 2023-09-01 LAB — CBC
HCT: 37.6 % (ref 36.0–46.0)
Hemoglobin: 12.1 g/dL (ref 12.0–15.0)
MCH: 27.4 pg (ref 26.0–34.0)
MCHC: 32.2 g/dL (ref 30.0–36.0)
MCV: 85.1 fL (ref 80.0–100.0)
Platelets: 251 10*3/uL (ref 150–400)
RBC: 4.42 MIL/uL (ref 3.87–5.11)
RDW: 15.4 % (ref 11.5–15.5)
WBC: 8.8 10*3/uL (ref 4.0–10.5)
nRBC: 0 % (ref 0.0–0.2)

## 2023-09-01 LAB — BASIC METABOLIC PANEL
Anion gap: 9 (ref 5–15)
BUN: 12 mg/dL (ref 8–23)
CO2: 22 mmol/L (ref 22–32)
Calcium: 8.7 mg/dL — ABNORMAL LOW (ref 8.9–10.3)
Chloride: 109 mmol/L (ref 98–111)
Creatinine, Ser: 0.96 mg/dL (ref 0.44–1.00)
GFR, Estimated: >60 mL/min (ref >60)
Glucose, Bld: 98 mg/dL (ref 70–99)
Potassium: 4.1 mmol/L (ref 3.5–5.1)
Sodium: 140 mmol/L (ref 135–145)

## 2023-09-01 LAB — TROPONIN I (HIGH SENSITIVITY)
Troponin I (High Sensitivity): 5 ng/L (ref <18)
Troponin I (High Sensitivity): 4 ng/L (ref <18)

## 2023-09-01 MED ORDER — GABAPENTIN 300 MG PO CAPS
300.0000 mg | ORAL_CAPSULE | Freq: Three times a day (TID) | ORAL | Status: DC
  Administered 2023-09-01 – 2023-09-03 (×6): 300 mg via ORAL
  Filled 2023-09-01 (×6): qty 1

## 2023-09-01 MED ORDER — HYDROCODONE-ACETAMINOPHEN 5-325 MG PO TABS
1.0000 | ORAL_TABLET | Freq: Four times a day (QID) | ORAL | Status: DC | PRN
  Administered 2023-09-02 – 2023-09-03 (×3): 1 via ORAL
  Filled 2023-09-01 (×3): qty 1

## 2023-09-01 MED ORDER — HYDROCODONE-ACETAMINOPHEN 5-325 MG PO TABS: 1.0000 | ORAL_TABLET | Freq: Four times a day (QID) | ORAL | Status: DC | PRN

## 2023-09-01 MED ORDER — SODIUM CHLORIDE 0.9 % IV SOLN
12.5000 mg | Freq: Once | INTRAVENOUS | Status: AC
  Administered 2023-09-01: 12.5 mg via INTRAVENOUS
  Filled 2023-09-01: qty 0.5

## 2023-09-01 MED ORDER — NITROGLYCERIN 0.4 MG SL SUBL: 0.4000 mg | SUBLINGUAL_TABLET | SUBLINGUAL | Status: DC | PRN

## 2023-09-01 MED ORDER — MORPHINE SULFATE (PF) 2 MG/ML IV SOLN: 1.0000 mg | INTRAVENOUS | Status: DC | PRN

## 2023-09-01 MED ORDER — FLUOXETINE HCL 20 MG PO CAPS
20.0000 mg | ORAL_CAPSULE | Freq: Every day | ORAL | Status: DC
  Administered 2023-09-01 – 2023-09-03 (×3): 20 mg via ORAL
  Filled 2023-09-01 (×3): qty 1

## 2023-09-01 NOTE — Assessment & Plan Note (Addendum)
 As needed Ativan, restart Prozac

## 2023-09-01 NOTE — Progress Notes (Signed)
 Patient having a lot of pain right flank.  Will get CT renal stone protocol and order as needed pain medication.  Dr. Alford Highland

## 2023-09-01 NOTE — ED Notes (Addendum)
 Pt reports she needed to urinate, attempted to get out of bed to bedside commode. Pt states her R side of butt hurt and had difficulty getting to sitting position, Pt stated she would rather use bed pan. Bed pain used at this time. PT orders are in place to assess Pts needs later. Pt was rotated to L side of her butt for her in bed

## 2023-09-01 NOTE — Evaluation (Signed)
 Occupational Therapy Evaluation Patient Details Name: Kari Hughes MRN: [id number] DOB: 1950-11-30 Today's Date: 09/01/2023   History of Present Illness   Kari Hughes is a 73 year old female with history of hypertension, neuropathy, depression, anxiety, hyperlipidemia, who presents emergency department from home for chief concerns of altered mental status, urinary incontinence in her bed.     Clinical Impressions Pt was seen for OT evaluation this date. Prior to hospital admission, pt was MOD I for ADL management although she reports requiring increased assistance for ADLs recently. Per chart, pt lives in an assisted living facility with an aid who comes to support IADL management daily. Pt presents to acute OT demonstrating impaired ADL performance and functional mobility 2/2 generalized weakness, decreased balance, and increased pain with activity. (See OT problem list for additional functional deficits). Pt currently requires MAX A for bed mobility and brief bout of sitting EOB, she is unable to tolerate further functional activity 2/2 significant R flank pain. RN notified and in at end of session to administer medications.  Pt would benefit from skilled OT services to address noted impairments and functional limitations (see below for any additional details) in order to maximize safety and independence while minimizing falls risk and caregiver burden. Anticipate the need for follow up OT services upon acute hospital DC.       If plan is discharge home, recommend the following:   Two people to help with walking and/or transfers;A lot of help with bathing/dressing/bathroom;Assistance with cooking/housework;Assist for transportation     Functional Status Assessment   Patient has had a recent decline in their functional status and demonstrates the ability to make significant improvements in function in a reasonable and predictable amount of time.     Equipment  Recommendations   BSC/3in1     Recommendations for Other Services         Precautions/Restrictions   Precautions Precautions: Fall Recall of Precautions/Restrictions: Intact Restrictions Weight Bearing Restrictions Per Provider Order: No     Mobility Bed Mobility Overal bed mobility: Needs Assistance Bed Mobility: Sidelying to Sit, Rolling, Sit to Sidelying Rolling: Min assist Sidelying to sit: Max assist     Sit to sidelying: Total assist, +2 for physical assistance General bed mobility comments: Multimodal cueing for logroll technique    Transfers                   General transfer comment: Pt unable to attempt 2/2 significant pain      Balance Overall balance assessment: Needs assistance Sitting-balance support: Bilateral upper extremity supported Sitting balance-Leahy Scale: Fair Sitting balance - Comments: Poor sitting tolerance, able to sit briefly without support but cannot sustain.                                   ADL either performed or assessed with clinical judgement   ADL Overall ADL's : Needs assistance/impaired                                       General ADL Comments: Pt significantly functioanlly limited by increased R flank/back pain and generalized weakness. She requires MAX A to come to sitting at EOB and is unable to tolerate upright sitting position for more than a few seconds. Requests to return to supine position almost immediately 2/2 10/10 pain. Requires +2 assist to  return to supine and boost up in bed. Able to perform self-feeding/UB ADL management at bed level with set up assist. Anticipate MAX A with +2 assist for STS with LB ADL Management.     Vision Patient Visual Report: No change from baseline       Perception         Praxis         Pertinent Vitals/Pain Pain Assessment Pain Assessment: 0-10 Pain Score: 10-Worst pain ever Pain Location: R flank/low bakc pain Pain  Descriptors / Indicators: Grimacing, Constant, Sore, Shooting, Sharp Pain Intervention(s): Limited activity within patient's tolerance, Monitored during session, Repositioned, Patient requesting pain meds-RN notified     Extremity/Trunk Assessment Upper Extremity Assessment Upper Extremity Assessment: Generalized weakness   Lower Extremity Assessment Lower Extremity Assessment: Generalized weakness       Communication Communication Communication: No apparent difficulties   Cognition Arousal: Alert Behavior During Therapy: WFL for tasks assessed/performed Cognition: No family/caregiver present to determine baseline             OT - Cognition Comments: Provides some conflicting information Re: home set up, but overall able to follow VCs fairly well. No family/caregiver present at bedside to confirm baseline cognition.                 Following commands: Intact       Cueing  General Comments   Cueing Techniques: Verbal cues;Gestural cues      Exercises Other Exercises Other Exercises: Pt educated on role of OT in acute setting, safety, bed mobility techniques, and DC recs.   Shoulder Instructions      Home Living Family/patient expects to be discharged to:: Assisted living                             Home Equipment: Shower seat;Rollator (4 wheels);Grab bars - toilet;Grab bars - tub/shower   Additional Comments: (P) ramp      Prior Functioning/Environment Prior Level of Function : History of Falls (last six months);Needs assist       Physical Assist : ADLs (physical);Mobility (physical) Mobility (physical): Gait ADLs (physical): Bathing;Dressing;IADLs Mobility Comments: Pt reports she uses a walker for mobility, generally able to get up and around her apartment herself, but does sometimes require assistance from staff on occsion. Pt denies falls history, however, per chart she had multiple falls ~4 months PTA. ADLs Comments: Pt has an aid  that comes 2 hours each day to assist with IADLs, pt reports she has recently needed increased assistance for ADLs.    OT Problem List: Decreased strength;Decreased activity tolerance;Pain;Impaired balance (sitting and/or standing);Decreased knowledge of use of DME or AE;Decreased safety awareness;Decreased coordination   OT Treatment/Interventions: Self-care/ADL training;Therapeutic exercise;DME and/or AE instruction;Balance training;Therapeutic activities      OT Goals(Current goals can be found in the care plan section)   Acute Rehab OT Goals Patient Stated Goal: To feel better OT Goal Formulation: With patient Time For Goal Achievement: 09/15/23 Potential to Achieve Goals: Good ADL Goals Pt Will Perform Grooming: sitting;with set-up;with supervision Pt Will Perform Lower Body Dressing: sit to/from stand;with set-up;with supervision Pt Will Transfer to Toilet: with set-up;with supervision;stand pivot transfer;bedside commode Pt Will Perform Toileting - Clothing Manipulation and hygiene: sit to/from stand;with supervision;with set-up;with adaptive equipment   OT Frequency:  Min 1X/week    Co-evaluation              AM-PAC OT "6 Clicks" Daily Activity  Outcome Measure Help from another person eating meals?: A Little Help from another person taking care of personal grooming?: A Little Help from another person toileting, which includes using toliet, bedpan, or urinal?: A Lot Help from another person bathing (including washing, rinsing, drying)?: A Lot Help from another person to put on and taking off regular upper body clothing?: A Lot Help from another person to put on and taking off regular lower body clothing?: A Lot 6 Click Score: 14   End of Session    Activity Tolerance: Patient limited by pain Patient left: in bed;with call bell/phone within reach;with bed alarm set (with RN in room administering  meds)  OT Visit Diagnosis: Other abnormalities of gait and  mobility (R26.89);Muscle weakness (generalized) (M62.81);Pain Pain - Right/Left: Right Pain - part of body:  (back/flank)                Time: 1610-9604 OT Time Calculation (min): 21 min Charges:  OT General Charges $OT Visit: 1 Visit OT Evaluation $OT Eval Moderate Complexity: 1 Mod OT Treatments $Self Care/Home Management : 8-22 mins  Rockney Ghee, M.S., OTR/L 09/01/23, 3:15 PM

## 2023-09-01 NOTE — ED Notes (Signed)
 Pts brief was wet this AM, changed brief and chucks. VSS obtained

## 2023-09-01 NOTE — Progress Notes (Signed)
 Progress Note   Patient: Kari Hughes GNF:[id number] DOB: [birth year] DOA: 08/31/2023     1 DOS: the patient was seen and examined on 09/01/2023   Brief hospital course: Kari Hughes is a 73 year old female with history of hypertension, neuropathy, depression, anxiety, hyperlipidemia, who presents emergency department from home for chief concerns of altered mental status, urinary incontinence in her bed.  Vitals in the ED showed temperature of 98.1, respiration rate of 18, heart rate of 74, blood pressure 191/99, SpO2 of 99% on room air.  Serum sodium is 141, potassium 3.8, chloride 106, bicarb 22, BUN of 15, serum creatinine of 0.92, EGFR greater than 60, nonfasting blood glucose 102, WBC 11.7, hemoglobin 14.7, platelets of 311.  UA was positive for trace leukocytes and nitrates.  ED treatment: Ceftriaxone 2 g IV one-time dose, LR infusion at 150 mL/h, losartan 50 mg p.o. one-time dose.  2/18.  Patient having pain in her right back and I suspect she has pyelonephritis.  Also had some slurred speech and confusion coming in.  Initial CT scan of the head was negative.  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  Assessment and Plan: * Acute pyelonephritis Follow-up urine culture.  Patient has pain in her right back.  Continue IV Rocephin.  Acute metabolic encephalopathy Suspect secondary to acute pyelonephritis but will repeat a CT scan of the head tomorrow to make sure she does not have a stroke.  Unable to get an MRI secondary to having a pacemaker.  Continue aspirin and Lipitor.  Patient answering questions and following commands.  Hypertensive urgency Blood pressure better controlled on Norvasc and Cozaar  Obesity (BMI 30-39.9) BMI 31.24  Hyperlipidemia Continue Lipitor  GAD (generalized anxiety disorder) Ativan 0.5 mg p.o. every 6 hours as needed for anxiety, 2 doses ordered on admission  Chronic pain syndrome Restart  gabapentin lower dose  Fibromyalgia Restart low-dose gabapentin  Anxiety As needed Ativan, restart Prozac        Subjective: Patient brought in with altered mental status.  She lives by herself.  Can walk with a walker but also has a wheelchair.  Patient complains of some pain in her right back.  Physical Exam: Vitals:   09/01/23 0725 09/01/23 0735 09/01/23 0900 09/01/23 1220  BP: 114/71  137/74 (!) 125/94  Pulse: 64  65 61  Resp: (!) 22  19 18   Temp:  97.6 F (36.4 C)    TempSrc:  Oral    SpO2: 97%  97% 94%  Weight:      Height:       Physical Exam HENT:     Head: Normocephalic.  Eyes:     General: Lids are normal.     Conjunctiva/sclera: Conjunctivae normal.  Cardiovascular:     Rate and Rhythm: Normal rate and regular rhythm.     Heart sounds: S1 normal and S2 normal. Murmur heard.     Systolic murmur is present with a grade of 2/6.  Pulmonary:     Breath sounds: No decreased breath sounds, wheezing, rhonchi or rales.  Abdominal:     Palpations: Abdomen is soft.     Tenderness: There is no abdominal tenderness.  Musculoskeletal:     Right lower leg: No swelling.     Left lower leg: No swelling.  Neurological:     Mental Status: She is alert.     Comments: Intermittent slurred speech.  4 out of 5 power bilateral upper  and lower extremities.  Able to straight leg raise.  Right leg a little weaker than left.     Data Reviewed: CT scan of the head showed no evidence of acute intracranial abnormality, moderate to severe chronic small vessel ischemic disease and moderate cerebral atrophy Creatinine 0.96, white blood cell count 8.8, hemoglobin 12.1, platelet count 251.  Blood cultures negative for less than 12 hours, urine culture still pending  Family Communication: Updated sister on the phone  Disposition: Status is: Inpatient Remains inpatient appropriate because: Will repeat a CT scan of the head tomorrow, continue IV Rocephin.  Follow-up urine culture.   Planned Discharge Destination: Home with Home Health    Time spent: 28 minutes  Author: Alford Highland, MD 09/01/2023 12:41 PM  For on call review [email or web address].

## 2023-09-01 NOTE — Care Management CC44 (Signed)
 Condition Code 44 Documentation Completed  Patient Details  Name: Sarai January MRN: [id number] Date of Birth: 03/22/1951   Condition Code 44 given:  Yes Patient signature on Condition Code 44 notice:  Yes Documentation of 2 MD's agreement:  Yes Code 44 added to claim:  Yes    Erin Sons, LCSW 09/01/2023, 2:51 PM

## 2023-09-01 NOTE — Assessment & Plan Note (Signed)
 Restart low-dose gabapentin

## 2023-09-01 NOTE — Assessment & Plan Note (Addendum)
 Suspect secondary to acute pyelonephritis but will repeat a CT scan of the head tomorrow to make sure she does not have a stroke.  Unable to get an MRI secondary to having a pacemaker.  Continue aspirin and Lipitor.  Patient answering questions and following commands.

## 2023-09-01 NOTE — ED Notes (Signed)
 Pt started to desat to 89-91% on RA, repositioned Pt with slight improvement. Pt it sitting between 91-92%, placed on 2L [place] @ this time. Pt O2 sats increased to 95%

## 2023-09-01 NOTE — Assessment & Plan Note (Signed)
 Follow-up urine culture.  Patient has pain in her right back.  Continue IV Rocephin.

## 2023-09-01 NOTE — Care Management Obs Status (Signed)
 MEDICARE OBSERVATION STATUS NOTIFICATION   Patient Details  Name: Kari Hughes MRN: [id number] Date of Birth: 1951-03-31   Medicare Observation Status Notification Given:  Yes    Hailea Eaglin Aris Lot, LCSW 09/01/2023, 2:51 PM

## 2023-09-01 NOTE — Progress Notes (Addendum)
 PT Cancellation Note  Patient Details Name: Kari Hughes MRN: [id number] DOB: 05-15-1951   Cancelled Treatment:    Reason Eval/Treat Not Completed: Medical issues which prohibited therapy Chart reviewed, spoke with RN who reports pt just had episode of vomiting, shortness of breath and general distress/feeling very poorly.  RN suggested strongly that she would not be appropriate for PT at this point and to let her rest today.  Will maintain on caseload and attempt to see when appropriate.   Malachi Pro, DPT 09/01/2023, 3:21 PM

## 2023-09-01 NOTE — ED Notes (Signed)
 PT was @ bedside working with Pt. Pt was able to get to side of bed, PT asked for assistance to place back into bed. Pt then report 10/10 pain on her R side back

## 2023-09-01 NOTE — Progress Notes (Signed)
 Called to see patient secondary to diaphoresis shortness of breath and vomiting.  No complaints of chest pain.  Physical Exam Cardiovascular:     Rate and Rhythm: Normal rate and regular rhythm.     Heart sounds: S1 normal and S2 normal. Murmur heard.     Systolic murmur is present with a grade of 2/6.  Pulmonary:     Breath sounds: Examination of the right-lower field reveals decreased breath sounds. Examination of the left-lower field reveals decreased breath sounds. Decreased breath sounds present. No wheezing, rhonchi or rales.  Abdominal:     Tenderness: There is generalized abdominal tenderness.     Plan.  EKG reviewed by me shows normal sinus rhythm 67 bpm no acute ST-T wave changes.  Will change pain medication to IV morphine.  Will give IV Phenergan for vomiting.  Will send off 2 troponins.  Plan will still be repeat a CT scan of the head tomorrow morning and CT renal stone protocol.  Dr. Alford Highland 16 minutes

## 2023-09-01 NOTE — ED Notes (Addendum)
 Pt used the call light, upon entering room , Pt was diaphoretic and stating she does not feel right. Within minutes Pt started vomiting multiple times. Pt was then SOB. VS were obtained and WNL. MD notified via secure chat. PRN zofran administered, MD came to bedside. Pt was cleaned up, new gown placed. EKG obtained and troponin sent off. Pt denies CP.

## 2023-09-02 ENCOUNTER — Observation Stay: Payer: 59

## 2023-09-02 DIAGNOSIS — F411 Generalized anxiety disorder: Secondary | ICD-10-CM | POA: Diagnosis present

## 2023-09-02 DIAGNOSIS — B961 Klebsiella pneumoniae [K. pneumoniae] as the cause of diseases classified elsewhere: Secondary | ICD-10-CM | POA: Diagnosis present

## 2023-09-02 DIAGNOSIS — F17290 Nicotine dependence, other tobacco product, uncomplicated: Secondary | ICD-10-CM | POA: Diagnosis present

## 2023-09-02 DIAGNOSIS — G9341 Metabolic encephalopathy: Secondary | ICD-10-CM | POA: Diagnosis present

## 2023-09-02 DIAGNOSIS — G894 Chronic pain syndrome: Secondary | ICD-10-CM | POA: Diagnosis present

## 2023-09-02 DIAGNOSIS — I160 Hypertensive urgency: Secondary | ICD-10-CM | POA: Diagnosis present

## 2023-09-02 DIAGNOSIS — Z950 Presence of cardiac pacemaker: Secondary | ICD-10-CM | POA: Diagnosis not present

## 2023-09-02 DIAGNOSIS — N10 Acute tubulo-interstitial nephritis: Secondary | ICD-10-CM | POA: Diagnosis present

## 2023-09-02 DIAGNOSIS — G629 Polyneuropathy, unspecified: Secondary | ICD-10-CM | POA: Diagnosis present

## 2023-09-02 DIAGNOSIS — N12 Tubulo-interstitial nephritis, not specified as acute or chronic: Secondary | ICD-10-CM | POA: Diagnosis present

## 2023-09-02 DIAGNOSIS — E785 Hyperlipidemia, unspecified: Secondary | ICD-10-CM | POA: Diagnosis present

## 2023-09-02 DIAGNOSIS — Z1152 Encounter for screening for COVID-19: Secondary | ICD-10-CM | POA: Diagnosis not present

## 2023-09-02 DIAGNOSIS — E669 Obesity, unspecified: Secondary | ICD-10-CM | POA: Diagnosis present

## 2023-09-02 DIAGNOSIS — M797 Fibromyalgia: Secondary | ICD-10-CM | POA: Diagnosis present

## 2023-09-02 DIAGNOSIS — F319 Bipolar disorder, unspecified: Secondary | ICD-10-CM | POA: Diagnosis present

## 2023-09-02 DIAGNOSIS — Z811 Family history of alcohol abuse and dependence: Secondary | ICD-10-CM | POA: Diagnosis not present

## 2023-09-02 DIAGNOSIS — Z79899 Other long term (current) drug therapy: Secondary | ICD-10-CM | POA: Diagnosis not present

## 2023-09-02 DIAGNOSIS — Z6831 Body mass index (BMI) 31.0-31.9, adult: Secondary | ICD-10-CM | POA: Diagnosis not present

## 2023-09-02 DIAGNOSIS — Z813 Family history of other psychoactive substance abuse and dependence: Secondary | ICD-10-CM | POA: Diagnosis not present

## 2023-09-02 DIAGNOSIS — Z7982 Long term (current) use of aspirin: Secondary | ICD-10-CM | POA: Diagnosis not present

## 2023-09-02 DIAGNOSIS — I10 Essential (primary) hypertension: Secondary | ICD-10-CM | POA: Diagnosis present

## 2023-09-02 DIAGNOSIS — J45909 Unspecified asthma, uncomplicated: Secondary | ICD-10-CM | POA: Diagnosis present

## 2023-09-02 DIAGNOSIS — F17210 Nicotine dependence, cigarettes, uncomplicated: Secondary | ICD-10-CM | POA: Diagnosis present

## 2023-09-02 DIAGNOSIS — R404 Transient alteration of awareness: Secondary | ICD-10-CM | POA: Diagnosis present

## 2023-09-02 NOTE — Evaluation (Signed)
 Physical Therapy Evaluation Patient Details Name: Kari Hughes MRN: [id number] DOB: [birth year] Today's Date: 09/02/2023  History of Present Illness  [name] is a 73 year old female with history of hypertension, neuropathy, depression, anxiety, hyperlipidemia, who presents emergency department from home for chief concerns of altered mental status, urinary incontinence in her bed.   Clinical Impression  Patient received in bed, she is agreeable to PT session. Reports "I know what I need to do to get home". Patient reports back pain in bed. Required mod A for rolling and sidelying to sit. She is able to stand with min A and RW. Limited by pain and anxiety. Patient was able to take a few steps to recliner with min A. She will continue to benefit from skilled PT to improve mobility and independence.          If plan is discharge home, recommend the following: A lot of help with walking and/or transfers;A little help with bathing/dressing/bathroom;Assist for transportation   Can travel by private vehicle   No    Equipment Recommendations None recommended by PT  Recommendations for Other Services       Functional Status Assessment Patient has had a recent decline in their functional status and demonstrates the ability to make significant improvements in function in a reasonable and predictable amount of time.     Precautions / Restrictions Precautions Precautions: Fall Restrictions Weight Bearing Restrictions Per Provider Order: No      Mobility  Bed Mobility Overal bed mobility: Needs Assistance Bed Mobility: Rolling, Sidelying to Sit Rolling: Mod assist Sidelying to sit: Mod assist       General bed mobility comments: Cues and assist needed for bed mobility    Transfers Overall transfer level: Needs assistance Equipment used: Rolling walker (2 wheels) Transfers: Sit to/from Stand             General transfer comment: Patient able to stand 2x with min A,  cues for safe hand placement.    Ambulation/Gait Ambulation/Gait assistance: Min assist Gait Distance (Feet): 3 Feet Assistive device: Rolling walker (2 wheels) Gait Pattern/deviations: Step-to pattern, Decreased step length - right, Decreased step length - left Gait velocity: decreased     General Gait Details: able to step to recliner with min A, pain and anxiety limited  Stairs            Wheelchair Mobility     Tilt Bed    Modified Rankin (Stroke Patients Only)       Balance Overall balance assessment: Needs assistance Sitting-balance support: Feet supported Sitting balance-Leahy Scale: Good     Standing balance support: Bilateral upper extremity supported, During functional activity, Reliant on assistive device for balance Standing balance-Leahy Scale: Good                               Pertinent Vitals/Pain Pain Assessment Pain Assessment: Faces Faces Pain Scale: Hurts even more Pain Location: R flank/low bakc pain Pain Descriptors / Indicators: Grimacing, Constant, Sore, Shooting, Sharp Pain Intervention(s): Monitored during session, Repositioned    Home Living Family/patient expects to be discharged to:: Private residence Living Arrangements: Alone Available Help at Discharge: Friend(s);Family;Personal care attendant;Available 24 hours/day Type of Home: Apartment         Home Layout: One level Home Equipment: Educational psychologist (4 wheels);Grab bars - toilet;Grab bars - tub/shower Additional Comments: ramp    Prior Function Prior Level of Function : History of Falls (  last six months);Needs assist       Physical Assist : ADLs (physical);Mobility (physical) Mobility (physical): Gait ADLs (physical): Bathing;Dressing;IADLs Mobility Comments: Pt reports she uses a walker for mobility, generally able to get up and around her apartment herself, but does sometimes require assistance from staff on occsion. Pt denies falls history,  however, per chart she had multiple falls ~4 months PTA. ADLs Comments: Pt has an aid that comes 2 hours each day to assist with IADLs, pt reports she has recently needed increased assistance for ADLs.     Extremity/Trunk Assessment   Upper Extremity Assessment Upper Extremity Assessment: Generalized weakness    Lower Extremity Assessment Lower Extremity Assessment: Generalized weakness       Communication   Communication Communication: No apparent difficulties    Cognition Arousal: Alert Behavior During Therapy: Anxious   PT - Cognitive impairments: No apparent impairments                         Following commands: Intact       Cueing Cueing Techniques: Verbal cues, Gestural cues     General Comments      Exercises     Assessment/Plan    PT Assessment Patient needs continued PT services  PT Problem List Decreased strength;Decreased activity tolerance;Decreased balance;Decreased mobility;Pain       PT Treatment Interventions DME instruction;Gait training;Functional mobility training;Therapeutic activities;Therapeutic exercise;Balance training;Neuromuscular re-education;Patient/family education    PT Goals (Current goals can be found in the Care Plan section)  Acute Rehab PT Goals Patient Stated Goal: to return home PT Goal Formulation: With patient Time For Goal Achievement: 09/11/23 Potential to Achieve Goals: Good    Frequency Min 1X/week     Co-evaluation               AM-PAC PT "6 Clicks" Mobility  Outcome Measure Help needed turning from your back to your side while in a flat bed without using bedrails?: A Lot Help needed moving from lying on your back to sitting on the side of a flat bed without using bedrails?: A Lot Help needed moving to and from a bed to a chair (including a wheelchair)?: A Little Help needed standing up from a chair using your arms (e.g., wheelchair or bedside chair)?: A Little Help needed to walk in  hospital room?: A Lot Help needed climbing 3-5 steps with a railing? : A Lot 6 Click Score: 14    End of Session Equipment Utilized During Treatment: Oxygen Activity Tolerance: Patient limited by pain;Other (comment) (anxiety) Patient left: in chair;with call bell/phone within reach;with chair alarm set Nurse Communication: Mobility status PT Visit Diagnosis: Other abnormalities of gait and mobility (R26.89);Muscle weakness (generalized) (M62.81);Difficulty in walking, not elsewhere classified (R26.2);Pain Pain - part of body:  (back)    Time: 1610-9604 PT Time Calculation (min) (ACUTE ONLY): 15 min   Charges:   PT Evaluation $PT Eval Moderate Complexity: 1 Mod   PT General Charges $$ ACUTE PT VISIT: 1 Visit         Patrizia Paule, PT, GCS 09/02/23,11:02 AM

## 2023-09-02 NOTE — Plan of Care (Signed)

## 2023-09-02 NOTE — Progress Notes (Signed)
 PROGRESS NOTE    [name]   ZOX:[id number] DOB: 1951-05-06  DOA: 08/31/2023 Date of Service: 09/02/23 which is hospital day 1  PCP: Enid Baas, MD      Hospital course / significant events:   HPI: [name] is a 73 year old female with history of hypertension, neuropathy, depression, anxiety, hyperlipidemia, who presents emergency department from home for chief concerns of altered mental status, urinary incontinence in her bed.  02/17: UA was positive for trace leukocytes and nitrates. ED treatment: Ceftriaxone 2 g IV one-time dose, LR infusion at 150 mL/h, losartan 50 mg p.o. one-time dose. Admitted to hospitalist service.  2/18.  Patient having pain in her right back, suspect pyelonephritis.  Also had some slurred speech and confusion coming in - initial CT scan of the head was negative.  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 02/19: CT head nonacute. CT renal nonacute UCx (+)Klebsiella, Susceptibilities pending. SNF rehab placement pending.      Consultants:  none  Procedures/Surgeries: none      ASSESSMENT & PLAN:   Acute pyelonephritis Follow-up urine culture --> (+)Klebsiella, Susceptibilities pending Continue IV Rocephin.   Acute metabolic encephalopathy - resolved  Suspect secondary to acute pyelonephritis  Continue aspirin and Lipitor.   Hypertensive urgency Blood pressure better controlled now continue Norvasc and Cozaar   Hyperlipidemia Continue Lipitor   GAD (generalized anxiety disorder) Ativan 0.5 mg p.o. every 6 hours as needed for anxiety, 2 doses ordered on admission   Chronic pain syndrome Fibromyalgia Restart gabapentin lower dose   Anxiety As needed Ativan, restart Prozac   Chest pain Ruled out ACS Monitor       Class 1 obesity based on BMI: Body mass index is 31.24 kg/m.  Underweight - under 18  overweight - 25 to 29 obese - 30 or more Class 1  obesity: BMI of 30.0 to 34 Class 2 obesity: BMI of 35.0 to 39 Class 3 obesity: BMI of 40.0 to 49 Super Morbid Obesity: BMI 50-59 Super-super Morbid Obesity: BMI 60+ Significantly low or high BMI is associated with higher medical risk.  Weight management advised as adjunct to other disease management and risk reduction treatments    DVT prophylaxis: lovenox  IV fluids: no continuous IV fluids  Nutrition: cardiac diet  Central lines / invasive devices: none  Code Status: FULL CODE ACP documentation reviewed: none on file in [facility]  TOC needs: placement Barriers to dispo / significant pending items: placement, urine culture results, anticipate medically ready by tomorrow 02/20             Subjective / Brief ROS:  Patient reports weak overall, (+)back pain  Denies CP/SOB.  Pain controlled.  Denies new weakness.  Tolerating diet.  Reports no concerns w/ urination/defecation.   Family Communication: support person at bedside on rounds    Objective Findings:  Vitals:   09/01/23 2032 09/02/23 0526 09/02/23 0817 09/02/23 1512  BP: 127/78 137/85 133/79 108/62  Pulse: 63 62 62 63  Resp:  20 20 19   Temp: 98 F (36.7 C) (!) 97.5 F (36.4 C) 98.2 F (36.8 C) 98 F (36.7 C)  TempSrc:    Oral  SpO2: 96% 100% 100% 95%  Weight:      Height:        Intake/Output Summary (Last 24 hours) at 09/02/2023 1520 Last data filed at 09/02/2023 1100 Gross per 24 hour  Intake 220 ml  Output 400 ml  Net -180 ml   Filed Weights   08/31/23 1143  Weight: 75 kg    Examination:  Physical Exam Constitutional:      General: She is not in acute distress. Cardiovascular:     Rate and Rhythm: Normal rate and regular rhythm.  Pulmonary:     Effort: Pulmonary effort is normal.     Breath sounds: Normal breath sounds.  Abdominal:     General: Abdomen is flat.     Palpations: Abdomen is soft.  Skin:    General: Skin is warm and dry.  Neurological:     General: No focal deficit  present.     Mental Status: She is alert and oriented to person, place, and time.  Psychiatric:        Mood and Affect: Mood normal.        Behavior: Behavior normal.          Scheduled Medications:   amLODipine  10 mg Oral Daily   aspirin EC  81 mg Oral Daily   atorvastatin  40 mg Oral QHS   enoxaparin (LOVENOX) injection  40 mg Subcutaneous Q24H   FLUoxetine  20 mg Oral Daily   gabapentin  300 mg Oral TID   losartan  50 mg Oral Daily   sodium chloride flush  3 mL Intravenous Once    Continuous Infusions:  cefTRIAXone (ROCEPHIN)  IV 2 g (09/01/23 1819)    PRN Medications:  acetaminophen **OR** acetaminophen, albuterol, hydrALAZINE, HYDROcodone-acetaminophen, LORazepam, morphine injection, nitroGLYCERIN, ondansetron **OR** ondansetron (ZOFRAN) IV  Antimicrobials from admission:  Anti-infectives (From admission, onward)    Start     Dose/Rate Route Frequency Ordered Stop   09/01/23 1800  cefTRIAXone (ROCEPHIN) 2 g in sodium chloride 0.9 % 100 mL IVPB        2 g 200 mL/hr over 30 Minutes Intravenous Every 24 hours 08/31/23 1718 09/05/23 1759   08/31/23 1715  cefTRIAXone (ROCEPHIN) 2 g in sodium chloride 0.9 % 100 mL IVPB        2 g 200 mL/hr over 30 Minutes Intravenous Once 08/31/23 1701 08/31/23 1806           Data Reviewed:  I have personally reviewed the following...  CBC: Recent Labs  Lab 08/31/23 1137 09/01/23 0732  WBC 11.7* 8.8  NEUTROABS 8.7*  --   HGB 14.7 12.1  HCT 45.7 37.6  MCV 85.6 85.1  PLT 311 251   Basic Metabolic Panel: Recent Labs  Lab 08/31/23 1137 09/01/23 0732  NA 141 140  K 3.9 4.1  CL 106 109  CO2 22 22  GLUCOSE 102* 98  BUN 15 12  CREATININE 0.92 0.96  CALCIUM 9.6 8.7*   GFR: Estimated Creatinine Clearance: 49.1 mL/min (by C-G formula based on SCr of 0.96 mg/dL). Liver Function Tests: Recent Labs  Lab 08/31/23 1137  AST 13*  ALT 12  ALKPHOS 71  BILITOT 0.6  PROT 7.8  ALBUMIN 4.2   No results for input(s):  "LIPASE", "AMYLASE" in the last 168 hours. No results for input(s): "AMMONIA" in the last 168 hours. Coagulation Profile: Recent Labs  Lab 08/31/23 1724  INR 1.1   Cardiac Enzymes: No results for input(s): "CKTOTAL", "CKMB", "CKMBINDEX", "TROPONINI" in the last 168 hours. BNP (last 3 results) No results for input(s): "PROBNP" in the last 8760 hours. HbA1C: No results for input(s): "HGBA1C" in the last 72 hours. CBG: Recent Labs  Lab 08/31/23 1136  GLUCAP 96   Lipid Profile: No results for  input(s): "CHOL", "HDL", "LDLCALC", "TRIG", "CHOLHDL", "LDLDIRECT" in the last 72 hours. Thyroid Function Tests: No results for input(s): "TSH", "T4TOTAL", "FREET4", "T3FREE", "THYROIDAB" in the last 72 hours. Anemia Panel: No results for input(s): "VITAMINB12", "FOLATE", "FERRITIN", "TIBC", "IRON", "RETICCTPCT" in the last 72 hours. Most Recent Urinalysis On File:     Component Value Date/Time   COLORURINE YELLOW (A) 08/31/2023 1622   APPEARANCEUR HAZY (A) 08/31/2023 1622   LABSPEC 1.020 08/31/2023 1622   PHURINE 5.0 08/31/2023 1622   GLUCOSEU NEGATIVE 08/31/2023 1622   HGBUR MODERATE (A) 08/31/2023 1622   BILIRUBINUR NEGATIVE 08/31/2023 1622   KETONESUR NEGATIVE 08/31/2023 1622   PROTEINUR 100 (A) 08/31/2023 1622   NITRITE POSITIVE (A) 08/31/2023 1622   LEUKOCYTESUR TRACE (A) 08/31/2023 1622   Sepsis Labs: @LABRCNTIP (procalcitonin:4,lacticidven:4) Microbiology: Recent Results (from the past 240 hours)  Resp panel by RT-PCR (RSV, Flu A&B, Covid) Anterior Nasal Swab     Status: None   Collection Time: 08/31/23  4:22 PM   Specimen: Anterior Nasal Swab  Result Value Ref Range Status   SARS Coronavirus 2 by RT PCR NEGATIVE NEGATIVE Final    Comment: (NOTE) SARS-CoV-2 target nucleic acids are NOT DETECTED.  The SARS-CoV-2 RNA is generally detectable in upper respiratory specimens during the acute phase of infection. The lowest concentration of SARS-CoV-2 viral copies this assay can  detect is 138 copies/mL. A negative result does not preclude SARS-Cov-2 infection and should not be used as the sole basis for treatment or other patient management decisions. A negative result may occur with  improper specimen collection/handling, submission of specimen other than nasopharyngeal swab, presence of viral mutation(s) within the areas targeted by this assay, and inadequate number of viral copies(<138 copies/mL). A negative result must be combined with clinical observations, patient history, and epidemiological information. The expected result is Negative.  Fact Sheet for Patients:  BloggerCourse.com  Fact Sheet for Healthcare Providers:  SeriousBroker.it  This test is no t yet approved or cleared by the [place] FDA and  has been authorized for detection and/or diagnosis of SARS-CoV-2 by FDA under an Emergency Use Authorization (EUA). This EUA will remain  in effect (meaning this test can be used) for the duration of the COVID-19 declaration under Section 564(b)(1) of the Act, 21 U.S.C.section 360bbb-3(b)(1), unless the authorization is terminated  or revoked sooner.       Influenza A by PCR NEGATIVE NEGATIVE Final   Influenza B by PCR NEGATIVE NEGATIVE Final    Comment: (NOTE) The Xpert Xpress SARS-CoV-2/FLU/RSV plus assay is intended as an aid in the diagnosis of influenza from Nasopharyngeal swab specimens and should not be used as a sole basis for treatment. Nasal washings and aspirates are unacceptable for Xpert Xpress SARS-CoV-2/FLU/RSV testing.  Fact Sheet for Patients: BloggerCourse.com  Fact Sheet for Healthcare Providers: SeriousBroker.it  This test is not yet approved or cleared by the [place] FDA and has been authorized for detection and/or diagnosis of SARS-CoV-2 by FDA under an Emergency Use Authorization (EUA). This EUA will remain in  effect (meaning this test can be used) for the duration of the COVID-19 declaration under Section 564(b)(1) of the Act, 21 U.S.C. section 360bbb-3(b)(1), unless the authorization is terminated or revoked.     Resp Syncytial Virus by PCR NEGATIVE NEGATIVE Final    Comment: (NOTE) Fact Sheet for Patients: BloggerCourse.com  Fact Sheet for Healthcare Providers: SeriousBroker.it  This test is not yet approved or cleared by the [place] and has been authorized for  detection and/or diagnosis of SARS-CoV-2 by FDA under an Emergency Use Authorization (EUA). This EUA will remain in effect (meaning this test can be used) for the duration of the COVID-19 declaration under Section 564(b)(1) of the Act, 21 U.S.C. section 360bbb-3(b)(1), unless the authorization is terminated or revoked.  Performed at [facility], [address] [place], [place] [address]   Urine Culture     Status: Abnormal (Preliminary result)   Collection Time: 08/31/23  4:22 PM   Specimen: Urine, Catheterized  Result Value Ref Range Status   Specimen Description   Final    URINE, CATHETERIZED Performed at [facility], [address] [place], [place] [address]    Special Requests   Final    NONE Performed at [facility], [address] [place], [place] [address]    Culture >=100,000 COLONIES/mL KLEBSIELLA PNEUMONIAE (A)  Final   Report Status PENDING  Incomplete  Blood culture (routine x 2)     Status: None (Preliminary result)   Collection Time: 08/31/23  5:13 PM   Specimen: BLOOD  Result Value Ref Range Status   Specimen Description BLOOD BLOOD LEFT ARM  Final   Special Requests   Final    Blood Culture results may not be optimal due to an inadequate volume of blood received in culture bottles   Culture   Final    NO GROWTH 2 DAYS Performed at [facility], [address] [place], [place] [address]     Report Status PENDING  Incomplete  Blood culture (routine x 2)     Status: None (Preliminary result)   Collection Time: 08/31/23  5:13 PM   Specimen: Left Antecubital; Blood  Result Value Ref Range Status   Specimen Description LEFT ANTECUBITAL  Final   Special Requests NONE  Final   Culture   Final    NO GROWTH 2 DAYS Performed at [facility], [address] [place], [place] [address]    Report Status PENDING  Incomplete      Radiology Studies last 3 days: CT HEAD WO CONTRAST ( ) Result Date: 09/02/2023 CLINICAL DATA:  73 year old female with altered mental status, vomiting. EXAM: CT HEAD WITHOUT CONTRAST TECHNIQUE: Contiguous axial images were obtained from the base of the skull through the vertex without intravenous contrast. RADIATION DOSE REDUCTION: This exam was performed according to the departmental dose-optimization program which includes automated exposure control, adjustment of the mA and/or kV according to patient size and/or use of iterative reconstruction technique. COMPARISON:  Head CT 08/31/2023. FINDINGS: Brain: Stable cerebral volume. No midline shift, mass effect, or evidence of intracranial mass lesion. No acute intracranial hemorrhage identified. Stable ventricle size and configuration. Confluent cerebral white matter hypodensity, heterogeneity in the bilateral deep gray nuclei. Stable gray-white matter differentiation throughout the brain. No cortically based acute infarct identified. Vascular: No suspicious intracranial vascular hyperdensity. Calcified atherosclerosis at the skull base. Skull: Intact.  No acute osseous abnormality identified. Sinuses/Orbits: Visualized paranasal sinuses and mastoids are stable and well aerated. Other: No acute orbit or scalp soft tissue finding. IMPRESSION: 1. No acute intracranial abnormality identified. 2. Stable non contrast CT appearance of advanced small vessel disease. Electronically Signed   By: Odessa Fleming M.D.   On:  09/02/2023 08:57   CT RENAL STONE STUDY Result Date: 09/02/2023 CLINICAL DATA:  Flank pain, vomiting. EXAM: CT ABDOMEN AND PELVIS WITHOUT CONTRAST TECHNIQUE: Multidetector CT imaging of the abdomen and pelvis was performed following the standard protocol without IV contrast. RADIATION  DOSE REDUCTION: This exam was performed according to the departmental dose-optimization program which includes automated exposure control, adjustment of the mA and/or kV according to patient size and/or use of iterative reconstruction technique. COMPARISON:  None Available. FINDINGS: Lower chest: No acute abnormality. Hepatobiliary: No focal liver abnormality is seen. No gallstones, gallbladder wall thickening, or biliary dilatation. Pancreas: Unremarkable. No pancreatic ductal dilatation or surrounding inflammatory changes. Spleen: Normal in size without focal abnormality. Adrenals/Urinary Tract: Adrenal glands are unremarkable. Kidneys are normal, without renal calculi, focal lesion, or hydronephrosis. Bladder is unremarkable. Stomach/Bowel: Stomach is within normal limits. Appendix appears normal. No evidence of bowel wall thickening, distention, or inflammatory changes. Diverticulosis of descending and sigmoid colon is noted without inflammation. Vascular/Lymphatic: No significant vascular findings are present. No enlarged abdominal or pelvic lymph nodes. Reproductive: Status post hysterectomy. No adnexal masses. Other: No abdominal wall hernia or abnormality. No abdominopelvic ascites. Musculoskeletal: No acute or significant osseous findings. IMPRESSION: No acute abnormality seen in the abdomen or pelvis. Electronically Signed   By: Lupita Raider M.D.   On: 09/02/2023 08:23   DG Chest Port 1 View Result Date: 08/31/2023 CLINICAL DATA:  Altered mental status. EXAM: PORTABLE CHEST 1 VIEW COMPARISON:  04/09/2023 FINDINGS: Left-sided pacemaker remains in place. Upper normal heart size with stable mediastinal contours. Chronic  elevation of right hemidiaphragm. Mild left lung base atelectasis. No confluent consolidation. No pulmonary edema or pleural effusion. No pneumothorax. IMPRESSION: Mild left lung base atelectasis. Chronic elevation of right hemidiaphragm. Electronically Signed   By: Narda Rutherford M.D.   On: 08/31/2023 19:24   CT HEAD WO CONTRAST Result Date: 08/31/2023 CLINICAL DATA:  Mental status change, unknown cause. Headache, neuro deficit. Dysarthria, altered mental status, and dizziness. History of stroke. EXAM: CT HEAD WITHOUT CONTRAST TECHNIQUE: Contiguous axial images were obtained from the base of the skull through the vertex without intravenous contrast. RADIATION DOSE REDUCTION: This exam was performed according to the departmental dose-optimization program which includes automated exposure control, adjustment of the mA and/or kV according to patient size and/or use of iterative reconstruction technique. COMPARISON:  CTA head and neck 04/10/2023 FINDINGS: Brain: There is no evidence of an acute infarct, intracranial hemorrhage, mass, midline shift, or extra-axial fluid collection. Patchy hypodensities in the cerebral white matter bilaterally are stable to mildly progressed and nonspecific but compatible with moderate to severe chronic small vessel ischemic disease. There are chronic lacunar infarcts in the basal ganglia regions bilaterally which have mildly increased in number. Moderate, central predominant cerebral atrophy is similar to the prior study. Vascular: Calcified atherosclerosis at the skull base. No hyperdense vessel. Skull: No acute fracture or suspicious osseous lesion. Sinuses/Orbits: Visualized paranasal sinuses and mastoid air cells are clear. Unremarkable orbits. Other: None. IMPRESSION: 1. No evidence of acute intracranial abnormality. 2. Moderate to severe chronic small vessel ischemic disease and moderate cerebral atrophy. Electronically Signed   By: Sebastian Ache M.D.   On: 08/31/2023 14:51           Sunnie Nielsen, DO [facility] 09/02/2023, 3:20 PM    Dictation software may have been used to generate the above note. Typos may occur and escape review in typed/dictated notes. Please contact Dr Lyn Hollingshead directly for clarity if needed.  Staff may message me via secure chat in Epic  but this may not receive an immediate response,  please page me for urgent matters!  If 7PM-7AM, please contact night coverage [email or web address]

## 2023-09-02 NOTE — [place] FL2 (Signed)
 [place] MEDICAID FL2 LEVEL OF CARE FORM     IDENTIFICATION  Patient Name: Kari Hughes Birthdate: January 02, 1951 Sex: female Admission Date (Current Location): 08/31/2023  [facility] and [place] Number:  Chiropodist and Address:  [facility], [address] [place], [place] [address]      Provider Number: [id number]  Attending Physician Name and Address:  Sunnie Nielsen, DO  Relative Name and Phone Number:  Charlean Sanfilippo (Sister)  [phone]    Current Level of Care: Hospital Recommended Level of Care: Skilled Nursing Facility Prior Approval Number:    Date Approved/Denied:   PASRR Number: pending  Discharge Plan: SNF    Current Diagnoses: Patient Active Problem List   Diagnosis Date Noted   Acute pyelonephritis 09/01/2023   Acute metabolic encephalopathy 09/01/2023   Nausea and vomiting 09/01/2023   Diaphoresis 09/01/2023   Shortness of breath 09/01/2023   Hypertensive urgency 08/31/2023   Arthritis 04/11/2023   Vitamin D deficiency 04/11/2023   Falls 04/10/2023   Obesity (BMI 30-39.9) 04/10/2023   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   GAD (generalized anxiety disorder) 12/27/2020   Cannabis abuse, episodic use 12/27/2020   Lumbar degenerative disc disease 10/09/2020   Lumbar facet arthropathy 10/09/2020   Spinal stenosis 10/09/2020   Bipolar disorder (HCC) 07/12/2020   Marijuana use 07/12/2020   On anticoagulant therapy 07/12/2020   Nerve pain 07/12/2020   Right otitis media 07/03/2020   Pharyngitis 07/03/2020   MDD (major depressive disorder), recurrent episode, moderate (HCC)    Fibromyalgia    Chronic pain syndrome    Acute non-recurrent sinusitis 06/01/2020   Anxiety 06/01/2020   Encounter for medication refill 06/01/2020   No-show for appointment 05/28/2020   Chronic bilateral low back pain with bilateral sciatica 05/11/2020   Chronic joint pain 05/11/2020   Change in vision 05/11/2020   Body mass index  26.0-26.9, adult 05/11/2020   Hyperlipidemia 05/10/2020   Pacemaker 05/08/2020   Lumbar radiculopathy 03/30/2020    Orientation RESPIRATION BLADDER Height & Weight     Self, Time, Situation, Place  O2 Incontinent Weight: 165 lb 5.5 oz (75 kg) Height:  5\' 1"  (154.9 cm)  BEHAVIORAL SYMPTOMS/MOOD NEUROLOGICAL BOWEL NUTRITION STATUS      Continent    AMBULATORY STATUS COMMUNICATION OF NEEDS Skin   Limited Assist Verbally Normal                       Personal Care Assistance Level of Assistance  Bathing, Dressing, Feeding Bathing Assistance: Limited assistance Feeding assistance: Limited assistance Dressing Assistance: Limited assistance     Functional Limitations Info  Sight, Hearing, Speech Sight Info: Adequate Hearing Info: Adequate Speech Info: Adequate    SPECIAL CARE FACTORS FREQUENCY  PT (By licensed PT), OT (By licensed OT)     PT Frequency: 5 times a week OT Frequency: 5 times a week            Contractures Contractures Info: Not present    Additional Factors Info  Code Status Code Status Info: FULL             Current Medications (09/02/2023):  This is the current hospital active medication list Current Facility-Administered Medications  Medication Dose Route Frequency Provider Last Rate Last Admin   acetaminophen (TYLENOL) tablet 650 mg  650 mg Oral Q6H PRN Cox, Amy N, DO       Or   acetaminophen (TYLENOL) suppository 650 mg  650 mg Rectal Q6H PRN Cox, Amy N,  DO       albuterol (PROVENTIL) (2.5 MG/3ML) 0.083% nebulizer solution 3 mL  3 mL Nebulization Q6H PRN Cox, Amy N, DO       amLODipine (NORVASC) tablet 10 mg  10 mg Oral Daily Cox, Amy N, DO   10 mg at 09/02/23 1007   aspirin EC tablet 81 mg  81 mg Oral Daily Cox, Amy N, DO   81 mg at 09/02/23 1007   atorvastatin (LIPITOR) tablet 40 mg  40 mg Oral QHS Cox, Amy N, DO   40 mg at 09/01/23 2049   cefTRIAXone (ROCEPHIN) 2 g in sodium chloride 0.9 % 100 mL IVPB  2 g Intravenous Q24H Cox, Amy N, DO  200 mL/hr at 09/01/23 1819 2 g at 09/01/23 1819   enoxaparin (LOVENOX) injection 40 mg  40 mg Subcutaneous Q24H Cox, Amy N, DO   40 mg at 09/01/23 2049   FLUoxetine (PROZAC) capsule 20 mg  20 mg Oral Daily Alford Highland, MD   20 mg at 09/02/23 1006   gabapentin (NEURONTIN) capsule 300 mg  300 mg Oral TID Alford Highland, MD   300 mg at 09/02/23 1006   hydrALAZINE (APRESOLINE) injection 5 mg  5 mg Intravenous Q6H PRN Cox, Amy N, DO       HYDROcodone-acetaminophen (NORCO/VICODIN) 5-325 MG per tablet 1 tablet  1 tablet Oral Q6H PRN Alford Highland, MD   1 tablet at 09/02/23 1131   LORazepam (ATIVAN) tablet 0.5 mg  0.5 mg Oral Q6H PRN Cox, Amy N, DO       losartan (COZAAR) tablet 50 mg  50 mg Oral Daily Cox, Amy N, DO   50 mg at 09/02/23 1007   morphine (PF) 2 MG/ML injection 1 mg  1 mg Intravenous Q3H PRN Alford Highland, MD       nitroGLYCERIN (NITROSTAT) SL tablet 0.4 mg  0.4 mg Sublingual Q5 min PRN Wieting, Richard, MD       ondansetron (ZOFRAN) tablet 4 mg  4 mg Oral Q6H PRN Cox, Amy N, DO       Or   ondansetron (ZOFRAN) injection 4 mg  4 mg Intravenous Q6H PRN Cox, Amy N, DO       sodium chloride flush (NS) 0.9 % injection 3 mL  3 mL Intravenous Once Cox, Amy N, DO         Discharge Medications: Please see discharge summary for a list of discharge medications.  Relevant Imaging Results:  Relevant Lab Results:   Additional Information SS [id number]  Allena Katz, LCSW

## 2023-09-02 NOTE — TOC PASRR Note (Signed)
 RE: Kari Hughes Date of Birth: [birth year] Date: 09/02/2023     To Whom It May Concern:   Please be advised that the above-named patient will require a short-term nursing home stay - anticipated 30 days or less for rehabilitation and strengthening.  The plan is for return home

## 2023-09-02 NOTE — TOC Initial Note (Signed)
 Transition of Care [facility]) - Initial/Assessment Note    Patient Details  Name: Kari Hughes MRN: [id number] Date of Birth: 1951-02-13  Transition of Care [facility]) CM/SW Contact:    Allena Katz, LCSW Phone Number: 09/02/2023, 12:07 PM  Clinical Narrative:                 Pt admitted from home alone. Pt reports she is from [place] and does not know any rehabs locally but would be agreeable to go. She has a ramp at home and a motorized wheelchair. She is not on oxygen usually at home. CSW to start workup. Pt wants to review star ratings for rehab and then pick a place.  Expected Discharge Plan: Skilled Nursing Facility Barriers to Discharge: Continued Medical Work up   Patient Goals and CMS Choice Patient states their goals for this hospitalization and ongoing recovery are:: go to SNF CMS Medicare.gov Compare Post Acute Care list provided to:: Patient Choice offered to / list presented to : Patient      Expected Discharge Plan and Services       Living arrangements for the past 2 months: Single Family Home                                      Prior Living Arrangements/Services Living arrangements for the past 2 months: Single Family Home Lives with:: Self Patient language and need for interpreter reviewed:: Yes Do you feel safe going back to the place where you live?: Yes        Care giver support system in place?: No (comment)      Activities of Daily Living   ADL Screening (condition at time of admission) Independently performs ADLs?: Yes (appropriate for developmental age) Is the patient deaf or have difficulty hearing?: No Does the patient have difficulty seeing, even when wearing glasses/contacts?: No Does the patient have difficulty concentrating, remembering, or making decisions?: No  Permission Sought/Granted                  Emotional Assessment       Orientation: : Oriented to Self, Oriented to Place, Oriented to  Time, Oriented to  Situation      Admission diagnosis:  Transient alteration of awareness [R40.4] Hypertensive urgency [I16.0] Generalized weakness [R53.1] Acute pyelonephritis [N10] Urinary tract infection with hematuria, site unspecified [N39.0, R31.9] Urinary incontinence, unspecified type [R32] Patient Active Problem List   Diagnosis Date Noted   Acute pyelonephritis 09/01/2023   Acute metabolic encephalopathy 09/01/2023   Nausea and vomiting 09/01/2023   Diaphoresis 09/01/2023   Shortness of breath 09/01/2023   Hypertensive urgency 08/31/2023   Arthritis 04/11/2023   Vitamin D deficiency 04/11/2023   Falls 04/10/2023   Obesity (BMI 30-39.9) 04/10/2023   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   GAD (generalized anxiety disorder) 12/27/2020   Cannabis abuse, episodic use 12/27/2020   Lumbar degenerative disc disease 10/09/2020   Lumbar facet arthropathy 10/09/2020   Spinal stenosis 10/09/2020   Bipolar disorder (HCC) 07/12/2020   Marijuana use 07/12/2020   On anticoagulant therapy 07/12/2020   Nerve pain 07/12/2020   Right otitis media 07/03/2020   Pharyngitis 07/03/2020   MDD (major depressive disorder), recurrent episode, moderate (HCC)    Fibromyalgia    Chronic pain syndrome    Acute non-recurrent sinusitis 06/01/2020   Anxiety 06/01/2020   Encounter for medication refill 06/01/2020   No-show for appointment 05/28/2020  Chronic bilateral low back pain with bilateral sciatica 05/11/2020   Chronic joint pain 05/11/2020   Change in vision 05/11/2020   Body mass index 26.0-26.9, adult 05/11/2020   Hyperlipidemia 05/10/2020   Pacemaker 05/08/2020   Lumbar radiculopathy 03/30/2020   PCP:  Enid Baas, MD Pharmacy:   [facility] [phone] [place], [place] - [address] [place] [address] Phone: [phone] Fax: [phone]  [facility] [address], [place] - [address] [place] [address] Phone: [phone] Fax:  [phone]  [id number] [place], [place] - [address] AT [facility] [address] [place] [address] Phone: [phone] Fax: [phone]     Social Drivers of Health (SDOH) Social History: SDOH Screenings   Food Insecurity: No Food Insecurity (09/01/2023)  Housing: Low Risk  (09/01/2023)  Transportation Needs: No Transportation Needs (09/01/2023)  Utilities: Not At Risk (09/01/2023)  Alcohol Screen: Low Risk  (05/08/2020)  Depression (PHQ2-9): Low Risk  (04/30/2022)  Financial Resource Strain: Low Risk  (03/09/2023)   Received from [facility]  Social Connections: Moderately Isolated (09/01/2023)  Tobacco Use: High Risk (08/31/2023)   SDOH Interventions:     Readmission Risk Interventions     No data to display

## 2023-09-03 DIAGNOSIS — N10 Acute tubulo-interstitial nephritis: Secondary | ICD-10-CM | POA: Diagnosis not present

## 2023-09-03 LAB — URINE CULTURE: Culture: >=100000 — AB

## 2023-09-03 MED ORDER — CEPHALEXIN 500 MG PO CAPS
500.0000 mg | ORAL_CAPSULE | Freq: Two times a day (BID) | ORAL | Status: DC
Start: 2023-09-03 — End: 2023-09-03
  Administered 2023-09-03: 500 mg via ORAL
  Filled 2023-09-03: qty 1

## 2023-09-03 MED ORDER — ACETAMINOPHEN 325 MG PO TABS: 650.0000 mg | ORAL_TABLET | Freq: Four times a day (QID) | ORAL | Status: AC | PRN

## 2023-09-03 MED ORDER — HYDROCODONE-ACETAMINOPHEN 5-325 MG PO TABS: 1.0000 | ORAL_TABLET | Freq: Four times a day (QID) | ORAL | 0 refills | Status: AC | PRN

## 2023-09-03 MED ORDER — CEFDINIR 300 MG PO CAPS
300.0000 mg | ORAL_CAPSULE | Freq: Two times a day (BID) | ORAL | Status: DC
Start: 2023-09-03 — End: 2023-09-03

## 2023-09-03 MED ORDER — CEPHALEXIN 500 MG PO CAPS: 500.0000 mg | ORAL_CAPSULE | Freq: Two times a day (BID) | ORAL | Status: DC

## 2023-09-03 MED ORDER — CEPHALEXIN 500 MG PO CAPS: 500.0000 mg | ORAL_CAPSULE | Freq: Four times a day (QID) | ORAL | Status: AC

## 2023-09-03 MED ORDER — GABAPENTIN 300 MG PO CAPS: 300.0000 mg | ORAL_CAPSULE | Freq: Three times a day (TID) | ORAL | Status: AC

## 2023-09-03 NOTE — Plan of Care (Signed)

## 2023-09-03 NOTE — TOC Progression Note (Signed)
 Transition of Care [facility]) - Progression Note    Patient Details  Name: Etienne Millward MRN: [id number] Date of Birth: September 01, 1950  Transition of Care [facility]) CM/SW Contact  Allena Katz, LCSW Phone Number: 09/03/2023, 9:31 AM  Clinical Narrative:   Glennon Mac: [phone] [id number]    Expected Discharge Plan: Skilled Nursing Facility Barriers to Discharge: Continued Medical Work up  Expected Discharge Plan and Services       Living arrangements for the past 2 months: Single Family Home                                       Social Determinants of Health (SDOH) Interventions SDOH Screenings   Food Insecurity: No Food Insecurity (09/01/2023)  Housing: Low Risk  (09/01/2023)  Transportation Needs: No Transportation Needs (09/01/2023)  Utilities: Not At Risk (09/01/2023)  Alcohol Screen: Low Risk  (05/08/2020)  Depression (PHQ2-9): Low Risk  (04/30/2022)  Financial Resource Strain: Low Risk  (03/09/2023)   Received from [facility]  Social Connections: Moderately Isolated (09/01/2023)  Tobacco Use: High Risk (08/31/2023)    Readmission Risk Interventions     No data to display

## 2023-09-03 NOTE — TOC Transition Note (Addendum)
 Transition of Care [facility]) - Discharge Note   Patient Details  Name: Earnestene Angello MRN: [id number] Date of Birth: 1950/12/28  Transition of Care [facility]) CM/SW Contact:  Allena Katz, LCSW Phone Number: 09/03/2023, 12:40 PM   Clinical Narrative:     Pt has orders to discharge to [facility] rehab [facility] [facility] notified. RN given number for report. DC summary to be sent once in. Medical necessity sent to the unit.  Final next level of care: Skilled Nursing Facility Barriers to Discharge: Barriers Resolved   Patient Goals and CMS Choice Patient states their goals for this hospitalization and ongoing recovery are:: go to [facility] rehab CMS Medicare.gov Compare Post Acute Care list provided to:: Patient Choice offered to / list presented to : Patient      Discharge Placement              Patient [facility] at: [facility] Patient to be transferred to facility by: [facility]   Patient and family notified of of transfer: 09/03/23  Discharge Plan and Services Additional resources added to the After Visit Summary for                                       Social Drivers of Health (SDOH) Interventions SDOH Screenings   Food Insecurity: No Food Insecurity (09/01/2023)  Housing: Low Risk  (09/01/2023)  Transportation Needs: No Transportation Needs (09/01/2023)  Utilities: Not At Risk (09/01/2023)  Alcohol Screen: Low Risk  (05/08/2020)  Depression (PHQ2-9): Low Risk  (04/30/2022)  Financial Resource Strain: Low Risk  (03/09/2023)   Received from [facility]  Social Connections: Moderately Isolated (09/01/2023)  Tobacco Use: High Risk (08/31/2023)     Readmission Risk Interventions     No data to display

## 2023-09-03 NOTE — TOC Progression Note (Signed)
 Transition of Care [facility]) - Progression Note    Patient Details  Name: Henessy Rohrer MRN: [id number] Date of Birth: [birth year]  Transition [facility]) CM/SW Contact  Allena Katz, LCSW Phone Number: 09/03/2023, 11:00 AM  Clinical Narrative:    Berkley Harvey started for Janifer Adie notified.    Expected Discharge Plan: Skilled Nursing Facility Barriers to Discharge: Continued Medical Work up  Expected Discharge Plan and Services       Living arrangements for the past 2 months: Single Family Home                                       Social Determinants of Health (SDOH) Interventions SDOH Screenings   Food Insecurity: No Food Insecurity (09/01/2023)  Housing: Low Risk  (09/01/2023)  Transportation Needs: No Transportation Needs (09/01/2023)  Utilities: Not At Risk (09/01/2023)  Alcohol Screen: Low Risk  (05/08/2020)  Depression (PHQ2-9): Low Risk  (04/30/2022)  Financial Resource Strain: Low Risk  (03/09/2023)   [facility]  Social Connections: Moderately Isolated (09/01/2023)  Tobacco Use: High Risk (08/31/2023)    Readmission Risk Interventions     No data to display

## 2023-09-03 NOTE — TOC Progression Note (Signed)
 Transition of Care [facility]) - Progression Note    Patient Details  Name: Jacquese Cassarino MRN: [id number] Date of Birth: 11-23-1950  Transition of Care [facility]) CM/SW Contact  Allena Katz, LCSW Phone Number: 09/03/2023, 12:28 PM  Clinical Narrative:    Berkley Harvey approved for [facility]  2/20-2/22   AUTH ID [id number]    Expected Discharge Plan: Skilled Nursing Facility Barriers to Discharge: Continued Medical Work up  Expected Discharge Plan and Services       Living arrangements for the past 2 months: Single Family Home                                       Social Determinants of Health (SDOH) Interventions SDOH Screenings   Food Insecurity: No Food Insecurity (09/01/2023)  Housing: Low Risk  (09/01/2023)  Transportation Needs: No Transportation Needs (09/01/2023)  Utilities: Not At Risk (09/01/2023)  Alcohol Screen: Low Risk  (05/08/2020)  Depression (PHQ2-9): Low Risk  (04/30/2022)  Financial Resource Strain: Low Risk  (03/09/2023)   [facility]  Social Connections: Moderately Isolated (09/01/2023)  Tobacco Use: High Risk (08/31/2023)    Readmission Risk Interventions     No data to display

## 2023-09-03 NOTE — TOC Progression Note (Signed)
 Transition of Care [facility]) - Progression Note    Patient Details  Name: Kari Hughes MRN: [id number] Date of Birth: 1951/05/06  Transition of Care [facility]) CM/SW Contact  Allena Katz, LCSW Phone Number: 09/03/2023, 10:29 AM  Clinical Narrative:    CSW spoke with patient. She would like [facility] and understands its a smoke free facility. CSW to start auth.   Expected Discharge Plan: Skilled Nursing Facility Barriers to Discharge: Continued Medical Work up  Expected Discharge Plan and Services       Living arrangements for the past 2 months: Single Family Home                                       Social Determinants of Health (SDOH) Interventions SDOH Screenings   Food Insecurity: No Food Insecurity (09/01/2023)  Housing: Low Risk  (09/01/2023)  Transportation Needs: No Transportation Needs (09/01/2023)  Utilities: Not At Risk (09/01/2023)  Alcohol Screen: Low Risk  (05/08/2020)  Depression (PHQ2-9): Low Risk  (04/30/2022)  Financial Resource Strain: Low Risk  (03/09/2023)   Received [facility]  Social Connections: Moderately Isolated (09/01/2023)  Tobacco Use: High Risk (08/31/2023)    Readmission Risk Interventions     No data to display

## 2023-09-03 NOTE — Care Management Important Message (Signed)
 Important Message  Patient Details  Name: Kari Hughes MRN: [id number] Date of Birth: 02-Feb-1951   Important Message Given:  Yes - Medicare IM     Arjuna Doeden W, CMA 09/03/2023, 10:28 AM

## 2023-09-03 NOTE — Discharge Summary (Addendum)
 Physician Discharge Summary   Patient: Kari Hughes MRN: [id number]  DOB: 04/22/1951   Admit:     Date of Admission: 08/31/2023 Admitted from: home   Discharge: Date of discharge: 09/03/23 Disposition: Skilled nursing facility Condition at discharge: good  CODE STATUS: FULL CODE     Discharge Physician: Sunnie Nielsen, DO [facility]     PCP: Enid Baas, MD  Recommendations for Outpatient Follow-up:  Follow up with PCP Enid Baas, MD in 1-2 weeks PT/OT as tolerated at [facility] rehab   Discharge Instructions     Diet - low sodium heart healthy   Complete by: As directed    Increase activity slowly   Complete by: As directed          Discharge Diagnoses: Principal Problem:   Acute pyelonephritis Active Problems:   Acute metabolic encephalopathy   Hypertensive urgency   Obesity (BMI 30-39.9)   Hyperlipidemia   Anxiety   MDD (major depressive disorder), recurrent episode, moderate (HCC)   Fibromyalgia   Chronic pain syndrome   GAD (generalized anxiety disorder)   Nausea and vomiting   Diaphoresis   Shortness of breath   Pyelonephritis          Hospital course / significant events:   HPI: Kari Hughes is a 73 year old female with history of hypertension, neuropathy, depression, anxiety, hyperlipidemia, who presents emergency department from home for chief concerns of altered mental status, urinary incontinence in her bed.  02/17: UA was positive for trace leukocytes and nitrates. ED treatment: Ceftriaxone 2 g IV one-time dose, LR infusion at 150 mL/h, losartan 50 mg p.o. one-time dose. Admitted to hospitalist service.  2/18.  Patient having pain in her right back, suspect pyelonephritis.  Also had some slurred speech and confusion coming in - initial CT scan of the head was negative.  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 02/19: CT head nonacute. CT renal  nonacute UCx (+)Klebsiella, Susceptibilities pending. SNF rehab placement pending.  02/20: klebsiella no unusual resistance patterns, ok to dc on po abx to rehab      Consultants:  none  Procedures/Surgeries: none      ASSESSMENT & PLAN:   Acute pyelonephritis Follow-up urine culture --> (+)Klebsiella IV Rocephin --> po keflex on discharge    Acute metabolic encephalopathy - resolved  Suspect secondary to acute pyelonephritis  Continue aspirin and Lipitor.   Hypertensive urgency Blood pressure better controlled now continue Norvasc and Cozaar   Hyperlipidemia Continue Lipitor   GAD (generalized anxiety disorder) Ativan 0.5 mg p.o. every 6 hours as needed for anxiety, 2 doses ordered on admission   Chronic pain syndrome Fibromyalgia Restart gabapentin lower dose Hydrocodone prn    Anxiety As needed Ativan, restart Prozac   Chest pain Ruled out ACS Monitor                Discharge Instructions  Allergies as of 09/03/2023   No Known Allergies      Medication List     STOP taking these medications    gabapentin 600 MG tablet Commonly known as: NEURONTIN Replaced by: gabapentin 300 MG capsule       TAKE these medications    acetaminophen 500 MG tablet Commonly known as: TYLENOL Take 1,000 mg by mouth every 6 (six) hours as needed. What changed: Another medication with the same name was added. Make sure you understand how and when to take each.  acetaminophen 325 MG tablet Commonly known as: TYLENOL Take 2 tablets (650 mg total) by mouth every 6 (six) hours as needed for mild pain (pain score 1-3), fever or headache (or Fever >/= 101). What changed: You were already taking a medication with the same name, and this prescription was added. Make sure you understand how and when to take each.   albuterol 108 (90 Base) MCG/ACT inhaler Commonly known as: VENTOLIN HFA Inhale 2 puffs into the lungs every 6 (six) hours as needed for  shortness of breath or wheezing.   amLODipine 10 MG tablet Commonly known as: NORVASC Take 1 tablet by mouth daily.   aspirin EC 81 MG tablet Take 1 tablet (81 mg total) by mouth daily. Swallow whole.   atorvastatin 40 MG tablet Commonly known as: LIPITOR Take 1 tablet (40 mg total) by mouth daily.   baclofen 10 MG tablet Commonly known as: LIORESAL Take 10 mg by mouth 2 (two) times daily as needed.   cephALEXin 500 MG capsule Commonly known as: KEFLEX Take 1 capsule (500 mg total) by mouth 4 (four) times daily for 5 days.   FLUoxetine 20 MG capsule Commonly known as: PROZAC Take 20 mg by mouth every morning.   gabapentin 300 MG capsule Commonly known as: NEURONTIN Take 1 capsule (300 mg total) by mouth 3 (three) times daily. Replaces: gabapentin 600 MG tablet   HYDROcodone-acetaminophen 5-325 MG tablet Commonly known as: NORCO/VICODIN Take 1 tablet by mouth every 6 (six) hours as needed for moderate pain (pain score 4-6) or severe pain (pain score 7-10). What changed: reasons to take this   lidocaine 5 % Commonly known as: Lidoderm Place 1 patch onto the skin every 12 (twelve) hours. Remove & Discard patch within 12 hours or as directed by MD   loratadine 10 MG tablet Commonly known as: CLARITIN Take 10 mg by mouth daily.   losartan 50 MG tablet Commonly known as: COZAAR Take 1 tablet by mouth daily.   meclizine 25 MG tablet Commonly known as: ANTIVERT Take 12.5-25 mg by mouth 3 (three) times daily as needed for dizziness.   mirtazapine 7.5 MG tablet Commonly known as: REMERON Take 7.5 mg by mouth at bedtime as needed.   vitamin D3 50 MCG (2000 UT) Caps Take 1 capsule (2,000 Units total) by mouth daily.         Contact information for after-discharge care     Destination     [facility] Preferred SNF .   Service: Skilled Nursing Contact information: [address] [place]  [address] [phone]                     No Known Allergies   Subjective: pt reports lower back pain is about the same, no other concerns today, no CP/SOB, tired but more energy compared to yesterday, no fever/chills, no abd pain, tolerating diet    Discharge Exam: BP 132/69 (BP Location: Left Wrist)   Pulse 63   Temp 98.5 F (36.9 C)   Resp 18   Ht 5\' 1"  (1.549 m)   Wt 75 kg   SpO2 97%   BMI 31.24 kg/m  General: Pt is alert, awake, not in acute distress Cardiovascular: RRR, S1/S2 +, no rubs, no gallops Respiratory: CTA bilaterally, no wheezing, no rhonchi Abdominal: Soft, NT, ND, bowel sounds + Extremities: no edema, no cyanosis     The results of significant diagnostics from this hospitalization (including imaging, microbiology,  ancillary and laboratory) are listed below for reference.     Microbiology: Recent Results (from the past 240 hours)  Resp panel by RT-PCR (RSV, Flu A&B, Covid) Anterior Nasal Swab     Status: None   Collection Time: 08/31/23  4:22 PM   Specimen: Anterior Nasal Swab  Result Value Ref Range Status   SARS Coronavirus 2 by RT PCR NEGATIVE NEGATIVE Final    Comment: (NOTE) SARS-CoV-2 target nucleic acids are NOT DETECTED.  The SARS-CoV-2 RNA is generally detectable in upper respiratory specimens during the acute phase of infection. The lowest concentration of SARS-CoV-2 viral copies this assay can detect is 138 copies/mL. A negative result does not preclude SARS-Cov-2 infection and should not be used as the sole basis for treatment or other patient management decisions. A negative result may occur with  improper specimen collection/handling, submission of specimen other than nasopharyngeal swab, presence of viral mutation(s) within the areas targeted by this assay, and inadequate number of viral copies(<138 copies/mL). A negative result must be combined with clinical observations, patient history, and epidemiological information.  The expected result is Negative.  Fact Sheet for Patients:  BloggerCourse.com  Fact Sheet for Healthcare Providers:  SeriousBroker.it  This test is no t yet approved or cleared by the [place] FDA and  has been authorized for detection and/or diagnosis of SARS-CoV-2 by FDA under an Emergency Use Authorization (EUA). This EUA will remain  in effect (meaning this test can be used) for the duration of the COVID-19 declaration under Section 564(b)(1) of the Act, 21 U.S.C.section 360bbb-3(b)(1), unless the authorization is terminated  or revoked sooner.       Influenza A by PCR NEGATIVE NEGATIVE Final   Influenza B by PCR NEGATIVE NEGATIVE Final    Comment: (NOTE) The Xpert Xpress SARS-CoV-2/FLU/RSV plus assay is intended as an aid in the diagnosis of influenza from Nasopharyngeal swab specimens and should not be used as a sole basis for treatment. Nasal washings and aspirates are unacceptable for Xpert Xpress SARS-CoV-2/FLU/RSV testing.  Fact Sheet for Patients: BloggerCourse.com  Fact Sheet for Healthcare Providers: SeriousBroker.it  This test is not yet approved or cleared by the [place] FDA and has been authorized for detection and/or diagnosis of SARS-CoV-2 by FDA under an Emergency Use Authorization (EUA). This EUA will remain in effect (meaning this test can be used) for the duration of the COVID-19 declaration under Section 564(b)(1) of the Act, 21 U.S.C. section 360bbb-3(b)(1), unless the authorization is terminated or revoked.     Resp Syncytial Virus by PCR NEGATIVE NEGATIVE Final    Comment: (NOTE) Fact Sheet for Patients: BloggerCourse.com  Fact Sheet for Healthcare Providers: SeriousBroker.it  This test is not yet approved or cleared by the [place] FDA and has been authorized for detection and/or  diagnosis of SARS-CoV-2 by FDA under an Emergency Use Authorization (EUA). This EUA will remain in effect (meaning this test can be used) for the duration of the COVID-19 declaration under Section 564(b)(1) of the Act, 21 U.S.C. section 360bbb-3(b)(1), unless the authorization is terminated or revoked.  Performed at [facility], [address], [place], [place] [address]   Urine Culture     Status: Abnormal   Collection Time: 08/31/23  4:22 PM   Specimen: Urine, Catheterized  Result Value Ref Range Status   Specimen Description   Final    URINE, CATHETERIZED Performed at [facility], [address] [place], [place] [address]    Special Requests   Final  NONE Performed at [facility], [address] [place], [place] [address]    Culture >=100,000 COLONIES/mL KLEBSIELLA PNEUMONIAE (A)  Final   Report Status 09/03/2023 FINAL  Final   Organism ID, Bacteria KLEBSIELLA PNEUMONIAE (A)  Final      Susceptibility   Klebsiella pneumoniae - MIC*    AMPICILLIN RESISTANT Resistant     CEFAZOLIN <=4 SENSITIVE Sensitive     CEFEPIME <=0.12 SENSITIVE Sensitive     CEFTRIAXONE <=0.25 SENSITIVE Sensitive     CIPROFLOXACIN <=0.25 SENSITIVE Sensitive     GENTAMICIN <=1 SENSITIVE Sensitive     IMIPENEM <=0.25 SENSITIVE Sensitive     NITROFURANTOIN <=16 SENSITIVE Sensitive     TRIMETH/SULFA <=20 SENSITIVE Sensitive     AMPICILLIN/SULBACTAM 8 SENSITIVE Sensitive     PIP/TAZO <=4 SENSITIVE Sensitive ug/mL    * >=100,000 COLONIES/mL KLEBSIELLA PNEUMONIAE  Blood culture (routine x 2)     Status: None (Preliminary result)   Collection Time: 08/31/23  5:13 PM   Specimen: BLOOD  Result Value Ref Range Status   Specimen Description BLOOD BLOOD LEFT ARM  Final   Special Requests   Final    Blood Culture results may not be optimal due to an inadequate volume of blood received in culture bottles   Culture   Final    NO GROWTH 3 DAYS Performed at [facility], [address] [place], [place] [address]    Report Status PENDING  Incomplete  Blood culture (routine x 2)     Status: None (Preliminary result)   Collection Time: 08/31/23  5:13 PM   Specimen: Left Antecubital; Blood  Result Value Ref Range Status   Specimen Description LEFT ANTECUBITAL  Final   Special Requests NONE  Final   Culture   Final    NO GROWTH 3 DAYS Performed at [facility], [address] [place], [place] [address]    Report Status PENDING  Incomplete     Labs: BNP (last 3 results) No results for input(s): "BNP" in the last 8760 hours. Basic Metabolic Panel: Recent Labs  Lab 08/31/23 1137 09/01/23 0732  NA 141 140  K 3.9 4.1  CL 106 109  CO2 22 22  GLUCOSE 102* 98  BUN 15 12  CREATININE 0.92 0.96  CALCIUM 9.6 8.7*   Liver Function Tests: Recent Labs  Lab 08/31/23 1137  AST 13*  ALT 12  ALKPHOS 71  BILITOT 0.6  PROT 7.8  ALBUMIN 4.2   No results for input(s): "LIPASE", "AMYLASE" in the last 168 hours. No results for input(s): "AMMONIA" in the last 168 hours. CBC: Recent Labs  Lab 08/31/23 1137 09/01/23 0732  WBC 11.7* 8.8  NEUTROABS 8.7*  --   HGB 14.7 12.1  HCT 45.7 37.6  MCV 85.6 85.1  PLT 311 251   Cardiac Enzymes: No results for input(s): "CKTOTAL", "CKMB", "CKMBINDEX", "TROPONINI" in the last 168 hours. BNP: Invalid input(s): "POCBNP" CBG: Recent Labs  Lab 08/31/23 1136  GLUCAP 96   D-Dimer No results for input(s): "DDIMER" in the last 72 hours. Hgb A1c No results for input(s): "HGBA1C" in the last 72 hours. Lipid Profile No results for input(s): "CHOL", "HDL", "LDLCALC", "TRIG", "CHOLHDL", "LDLDIRECT" in the last 72 hours. Thyroid function studies No results for input(s): "TSH", "T4TOTAL", "T3FREE", "THYROIDAB" in the last 72 hours.  Invalid input(s): "FREET3" Anemia work up No results for input(s): "VITAMINB12", "FOLATE", "FERRITIN", "TIBC", "IRON", "RETICCTPCT" in the last 72 hours. Urinalysis  Component Value Date/Time   COLORURINE YELLOW (A) 08/31/2023 1622   APPEARANCEUR HAZY (A) 08/31/2023 1622   LABSPEC 1.020 08/31/2023 1622   PHURINE 5.0 08/31/2023 1622   GLUCOSEU NEGATIVE 08/31/2023 1622   HGBUR MODERATE (A) 08/31/2023 1622   BILIRUBINUR NEGATIVE 08/31/2023 1622   KETONESUR NEGATIVE 08/31/2023 1622   PROTEINUR 100 (A) 08/31/2023 1622   NITRITE POSITIVE (A) 08/31/2023 1622   LEUKOCYTESUR TRACE (A) 08/31/2023 1622   Sepsis Labs Recent Labs  Lab 08/31/23 1137 09/01/23 0732  WBC 11.7* 8.8   Microbiology Recent Results (from the past 240 hours)  Resp panel by RT-PCR (RSV, Flu A&B, Covid) Anterior Nasal Swab     Status: None   Collection Time: 08/31/23  4:22 PM   Specimen: Anterior Nasal Swab  Result Value Ref Range Status   SARS Coronavirus 2 by RT PCR NEGATIVE NEGATIVE Final    Comment: (NOTE) SARS-CoV-2 target nucleic acids are NOT DETECTED.  The SARS-CoV-2 RNA is generally detectable in upper respiratory specimens during the acute phase of infection. The lowest concentration of SARS-CoV-2 viral copies this assay can detect is 138 copies/mL. A negative result does not preclude SARS-Cov-2 infection and should not be used as the sole basis for treatment or other patient management decisions. A negative result may occur with  improper specimen collection/handling, submission of specimen other than nasopharyngeal swab, presence of viral mutation(s) within the areas targeted by this assay, and inadequate number of viral copies(<138 copies/mL). A negative result must be combined with clinical observations, patient history, and epidemiological information. The expected result is Negative.  Fact Sheet for Patients:  BloggerCourse.com  Fact Sheet for Healthcare Providers:  SeriousBroker.it  This test is no t yet approved or cleared by the [place] FDA and  has been authorized for detection and/or diagnosis  of SARS-CoV-2 by FDA under an Emergency Use Authorization (EUA). This EUA will remain  in effect (meaning this test can be used) for the duration of the COVID-19 declaration under Section 564(b)(1) of the Act, 21 U.S.C.section 360bbb-3(b)(1), unless the authorization is terminated  or revoked sooner.       Influenza A by PCR NEGATIVE NEGATIVE Final   Influenza B by PCR NEGATIVE NEGATIVE Final    Comment: (NOTE) The Xpert Xpress SARS-CoV-2/FLU/RSV plus assay is intended as an aid in the diagnosis of influenza from Nasopharyngeal swab specimens and should not be used as a sole basis for treatment. Nasal washings and aspirates are unacceptable for Xpert Xpress SARS-CoV-2/FLU/RSV testing.  Fact Sheet for Patients: BloggerCourse.com  Fact Sheet for Healthcare Providers: SeriousBroker.it  This test is not yet approved or cleared by the [place] FDA and has been authorized for detection and/or diagnosis of SARS-CoV-2 by FDA under an Emergency Use Authorization (EUA). This EUA will remain in effect (meaning this test can be used) for the duration of the COVID-19 declaration under Section 564(b)(1) of the Act, 21 U.S.C. section 360bbb-3(b)(1), unless the authorization is terminated or revoked.     Resp Syncytial Virus by PCR NEGATIVE NEGATIVE Final    Comment: (NOTE) Fact Sheet for Patients: BloggerCourse.com  Fact Sheet for Healthcare Providers: SeriousBroker.it  This test is not yet approved or cleared by the [place] FDA and has been authorized for detection and/or diagnosis of SARS-CoV-2 by FDA under an Emergency Use Authorization (EUA). This EUA will remain in effect (meaning this test can be used) for the duration of the COVID-19 declaration under Section 564(b)(1) of the Act, 21 U.S.C. section 360bbb-3(b)(1), unless  the authorization is terminated  or revoked.  Performed at [facility], [address] [place], [place] [address]   Urine Culture     Status: Abnormal   Collection Time: 08/31/23  4:22 PM   Specimen: Urine, Catheterized  Result Value Ref Range Status   Specimen Description   Final    URINE, CATHETERIZED Performed at [facility], [address] [place], [place] [address]    Special Requests   Final    NONE Performed at [facility], [address] [place], [place] [address]    Culture >=100,000 COLONIES/mL KLEBSIELLA PNEUMONIAE (A)  Final   Report Status 09/03/2023 FINAL  Final   Organism ID, Bacteria KLEBSIELLA PNEUMONIAE (A)  Final      Susceptibility   Klebsiella pneumoniae - MIC*    AMPICILLIN RESISTANT Resistant     CEFAZOLIN <=4 SENSITIVE Sensitive     CEFEPIME <=0.12 SENSITIVE Sensitive     CEFTRIAXONE <=0.25 SENSITIVE Sensitive     CIPROFLOXACIN <=0.25 SENSITIVE Sensitive     GENTAMICIN <=1 SENSITIVE Sensitive     IMIPENEM <=0.25 SENSITIVE Sensitive     NITROFURANTOIN <=16 SENSITIVE Sensitive     TRIMETH/SULFA <=20 SENSITIVE Sensitive     AMPICILLIN/SULBACTAM 8 SENSITIVE Sensitive     PIP/TAZO <=4 SENSITIVE Sensitive ug/mL    * >=100,000 COLONIES/mL KLEBSIELLA PNEUMONIAE  Blood culture (routine x 2)     Status: None (Preliminary result)   Collection Time: 08/31/23  5:13 PM   Specimen: BLOOD  Result Value Ref Range Status   Specimen Description BLOOD BLOOD LEFT ARM  Final   Special Requests   Final    Blood Culture results may not be optimal due to an inadequate volume of blood received in culture bottles   Culture   Final    NO GROWTH 3 DAYS Performed at [facility], [address] [place], [place] [address]    Report Status PENDING  Incomplete  Blood culture (routine x 2)     Status: None (Preliminary result)   Collection Time: 08/31/23  5:13 PM   Specimen: Left Antecubital; Blood  Result Value Ref Range Status   Specimen Description LEFT  ANTECUBITAL  Final   Special Requests NONE  Final   Culture   Final    NO GROWTH 3 DAYS Performed at [facility], [address], [place], [place] [address]    Report Status PENDING  Incomplete   Imaging DG Chest Port 1 View Result Date: 08/31/2023 CLINICAL DATA:  Altered mental status. EXAM: PORTABLE CHEST 1 VIEW COMPARISON:  04/09/2023 FINDINGS: Left-sided pacemaker remains in place. Upper normal heart size with stable mediastinal contours. Chronic elevation of right hemidiaphragm. Mild left lung base atelectasis. No confluent consolidation. No pulmonary edema or pleural effusion. No pneumothorax. IMPRESSION: Mild left lung base atelectasis. Chronic elevation of right hemidiaphragm. Electronically Signed   By: Narda Rutherford M.D.   On: 08/31/2023 19:24   CT HEAD WO CONTRAST Result Date: 08/31/2023 CLINICAL DATA:  Mental status change, unknown cause. Headache, neuro deficit. Dysarthria, altered mental status, and dizziness. History of stroke. EXAM: CT HEAD WITHOUT CONTRAST TECHNIQUE: Contiguous axial images were obtained from the base of the skull through the vertex without intravenous contrast. RADIATION DOSE REDUCTION: This exam was performed according to the departmental dose-optimization program which includes automated exposure control, adjustment of the mA and/or kV according to patient size and/or use of iterative reconstruction technique. COMPARISON:  CTA head and neck 04/10/2023 FINDINGS: Brain: There  is no evidence of an acute infarct, intracranial hemorrhage, mass, midline shift, or extra-axial fluid collection. Patchy hypodensities in the cerebral white matter bilaterally are stable to mildly progressed and nonspecific but compatible with moderate to severe chronic small vessel ischemic disease. There are chronic lacunar infarcts in the basal ganglia regions bilaterally which have mildly increased in number. Moderate, central predominant cerebral atrophy is similar to the prior  study. Vascular: Calcified atherosclerosis at the skull base. No hyperdense vessel. Skull: No acute fracture or suspicious osseous lesion. Sinuses/Orbits: Visualized paranasal sinuses and mastoid air cells are clear. Unremarkable orbits. Other: None. IMPRESSION: 1. No evidence of acute intracranial abnormality. 2. Moderate to severe chronic small vessel ischemic disease and moderate cerebral atrophy. Electronically Signed   By: Sebastian Ache M.D.   On: 08/31/2023 14:51      Time coordinating discharge: over 30 minutes  SIGNED:  Sunnie Nielsen DO [facility]

## 2023-09-05 LAB — CULTURE, BLOOD (ROUTINE X 2)
Culture: NO GROWTH
Culture: NO GROWTH

## 2023-09-23 NOTE — Progress Notes (Unsigned)
 Referring Physician:  Enid Baas, MD [address] [place],  [place] [address]  Primary Physician:  Enid Baas, MD  History of Present Illness: 09/23/2023*** [name] has a history of CVA, HTN, hyperlipidemia, FM, chronic pain, bipolar, peripheral neuropathy.   She has a pacemaker. She is WC bound.   Last seen by Dr. Myer Haff 04/04/21- he did not recommend surgery and referred her back to Dr. Cherylann Ratel to discuss SCS.   Low back pain   She is taking tylenol, baclofen, neurontin, norco, and lidoderm.   Duration: *** Location: *** Quality: *** Severity: ***  Precipitating: aggravated by *** Modifying factors: made better by *** Weakness: none Timing: ***  She smokes ***  Bowel/Bladder Dysfunction: none  Conservative measures:  Physical therapy: *** has not participated in? Multimodal medical therapy including regular antiinflammatories: ***  Injections: *** no epidural steroid injections?  Past Surgery: ***no spinal surgeries  Kari Hughes has ***no symptoms of cervical myelopathy.  The symptoms are causing a significant impact on the patient's life.   Review of Systems:  A 10 point review of systems is negative, except for the pertinent positives and negatives detailed in the HPI.  Past Medical History: Past Medical History:  Diagnosis Date   Asthma    Depression    Hypertension    Hypertension 2002   Spinal stenosis 2012   Vertigo     Past Surgical History: Past Surgical History:  Procedure Laterality Date   JOINT REPLACEMENT Left 2013   PACEMAKER PLACEMENT  2019    Allergies: Allergies as of 09/24/2023   (No Known Allergies)    Medications: Outpatient Encounter Medications as of 09/24/2023  Medication Sig   acetaminophen (TYLENOL) 325 MG tablet Take 2 tablets (650 mg total) by mouth every 6 (six) hours as needed for mild pain (pain score 1-3), fever or headache (or Fever >/= 101).   acetaminophen (TYLENOL) 500  MG tablet Take 1,000 mg by mouth every 6 (six) hours as needed.   albuterol (VENTOLIN HFA) 108 (90 Base) MCG/ACT inhaler Inhale 2 puffs into the lungs every 6 (six) hours as needed for shortness of breath or wheezing.   amLODipine (NORVASC) 10 MG tablet Take 1 tablet by mouth daily.   aspirin EC 81 MG tablet Take 1 tablet (81 mg total) by mouth daily. Swallow whole.   atorvastatin (LIPITOR) 40 MG tablet Take 1 tablet (40 mg total) by mouth daily.   baclofen (LIORESAL) 10 MG tablet Take 10 mg by mouth 2 (two) times daily as needed.   Cholecalciferol (VITAMIN D3) 50 MCG (2000 UT) CAPS Take 1 capsule (2,000 Units total) by mouth daily.   FLUoxetine (PROZAC) 20 MG capsule Take 20 mg by mouth every morning.   gabapentin (NEURONTIN) 300 MG capsule Take 1 capsule (300 mg total) by mouth 3 (three) times daily.   HYDROcodone-acetaminophen (NORCO/VICODIN) 5-325 MG tablet Take 1 tablet by mouth every 6 (six) hours as needed for moderate pain (pain score 4-6) or severe pain (pain score 7-10).   lidocaine (LIDODERM) 5 % Place 1 patch onto the skin every 12 (twelve) hours. Remove & Discard patch within 12 hours or as directed by MD   loratadine (CLARITIN) 10 MG tablet Take 10 mg by mouth daily.   losartan (COZAAR) 50 MG tablet Take 1 tablet by mouth daily.   meclizine (ANTIVERT) 25 MG tablet Take 12.5-25 mg by mouth 3 (three) times daily as needed for dizziness.   mirtazapine (REMERON) 7.5 MG tablet Take 7.5  mg by mouth at bedtime as needed.   No facility-administered encounter medications on file as of 09/24/2023.    Social History: Social History   Tobacco Use   Smoking status: Every Day    Types: Cigarettes, E-cigarettes   Smokeless tobacco: Never   Tobacco comments:    5 cigarettes per day  Vaping Use   Vaping status: Former  Substance Use Topics   Alcohol use: Yes    Alcohol/week: 1.0 standard drink of alcohol    Types: 1 Glasses of wine per week    Comment: occasional   Drug use: Yes     Types: Marijuana    Family Medical History: Family History  Problem Relation Age of Onset   Drug abuse Brother    Alcohol abuse Brother    Drug abuse Brother    Alcohol abuse Brother     Physical Examination: There were no vitals filed for this visit.  General: Patient is well developed, well nourished, calm, collected, and in no apparent distress. Attention to examination is appropriate.  Respiratory: Patient is breathing without any difficulty.   NEUROLOGICAL:     Awake, alert, oriented to person, place, and time.  Speech is clear and fluent. Fund of knowledge is appropriate.   Cranial Nerves: Pupils equal round and reactive to light.  Facial tone is symmetric.    *** ROM of cervical spine *** pain *** posterior cervical tenderness. *** tenderness in bilateral trapezial region.   *** ROM of lumbar spine *** pain *** posterior lumbar tenderness.   No abnormal lesions on exposed skin.   Strength: Side Biceps Triceps Deltoid Interossei Grip Wrist Ext. Wrist Flex.  R 5 5 5 5 5 5 5   L 5 5 5 5 5 5 5    Side Iliopsoas Quads Hamstring PF DF EHL  R 5 5 5 5 5 5   L 5 5 5 5 5 5    Reflexes are ***2+ and symmetric at the biceps, brachioradialis, patella and achilles.   Hoffman's is absent.  Clonus is not present.   Bilateral upper and lower extremity sensation is intact to light touch.     Gait is normal.   ***No difficulty with tandem gait.    Medical Decision Making  Imaging: Lumbar MRI dated 03/13/21:  MRI LUMBAR SPINE FINDINGS   Segmentation:  5 lumbar type vertebrae   Alignment:  Degenerative grade 1 anterolisthesis at L5-S1.   Vertebrae:  Benign heterogeneity of marrow which is generalized.   Conus medullaris and cauda equina: Conus extends to the L2 level. Conus and cauda equina appear normal. No evidence of tethering mass.   Paraspinal and other soft tissues: No perispinal mass or inflammation noted. Comparatively atrophic right kidney.  Colonic diverticulosis   Disc levels:   T12- L1: Unremarkable.   L1-L2: Noncompressive disc bulging   L2-L3: Disc narrowing and bulging.  Negative facets.   L3-L4: Disc narrowing and bulging with left paracentral to foraminal protrusion. There is up lifting and mass effect on the left L3 nerve root at the foramen.   L4-L5: Disc narrowing and endplate degeneration with circumferential bulging greatest at the foramina. Left foraminal impingement with L4 root flattening on sagittal images. Mild right foraminal narrowing   L5-S1:Disc narrowing and bulging. Advanced facet osteoarthritis with spurring and anterolisthesis. Biforaminal L5 impingement. Patent spinal canal.   IMPRESSION: 1. 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 2. Foraminal impingement on the left at L3-4 to L5-S1 and on the right at L5-S1. 3. Diffusely patent  spinal canal.     Electronically Signed   By: Marnee Spring M.D.   On: 03/15/2021 10:57  I have personally reviewed the images and agree with the above interpretation.  Assessment and Plan: Ms. Mcphearson is a pleasant 73 y.o. female has ***  Treatment options discussed with patient and following plan made:   - Order for physical therapy for *** spine ***. Patient to call to schedule appointment. *** - Continue current medications including ***. Reviewed dosing and side effects.  - Prescription for ***. Reviewed dosing and side effects. Take with food.  - Prescription for *** to take prn muscle spasms. Reviewed dosing and side effects. Discussed this can cause drowsiness.  - MRI of *** to further evaluate *** radiculopathy. No improvement time or medications (***).  - Referral to PMR at [facility] to discuss possible *** injections.  - Will schedule phone visit to review MRI results once I get them back.   I spent a total of *** minutes in face-to-face and non-face-to-face activities related to this patient's care today including review  of outside records, review of imaging, review of symptoms, physical exam, discussion of differential diagnosis, discussion of treatment options, and documentation.   Thank you for involving me in the care of this patient.   Drake Leach PA-C Dept. of Neurosurgery

## 2023-09-24 ENCOUNTER — Encounter: Admitting: Orthopedic Surgery

## 2023-10-12 ENCOUNTER — Encounter: Payer: Self-pay | Admitting: Orthopedic Surgery

## 2023-11-02 ENCOUNTER — Emergency Department
Admission: EM | Admit: 2023-11-02 | Discharge: 2023-11-02 | Disposition: A | Discharge status: Home / Self Care | Attending: Emergency Medicine | Admitting: Emergency Medicine

## 2023-11-02 ENCOUNTER — Other Ambulatory Visit: Payer: Self-pay

## 2023-11-02 ENCOUNTER — Emergency Department

## 2023-11-02 DIAGNOSIS — I10 Essential (primary) hypertension: Secondary | ICD-10-CM | POA: Diagnosis not present

## 2023-11-02 DIAGNOSIS — I6789 Other cerebrovascular disease: Secondary | ICD-10-CM | POA: Diagnosis not present

## 2023-11-02 DIAGNOSIS — J45909 Unspecified asthma, uncomplicated: Secondary | ICD-10-CM | POA: Insufficient documentation

## 2023-11-02 DIAGNOSIS — N390 Urinary tract infection, site not specified: Secondary | ICD-10-CM | POA: Diagnosis not present

## 2023-11-02 DIAGNOSIS — R300 Dysuria: Secondary | ICD-10-CM | POA: Diagnosis present

## 2023-11-02 DIAGNOSIS — R9431 Abnormal electrocardiogram [ECG] [EKG]: Secondary | ICD-10-CM | POA: Insufficient documentation

## 2023-11-02 DIAGNOSIS — R4182 Altered mental status, unspecified: Secondary | ICD-10-CM | POA: Insufficient documentation

## 2023-11-02 DIAGNOSIS — I6782 Cerebral ischemia: Secondary | ICD-10-CM | POA: Insufficient documentation

## 2023-11-02 LAB — DIFFERENTIAL
Abs Immature Granulocytes: 0.02 10*3/uL (ref 0.00–0.07)
Basophils Absolute: 0 10*3/uL (ref 0.0–0.1)
Basophils Relative: 0 %
Eosinophils Absolute: 0.1 10*3/uL (ref 0.0–0.5)
Eosinophils Relative: 1 %
Immature Granulocytes: 0 %
Lymphocytes Relative: 24 %
Lymphs Abs: 1.8 10*3/uL (ref 0.7–4.0)
Monocytes Absolute: 0.7 10*3/uL (ref 0.1–1.0)
Monocytes Relative: 9 %
Neutro Abs: 5 10*3/uL (ref 1.7–7.7)
Neutrophils Relative %: 66 %

## 2023-11-02 LAB — URINALYSIS, COMPLETE (UACMP) WITH MICROSCOPIC
Bilirubin Urine: NEGATIVE
Glucose, UA: NEGATIVE mg/dL
Hgb urine dipstick: NEGATIVE
Ketones, ur: NEGATIVE mg/dL
Leukocytes,Ua: NEGATIVE
Nitrite: NEGATIVE
Protein, ur: 30 mg/dL — AB
Specific Gravity, Urine: 1.017 (ref 1.005–1.030)
pH: 5 (ref 5.0–8.0)

## 2023-11-02 LAB — COMPREHENSIVE METABOLIC PANEL WITH GFR
ALT: 12 U/L (ref 0–44)
AST: 15 U/L (ref 15–41)
Albumin: 4.3 g/dL (ref 3.5–5.0)
Alkaline Phosphatase: 64 U/L (ref 38–126)
Anion gap: 9 (ref 5–15)
BUN: 11 mg/dL (ref 8–23)
CO2: 23 mmol/L (ref 22–32)
Calcium: 9.7 mg/dL (ref 8.9–10.3)
Chloride: 107 mmol/L (ref 98–111)
Creatinine, Ser: 1 mg/dL (ref 0.44–1.00)
GFR, Estimated: 60 mL/min — ABNORMAL LOW (ref >60)
Glucose, Bld: 115 mg/dL — ABNORMAL HIGH (ref 70–99)
Potassium: 3.9 mmol/L (ref 3.5–5.1)
Sodium: 139 mmol/L (ref 135–145)
Total Bilirubin: 0.6 mg/dL (ref 0.0–1.2)
Total Protein: 7.9 g/dL (ref 6.5–8.1)

## 2023-11-02 LAB — PROTIME-INR
INR: 1 (ref 0.8–1.2)
Prothrombin Time: 13 s (ref 11.4–15.2)

## 2023-11-02 LAB — CBC
HCT: 43.4 % (ref 36.0–46.0)
Hemoglobin: 14.2 g/dL (ref 12.0–15.0)
MCH: 27.5 pg (ref 26.0–34.0)
MCHC: 32.7 g/dL (ref 30.0–36.0)
MCV: 84.1 fL (ref 80.0–100.0)
Platelets: 354 10*3/uL (ref 150–400)
RBC: 5.16 MIL/uL — ABNORMAL HIGH (ref 3.87–5.11)
RDW: 15.9 % — ABNORMAL HIGH (ref 11.5–15.5)
WBC: 7.6 10*3/uL (ref 4.0–10.5)
nRBC: 0 % (ref 0.0–0.2)

## 2023-11-02 LAB — ETHANOL: Alcohol, Ethyl (B): <10 mg/dL (ref <10)

## 2023-11-02 LAB — APTT: aPTT: 31 s (ref 24–36)

## 2023-11-02 MED ORDER — CEPHALEXIN 500 MG PO CAPS: 500.0000 mg | ORAL_CAPSULE | Freq: Two times a day (BID) | ORAL | 0 refills | Status: AC

## 2023-11-02 MED ORDER — SODIUM CHLORIDE 0.9 % IV SOLN
1.0000 g | Freq: Once | INTRAVENOUS | Status: AC
  Administered 2023-11-02: 1 g via INTRAVENOUS
  Filled 2023-11-02: qty 10

## 2023-11-02 MED ORDER — SODIUM CHLORIDE 0.9% FLUSH: 3.0000 mL | Freq: Once | INTRAVENOUS | Status: DC

## 2023-11-02 MED ORDER — FENTANYL CITRATE PF 50 MCG/ML IJ SOSY
50.0000 ug | PREFILLED_SYRINGE | Freq: Once | INTRAMUSCULAR | Status: AC
  Administered 2023-11-02: 50 ug via INTRAVENOUS
  Filled 2023-11-02: qty 1

## 2023-11-02 NOTE — ED Triage Notes (Addendum)
 Pt comes with c/o AMS. Pt brought over from [place]> pt had UTI last month and hospitalized for it. Pt BP at home was elevated. Pt was given meds at 0830.   Pt has been having some confusion per nurse aid. Pt told nurse aid she is not the same and its happening again. Pt not at baseline. Pt states some dizziness but hx of vertigo. Pt did have headache earlier but not now.  Pt is A&OX4.

## 2023-11-02 NOTE — ED Triage Notes (Signed)
 Patient's NA brought patient to [facility] for concerns of HTN.  SP: 129/82 at [facility]. Pt c/o headache and blurry vision.  Also states unable to sleep yesterday and confusion since yesterday at 0800.  Also c/o low back pain.  UTI in FEbruary.

## 2023-11-02 NOTE — ED Notes (Signed)
 Pt's sister arrived to transport pt home. Pt CAOx4 and breathing normally upon discharge. Pt denies any pain.

## 2023-11-02 NOTE — Discharge Instructions (Addendum)
 Please take antibiotics for their entire prescribed course.  Please follow-up with your primary care doctor within the next 2 to 3 days for recheck/reevaluation.  Return to the emergency department for any worsening symptoms, fever, or any other symptom personally concerning to yourself.

## 2023-11-02 NOTE — ED Notes (Signed)
 Pt's walker found in triage area and given to her in room 53.

## 2023-11-02 NOTE — ED Provider Notes (Signed)
 [facility] Provider Note    Event Date/Time   First MD Initiated Contact with Patient 11/02/23 1806     (approximate)  History   Chief Complaint: Altered Mental Status  HPI  Kari Hughes is a 73 y.o. female with a past medical history of asthma, depression, hypertension, vertigo, presents to the emergency department with multiple complaints.  Initially patient was brought from [place] [facility] with concerns for high blood pressure and headache however blood pressure had normalized upon arrival here.  Patient denies any headache to myself.  Report that the patient has been somewhat confused at times of last couple days.  Patient states she felt similarly last month when she was hospitalized for urinary tract infection per patient.  Patient states she has noted some slight dysuria at times she also states she feels like her dizziness is worse.  Patient has a history of vertigo.  Denies any focal weakness or numbness no confusion or speech difficulties.  Patient is awake alert oriented no distress at this time.  Physical Exam   Triage Vital Signs: ED Triage Vitals  Encounter Vitals Group     BP 11/02/23 1108 (!) 146/86     Systolic BP Percentile --      Diastolic BP Percentile --      Pulse Rate 11/02/23 1108 81     Resp 11/02/23 1108 18     Temp 11/02/23 1108 98 F (36.7 C)     Temp Source 11/02/23 1640 Oral     SpO2 11/02/23 1108 93 %     Weight 11/02/23 1106 167 lb (75.8 kg)     Height 11/02/23 1106 5\' 2"  (1.575 m)     Head Circumference --      Peak Flow --      Pain Score 11/02/23 1106 0     Pain Loc --      Pain Education --      Exclude from Growth Chart --     Most recent vital signs: Vitals:   11/02/23 1641 11/02/23 1815  BP: (!) 144/87 126/87  Pulse: 80 73  Resp: 18 20  Temp: 98.2 F (36.8 C)   SpO2: 99% 97%    General: Awake, no distress.  CV:  Good peripheral perfusion.  Regular rate and rhythm  Resp:  Normal effort.  Equal  breath sounds bilaterally.  Abd:  No distention.  Soft, nontender.  No rebound or guarding.  ED Results / Procedures / Treatments   EKG  EKG viewed and interpreted by myself shows what appears to be an atrial paced rhythm at 80 bpm with a narrow QRS, normal axis, normal intervals, nonspecific ST changes.  RADIOLOGY  I have reviewed and interpreted CT head images.  No obvious bleed or significant abnormality seen on my evaluation.   MEDICATIONS ORDERED IN ED: Medications  sodium chloride  flush (NS) 0.9 % injection 3 mL (3 mLs Intravenous Not Given 11/02/23 1816)  cefTRIAXone  (ROCEPHIN ) 1 g in sodium chloride  0.9 % 100 mL IVPB (has no administration in time range)  fentaNYL  (SUBLIMAZE ) injection 50 mcg (has no administration in time range)     IMPRESSION / MDM / ASSESSMENT AND PLAN / ED COURSE  I reviewed the triage vital signs and the nursing notes.  Patient's presentation is most consistent with acute presentation with potential threat to life or bodily function.  Patient presents to the emergency department for concerns for dizziness, headache earlier although now gone, also states urinary symptoms with  slight confusion consistent with recent urinary tract infection.  Overall the patient appears well, no distress.  Patient's workup today shows a reassuring CBC reassuring chemistry, negative CT scan of the head reassuring EKG.  Patient's urinalysis does appear to show a urinary tract infection with many bacteria.  We will dose IV Rocephin .  Patient also states some pain when she urinates as well as in the back which she states is common with her urinary tract infections.  Will dose a small dose of fentanyl .  Given the patient's otherwise reassuring workup reassuring vital signs and reassuring physical exam I believe the patient will be able to be discharged home on oral antibiotics.  Patient agreeable.    FINAL CLINICAL IMPRESSION(S) / ED DIAGNOSES   Urinary tract infection  Note:   This document was prepared using Dragon voice recognition software and may include unintentional dictation errors.   Ruth Cove, MD 11/02/23 2022

## 2023-11-02 NOTE — ED Notes (Signed)
 This RN gave report to [employer] and performed care handoff via phone call. pt updated on plan of care.

## 2023-11-02 NOTE — ED Notes (Signed)
 This RN received report from Lena Qualia RN and performed care handoff. This RN introduced self to pt. Call light in reach, bed wheels locked, side rail raised, pt updated on plan of care. Rounding completed.

## 2023-11-03 LAB — URINE CULTURE

## 2023-12-14 ENCOUNTER — Emergency Department
Admission: EM | Admit: 2023-12-14 | Discharge: 2023-12-14 | Disposition: A | Discharge status: Home / Self Care | Attending: Emergency Medicine | Admitting: Emergency Medicine

## 2023-12-14 ENCOUNTER — Other Ambulatory Visit: Payer: Self-pay

## 2023-12-14 ENCOUNTER — Emergency Department

## 2023-12-14 DIAGNOSIS — R7989 Other specified abnormal findings of blood chemistry: Secondary | ICD-10-CM | POA: Diagnosis not present

## 2023-12-14 DIAGNOSIS — M549 Dorsalgia, unspecified: Secondary | ICD-10-CM | POA: Insufficient documentation

## 2023-12-14 DIAGNOSIS — R531 Weakness: Secondary | ICD-10-CM | POA: Insufficient documentation

## 2023-12-14 DIAGNOSIS — M79605 Pain in left leg: Secondary | ICD-10-CM | POA: Diagnosis not present

## 2023-12-14 DIAGNOSIS — I10 Essential (primary) hypertension: Secondary | ICD-10-CM | POA: Insufficient documentation

## 2023-12-14 DIAGNOSIS — M79604 Pain in right leg: Secondary | ICD-10-CM | POA: Insufficient documentation

## 2023-12-14 DIAGNOSIS — R600 Localized edema: Secondary | ICD-10-CM | POA: Insufficient documentation

## 2023-12-14 DIAGNOSIS — M7989 Other specified soft tissue disorders: Secondary | ICD-10-CM | POA: Diagnosis present

## 2023-12-14 LAB — COMPREHENSIVE METABOLIC PANEL WITH GFR
ALT: 24 U/L (ref 0–44)
AST: 23 U/L (ref 15–41)
Albumin: 4 g/dL (ref 3.5–5.0)
Alkaline Phosphatase: 62 U/L (ref 38–126)
Anion gap: 9 (ref 5–15)
BUN: 22 mg/dL (ref 8–23)
CO2: 24 mmol/L (ref 22–32)
Calcium: 9.1 mg/dL (ref 8.9–10.3)
Chloride: 111 mmol/L (ref 98–111)
Creatinine, Ser: 0.96 mg/dL (ref 0.44–1.00)
GFR, Estimated: >60 mL/min (ref >60)
Glucose, Bld: 95 mg/dL (ref 70–99)
Potassium: 4.3 mmol/L (ref 3.5–5.1)
Sodium: 144 mmol/L (ref 135–145)
Total Bilirubin: 0.3 mg/dL (ref 0.0–1.2)
Total Protein: 7.4 g/dL (ref 6.5–8.1)

## 2023-12-14 LAB — URINALYSIS, W/ REFLEX TO CULTURE (INFECTION SUSPECTED)
Bilirubin Urine: NEGATIVE
Glucose, UA: NEGATIVE mg/dL
Hgb urine dipstick: NEGATIVE
Leukocytes,Ua: NEGATIVE
Nitrite: NEGATIVE
Protein, ur: 30 mg/dL — AB
Specific Gravity, Urine: 1.02 (ref 1.005–1.030)
pH: 5.5 (ref 5.0–8.0)

## 2023-12-14 LAB — CBC
HCT: 36.9 % (ref 36.0–46.0)
Hemoglobin: 11.7 g/dL — ABNORMAL LOW (ref 12.0–15.0)
MCH: 27.3 pg (ref 26.0–34.0)
MCHC: 31.7 g/dL (ref 30.0–36.0)
MCV: 86.2 fL (ref 80.0–100.0)
Platelets: 324 10*3/uL (ref 150–400)
RBC: 4.28 MIL/uL (ref 3.87–5.11)
RDW: 17.2 % — ABNORMAL HIGH (ref 11.5–15.5)
WBC: 10 10*3/uL (ref 4.0–10.5)
nRBC: 0 % (ref 0.0–0.2)

## 2023-12-14 LAB — TROPONIN I (HIGH SENSITIVITY): Troponin I (High Sensitivity): 5 ng/L (ref <18)

## 2023-12-14 LAB — BRAIN NATRIURETIC PEPTIDE: B Natriuretic Peptide: 135 pg/mL — ABNORMAL HIGH (ref 0.0–100.0)

## 2023-12-14 NOTE — Discharge Instructions (Signed)
 Follow-up with your primary care doctor this week for further assessment of your symptoms.  Please make sure to keep your legs elevated.

## 2023-12-14 NOTE — ED Notes (Signed)
 Lab called for blood draw. Pt refusing hand draw

## 2023-12-14 NOTE — ED Notes (Signed)
 Lab unable to get blood and will send another person.

## 2023-12-14 NOTE — ED Triage Notes (Signed)
 First nurse note: Pt here via [facility] from home with c/o of weakness since yesterday. Pt endorses leg pain. Pt is A&Ox4. Pt recently had UTI.   153/85 99% RA HR: 75 CBG 244

## 2023-12-14 NOTE — ED Triage Notes (Addendum)
 Pt comes with [facility]/o [facility]. Pt was recently dx with UTI. Pt states some bilateral leg pain and swelling. Pt denies any cp or sob.

## 2023-12-14 NOTE — ED Provider Notes (Signed)
 [facility] Provider Note    Event Date/Time   First MD Initiated Contact with Patient 12/14/23 1129     (approximate)   History   Weakness and Emesis   HPI  Olivene Cookston is a 73 y.o. female with history of hypertension, bipolar disorder, depression, chronic back pain, presenting with bilateral lower extremity swelling.  Patient states that she does not know why she is here, states that her aide sent her in because of the lower extremity swelling.  She denies any new trauma or falls.  No chest pain or shortness of breath.  No new weakness or numbness, has bilateral back pain.  She denies any urinary symptoms.   Per EMS patient was complaining about generalized weakness since yesterday, also has bilateral leg pain.  Blood glucose was 244.  Physical Exam   Triage Vital Signs: ED Triage Vitals [12/14/23 1014]  Encounter Vitals Group     BP (!) 150/74     Systolic BP Percentile      Diastolic BP Percentile      Pulse Rate 75     Resp 18     Temp 98 F (36.7 C)     Temp src      SpO2 97 %     Weight 170 lb (77.1 kg)     Height 5\' 2"  (1.575 m)     Head Circumference      Peak Flow      Pain Score      Pain Loc      Pain Education      Exclude from Growth Chart     Most recent vital signs: Vitals:   12/14/23 1014 12/14/23 1404  BP: (!) 150/74 (!) 144/69  Pulse: 75 72  Resp: 18 18  Temp: 98 F (36.7 C) 98.2 F (36.8 C)  SpO2: 97% 97%     General: Awake, no distress.  CV:  Good peripheral perfusion.  Resp:  Normal effort.  No tachypnea or increased work of breathing Abd:  No distention.  Soft nontender Other:  She has bilateral CVA tenderness, bilateral lower extremity edema that appears symmetrical.  No focal weakness or numbness, no midline spinal tenderness, no saddle anesthesia   ED Results / Procedures / Treatments   Labs (all labs ordered are listed, but only abnormal results are displayed) Labs Reviewed  CBC - Abnormal;  Notable for the following components:      Result Value   Hemoglobin 11.7 (*)    RDW 17.2 (*)    All other components within normal limits  URINALYSIS, W/ REFLEX TO CULTURE (INFECTION SUSPECTED) - Abnormal; Notable for the following components:   Ketones, ur TRACE (*)    Protein, ur 30 (*)    Bacteria, UA RARE (*)    All other components within normal limits  BRAIN NATRIURETIC PEPTIDE - Abnormal; Notable for the following components:   B Natriuretic Peptide 135.0 (*)    All other components within normal limits  COMPREHENSIVE METABOLIC PANEL WITH GFR  CBG MONITORING, ED  TROPONIN I (HIGH SENSITIVITY)  TROPONIN I (HIGH SENSITIVITY)     EKG  EKG shows, EKG shows sinus rhythm, rate of 72, normal QS, normal QTc, no ischemia ST elevation, T wave version 2 3 V4, V5,, T wave flattening to 2, aVF, V6, not significantly compared to prior   RADIOLOGY On my independent interpretation, ultrasound without obvious DVT   PROCEDURES:  Critical Care performed: No  Procedures  MEDICATIONS ORDERED IN ED: Medications - No data to display   IMPRESSION / MDM / ASSESSMENT AND PLAN / ED COURSE  I reviewed the triage vital signs and the nursing notes.                              Differential diagnosis includes, but is not limited to, electrolyte derangements, occult infection, CHF, venous insufficiency, DVT, atypical ACS.  Will get labs, EKG, troponin, DVT ultrasound, UA, reassess.  Patient's presentation is most consistent with acute presentation with potential threat to life or bodily function.  Independent interpretation of labs and imaging below.  On reassessment patient is feeling a lot better, denies any chest pain or shortness of breath, he denies any weakness or numbness at this time.  No urinary symptoms.  Considered but no indication for inpatient admission at this time, she is safe for outpatient management.  Will discharge with strict precautions and instructions to follow-up  with primary care doctor this week for further management of her symptoms.  Discharged with strict return precautions.  The patient is on the cardiac monitor to evaluate for evidence of arrhythmia and/or significant heart rate changes.   Clinical Course as of 12/14/23 1542  Mon Dec 14, 2023  1449 US  Venous Img Lower Bilateral Negative for acute DVT bilaterally.  [TT]  1449 Independent review of labs, electrolytes are severely deranged, no leukocytosis, troponin is not elevated, BNP is mildly elevated. [TT]  1534 Urinalysis, w/ Reflex to Culture (Infection Suspected) -Urine, Clean Catch(!) Not consistent with UTI. [TT]    Clinical Course User Index [TT] Drenda Gentle, Richard Champion, MD     FINAL CLINICAL IMPRESSION(S) / ED DIAGNOSES   Final diagnoses:  Localized swelling of lower extremity  Generalized weakness     Rx / DC Orders   ED Discharge Orders     None        Note:  This document was prepared using Dragon voice recognition software and may include unintentional dictation errors.    Shane Darling, MD 12/14/23 [phone]

## 2023-12-14 NOTE — ED Notes (Signed)
 Attempted blood draw, and IV. Not successful x 2. Asked other dept members to assist.

## 2023-12-14 NOTE — ED Notes (Signed)
 Pt saturated her brief. Pt cleaned up and clean brief and gown placed. Pt placed on stretcher and given warm blankets. EDT Faith an Fabian Holster assisted pt. Pt belongings placed in bed.

## 2023-12-21 ENCOUNTER — Emergency Department

## 2023-12-21 ENCOUNTER — Encounter: Payer: Self-pay | Admitting: Emergency Medicine

## 2023-12-21 ENCOUNTER — Emergency Department
Admission: EM | Admit: 2023-12-21 | Discharge: 2023-12-21 | Disposition: A | Discharge status: Home / Self Care | Attending: Emergency Medicine | Admitting: Emergency Medicine

## 2023-12-21 ENCOUNTER — Other Ambulatory Visit: Payer: Self-pay

## 2023-12-21 DIAGNOSIS — W19XXXA Unspecified fall, initial encounter: Secondary | ICD-10-CM

## 2023-12-21 DIAGNOSIS — I6782 Cerebral ischemia: Secondary | ICD-10-CM | POA: Insufficient documentation

## 2023-12-21 DIAGNOSIS — S300XXA Contusion of lower back and pelvis, initial encounter: Secondary | ICD-10-CM | POA: Insufficient documentation

## 2023-12-21 DIAGNOSIS — S161XXA Strain of muscle, fascia and tendon at neck level, initial encounter: Secondary | ICD-10-CM | POA: Insufficient documentation

## 2023-12-21 DIAGNOSIS — Z8673 Personal history of transient ischemic attack (TIA), and cerebral infarction without residual deficits: Secondary | ICD-10-CM | POA: Diagnosis not present

## 2023-12-21 DIAGNOSIS — J45909 Unspecified asthma, uncomplicated: Secondary | ICD-10-CM | POA: Diagnosis not present

## 2023-12-21 DIAGNOSIS — M542 Cervicalgia: Secondary | ICD-10-CM | POA: Diagnosis present

## 2023-12-21 DIAGNOSIS — S0990XA Unspecified injury of head, initial encounter: Secondary | ICD-10-CM | POA: Diagnosis present

## 2023-12-21 DIAGNOSIS — M545 Low back pain, unspecified: Secondary | ICD-10-CM | POA: Diagnosis present

## 2023-12-21 MED ORDER — BACLOFEN 10 MG PO TABS: 10.0000 mg | ORAL_TABLET | Freq: Two times a day (BID) | ORAL | 0 refills | Status: AC

## 2023-12-21 MED ORDER — OXYCODONE-ACETAMINOPHEN 5-325 MG PO TABS
1.0000 | ORAL_TABLET | Freq: Once | ORAL | Status: AC
  Administered 2023-12-21: 1 via ORAL
  Filled 2023-12-21: qty 1

## 2023-12-21 MED ORDER — OXYCODONE-ACETAMINOPHEN 5-325 MG PO TABS: 1.0000 | ORAL_TABLET | ORAL | 0 refills | Status: AC | PRN
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
## 2024-01-14 NOTE — Progress Notes (Signed)
 CODING QUERY RESPONSE CT HEAD ORDERED DUE TO FALL AND HEAD INJURY

## 2024-01-28 ENCOUNTER — Other Ambulatory Visit: Payer: Self-pay | Admitting: Internal Medicine

## 2024-01-28 DIAGNOSIS — Z1231 Encounter for screening mammogram for malignant neoplasm of breast: Secondary | ICD-10-CM

## 2024-02-11 ENCOUNTER — Encounter

## 2024-03-30 ENCOUNTER — Encounter: Payer: Self-pay | Admitting: Ophthalmology

## 2024-03-30 NOTE — Anesthesia Preprocedure Evaluation (Addendum)
 Anesthesia Evaluation  Patient identified by MRN, date of birth, ID band Patient awake    Reviewed: Allergy & Precautions, NPO status , Patient's Chart, lab work & pertinent test results  History of Anesthesia Complications Negative for: history of anesthetic complications  Airway Mallampati: II   Neck ROM: Full    Dental  (+) Missing, Loose, Chipped   Pulmonary asthma , Current Smoker (2 cigarettes per day) and Patient abstained from smoking.   Pulmonary exam normal breath sounds clear to auscultation       Cardiovascular hypertension, Normal cardiovascular exam+ pacemaker (SSS)  Rhythm:Regular Rate:Normal     Neuro/Psych  PSYCHIATRIC DISORDERS  Depression    Vertigo; chronic pain    GI/Hepatic negative GI ROS,,,  Endo/Other  negative endocrine ROS    Renal/GU      Musculoskeletal  (+) Arthritis ,  Fibromyalgia -  Abdominal   Peds  Hematology negative hematology ROS (+)   Anesthesia Other Findings   Reproductive/Obstetrics                              Anesthesia Physical Anesthesia Plan  ASA: 2  Anesthesia Plan: MAC   Post-op Pain Management:    Induction: Intravenous  PONV Risk Score and Plan: 1 and Treatment may vary due to age or medical condition, Midazolam  and TIVA  Airway Management Planned: Natural Airway and Nasal Cannula  Additional Equipment:   Intra-op Plan:   Post-operative Plan:   Informed Consent: I have reviewed the patients History and Physical, chart, labs and discussed the procedure including the risks, benefits and alternatives for the proposed anesthesia with the patient or authorized representative who has indicated his/her understanding and acceptance.     Dental advisory given  Plan Discussed with: CRNA  Anesthesia Plan Comments: (LMA/GETA backup discussed.  Patient consented for risks of anesthesia including but not limited to:  - adverse  reactions to medications - damage to eyes, teeth, lips or other oral mucosa - nerve damage due to positioning  - sore throat or hoarseness - damage to heart, brain, nerves, lungs, other parts of body or loss of life  Informed patient about role of CRNA in peri- and intra-operative care.  Patient voiced understanding.)         Anesthesia Quick Evaluation

## 2024-04-06 NOTE — Discharge Instructions (Signed)

## 2024-04-07 ENCOUNTER — Encounter
Admission: RE | Disposition: A | Discharge status: Home / Self Care | Payer: Self-pay | Source: Home / Self Care | Attending: Ophthalmology

## 2024-04-07 ENCOUNTER — Ambulatory Visit: Payer: Self-pay | Admitting: Anesthesiology

## 2024-04-07 ENCOUNTER — Ambulatory Visit
Admission: RE | Admit: 2024-04-07 | Discharge: 2024-04-07 | Disposition: A | Discharge status: Home / Self Care | Attending: Ophthalmology | Admitting: Ophthalmology

## 2024-04-07 ENCOUNTER — Encounter: Payer: Self-pay | Admitting: Ophthalmology

## 2024-04-07 ENCOUNTER — Other Ambulatory Visit: Payer: Self-pay

## 2024-04-07 DIAGNOSIS — F32A Depression, unspecified: Secondary | ICD-10-CM | POA: Insufficient documentation

## 2024-04-07 DIAGNOSIS — G8929 Other chronic pain: Secondary | ICD-10-CM | POA: Insufficient documentation

## 2024-04-07 DIAGNOSIS — M797 Fibromyalgia: Secondary | ICD-10-CM | POA: Insufficient documentation

## 2024-04-07 DIAGNOSIS — I495 Sick sinus syndrome: Secondary | ICD-10-CM | POA: Insufficient documentation

## 2024-04-07 DIAGNOSIS — H2511 Age-related nuclear cataract, right eye: Secondary | ICD-10-CM | POA: Insufficient documentation

## 2024-04-07 DIAGNOSIS — Z950 Presence of cardiac pacemaker: Secondary | ICD-10-CM | POA: Insufficient documentation

## 2024-04-07 DIAGNOSIS — M1990 Unspecified osteoarthritis, unspecified site: Secondary | ICD-10-CM | POA: Insufficient documentation

## 2024-04-07 DIAGNOSIS — J45909 Unspecified asthma, uncomplicated: Secondary | ICD-10-CM | POA: Insufficient documentation

## 2024-04-07 DIAGNOSIS — I10 Essential (primary) hypertension: Secondary | ICD-10-CM | POA: Insufficient documentation

## 2024-04-07 DIAGNOSIS — R42 Dizziness and giddiness: Secondary | ICD-10-CM | POA: Diagnosis not present

## 2024-04-07 DIAGNOSIS — F17210 Nicotine dependence, cigarettes, uncomplicated: Secondary | ICD-10-CM | POA: Insufficient documentation

## 2024-04-07 HISTORY — PX: CATARACT EXTRACTION W/PHACO: SHX586

## 2024-04-07 HISTORY — DX: Other ill-defined heart diseases: I51.89

## 2024-04-07 SURGERY — PHACOEMULSIFICATION, CATARACT, WITH IOL INSERTION
Anesthesia: Monitor Anesthesia Care | Laterality: Right

## 2024-04-07 MED ORDER — BRIMONIDINE TARTRATE-TIMOLOL 0.2-0.5 % OP SOLN
OPHTHALMIC | Status: DC | PRN
  Administered 2024-04-07: 1 [drp] via OPHTHALMIC

## 2024-04-07 MED ORDER — TETRACAINE HCL 0.5 % OP SOLN
OPHTHALMIC | Status: AC
Start: 2024-04-07 — End: 2024-04-07
  Filled 2024-04-07: qty 4

## 2024-04-07 MED ORDER — MIDAZOLAM HCL 2 MG/2ML IJ SOLN
INTRAMUSCULAR | Status: AC
  Filled 2024-04-07: qty 2

## 2024-04-07 MED ORDER — LACTATED RINGERS IV SOLN: INTRAVENOUS | Status: DC

## 2024-04-07 MED ORDER — FENTANYL CITRATE (PF) 100 MCG/2ML IJ SOLN
INTRAMUSCULAR | Status: AC
  Filled 2024-04-07: qty 2

## 2024-04-07 MED ORDER — SIGHTPATH DOSE#1 NA HYALUR & NA CHOND-NA HYALUR IO KIT
PACK | INTRAOCULAR | Status: DC | PRN
  Administered 2024-04-07: 1 via OPHTHALMIC

## 2024-04-07 MED ORDER — SIGHTPATH DOSE#1 BSS IO SOLN
INTRAOCULAR | Status: DC | PRN
  Administered 2024-04-07: 101 mL via OPHTHALMIC

## 2024-04-07 MED ORDER — MIDAZOLAM HCL 2 MG/2ML IJ SOLN
INTRAMUSCULAR | Status: DC | PRN
  Administered 2024-04-07: 1 mg via INTRAVENOUS

## 2024-04-07 MED ORDER — FENTANYL CITRATE (PF) 100 MCG/2ML IJ SOLN
INTRAMUSCULAR | Status: DC | PRN
  Administered 2024-04-07: 50 ug via INTRAVENOUS

## 2024-04-07 MED ORDER — TETRACAINE HCL 0.5 % OP SOLN
1.0000 [drp] | OPHTHALMIC | Status: DC | PRN
  Administered 2024-04-07 (×3): 1 [drp] via OPHTHALMIC

## 2024-04-07 MED ORDER — LIDOCAINE HCL (PF) 2 % IJ SOLN
INTRAOCULAR | Status: DC | PRN
  Administered 2024-04-07: 1 mL via INTRAOCULAR

## 2024-04-07 MED ORDER — ARMC OPHTHALMIC DILATING DROPS
1.0000 | OPHTHALMIC | Status: DC | PRN
  Administered 2024-04-07 (×3): 1 via OPHTHALMIC

## 2024-04-07 MED ORDER — SIGHTPATH DOSE#1 BSS IO SOLN
INTRAOCULAR | Status: DC | PRN
  Administered 2024-04-07: 15 mL via INTRAOCULAR

## 2024-04-07 MED ORDER — ONDANSETRON HCL 4 MG/2ML IJ SOLN: 4.0000 mg | Freq: Once | INTRAMUSCULAR | Status: DC | PRN

## 2024-04-07 MED ORDER — MOXIFLOXACIN HCL 0.5 % OP SOLN
OPHTHALMIC | Status: DC | PRN
  Administered 2024-04-07: .2 mL via OPHTHALMIC

## 2024-04-07 MED ORDER — ARMC OPHTHALMIC DILATING DROPS
OPHTHALMIC | Status: AC
Start: 2024-04-07 — End: 2024-04-07
  Filled 2024-04-07: qty 0.5

## 2024-04-07 SURGICAL SUPPLY — 10 items
DISSECTOR HYDRO NUCLEUS 50X22 (MISCELLANEOUS) ×1 IMPLANT
DRSG TEGADERM 2-3/8X2-3/4 SM (GAUZE/BANDAGES/DRESSINGS) ×1 IMPLANT
FEE CATARACT SUITE SIGHTPATH (MISCELLANEOUS) ×1 IMPLANT
GLOVE BIOGEL PI IND STRL 8 (GLOVE) ×1 IMPLANT
GLOVE SURG LX STRL 7.5 STRW (GLOVE) ×1 IMPLANT
GLOVE SURG SYN 6.5 PF PI BL (GLOVE) ×1 IMPLANT
LENS IOL ENVISTA ASPR 29.0 (Intraocular Lens) IMPLANT
NDL FILTER BLUNT 18X1 1/2 (NEEDLE) ×1 IMPLANT
NEEDLE FILTER BLUNT 18X1 1/2 (NEEDLE) ×1 IMPLANT
SYR 3ML LL SCALE MARK (SYRINGE) ×1 IMPLANT

## 2024-04-07 NOTE — Op Note (Signed)
 OPERATIVE NOTE  Kari Hughes [id number] 04/07/2024   PREOPERATIVE DIAGNOSIS: Nuclear sclerotic cataract right eye. H25.11   POSTOPERATIVE DIAGNOSIS: Nuclear sclerotic cataract right eye. H25.11   PROCEDURE:  Phacoemulsification with posterior chamber intraocular lens placement of the right eye  Ultrasound time: Procedure(s): PHACOEMULSIFICATION, CATARACT, WITH IOL INSERTION 9.02, 00:58.9 (Right)  LENS:   Implant Name Type Inv. Item Serial No. Manufacturer Lot No. LRB No. Used Action  ENVISTA ASPIRE ACRYLIC IOL [id number]   [id number] [employer] [id number] Right 1 Implanted      SURGEON:  Feliciano HERO. Enola, MD   ANESTHESIA:  Topical with tetracaine  drops, augmented with 1% preservative-free intracameral lidocaine .   COMPLICATIONS:  None.   DESCRIPTION OF PROCEDURE:  The patient was identified in the holding room and transported to the operating room and placed in the supine position under the operating microscope.  The right eye was identified as the operative eye, which was prepped and draped in the usual sterile ophthalmic fashion.   A 1 millimeter clear-corneal paracentesis was made superotemporally. Preservative-free 1% lidocaine  mixed with 1:1,000 bisulfite-free aqueous solution of epinephrine  was injected into the anterior chamber. The anterior chamber was then filled with Viscoat viscoelastic. A 2.4 millimeter keratome was used to make a clear-corneal incision inferotemporally. A curvilinear capsulorrhexis was made with a cystotome and capsulorrhexis forceps. Balanced salt solution was used to hydrodissect and hydrodelineate the nucleus. Phacoemulsification was then used to remove the lens nucleus and epinucleus. The remaining cortex was then removed using the irrigation and aspiration handpiece. Provisc was then placed into the capsular bag to distend it for lens placement. A +29.00 D EA intraocular lens was then injected into the capsular bag. The remaining viscoelastic  was aspirated.   Wounds were hydrated with balanced salt solution.  The anterior chamber was inflated to a physiologic pressure with balanced salt solution.  No wound leaks were noted. Moxifloxacin  was injected intracamerally.  Timolol  and Brimonidine  drops were applied to the eye.  The patient was taken to the recovery room in stable condition without complications of anesthesia or surgery.  [employer] 04/07/2024, 11:04 AM

## 2024-04-07 NOTE — H&P (Signed)
 [facility]   Primary Care Physician:  Sherial Bail, MD Ophthalmologist: Dr. Feliciano Ober  Pre-Procedure History & Physical: HPI:  Kari Hughes is a 73 y.o. female here for cataract surgery.   Past Medical History:  Diagnosis Date   Asthma    Depression    Grade I diastolic dysfunction    Hypertension    Hypertension 2002   Spinal stenosis 2012   Vertigo     Past Surgical History:  Procedure Laterality Date   JOINT REPLACEMENT Left 2013   PACEMAKER PLACEMENT  2019    Prior to Admission medications   Medication Sig Start Date End Date Taking? Authorizing Provider  acetaminophen  (TYLENOL ) 325 MG tablet Take 2 tablets (650 mg total) by mouth every 6 (six) hours as needed for mild pain (pain score 1-3), fever or headache (or Fever >/= 101). 09/03/23   Alexander, Natalie, DO  acetaminophen  (TYLENOL ) 500 MG tablet Take 1,000 mg by mouth every 6 (six) hours as needed.    [provider]  albuterol  (VENTOLIN  HFA) 108 (90 Base) MCG/ACT inhaler Inhale 2 puffs into the lungs every 6 (six) hours as needed for shortness of breath or wheezing. 03/27/22 12/18/23  [provider]  amLODipine  (NORVASC ) 10 MG tablet Take 1 tablet by mouth daily. 03/31/23   [provider]  aspirin  EC 81 MG tablet Take 1 tablet (81 mg total) by mouth daily. Swallow whole. 04/12/23   Josette Ade, MD  atorvastatin  (LIPITOR) 40 MG tablet Take 1 tablet (40 mg total) by mouth daily. 04/12/23   Josette Ade, MD  cephALEXin  (KEFLEX ) 500 MG capsule Take 1 capsule (500 mg total) by mouth 2 (two) times daily. 11/02/23   Dorothyann Drivers, MD  Cholecalciferol  (VITAMIN D3) 50 MCG (2000 UT) CAPS Take 1 capsule (2,000 Units total) by mouth daily. 04/11/23   Josette Ade, MD  FLUoxetine  (PROZAC ) 20 MG capsule Take 20 mg by mouth every morning.    [provider]  gabapentin  (NEURONTIN ) 300 MG capsule Take 1 capsule (300 mg total) by mouth 3 (three) times daily. 09/03/23    Alexander, Natalie, DO  HYDROcodone -acetaminophen  (NORCO/VICODIN) 5-325 MG tablet Take 1 tablet by mouth every 6 (six) hours as needed for moderate pain (pain score 4-6) or severe pain (pain score 7-10). 09/03/23   Marsa Edelman, DO  lidocaine  (LIDODERM ) 5 % Place 1 patch onto the skin every 12 (twelve) hours. Remove & Discard patch within 12 hours or as directed by MD 06/17/23 06/16/24  Ward, Josette SAILOR, DO  loratadine  (CLARITIN ) 10 MG tablet Take 10 mg by mouth daily. 03/27/22   [provider]  losartan  (COZAAR ) 50 MG tablet Take 1 tablet by mouth daily. 09/05/22 09/05/23  [provider]  meclizine (ANTIVERT) 25 MG tablet Take 12.5-25 mg by mouth 3 (three) times daily as needed for dizziness.    [provider]  mirtazapine (REMERON) 7.5 MG tablet Take 7.5 mg by mouth at bedtime as needed. 08/06/23   [provider]  oxyCODONE -acetaminophen  (PERCOCET) 5-325 MG tablet Take 1 tablet by mouth every 4 (four) hours as needed for severe pain (pain score 7-10). 12/21/23 12/20/24  Gasper Devere ORN, PA-C    Allergies as of 03/21/2024   (No Known Allergies)    Family History  Problem Relation Age of Onset   Drug abuse Brother    Alcohol abuse Brother    Drug abuse Brother    Alcohol abuse Brother     Social History   Socioeconomic History  Marital status: Single    Spouse name: Not on file   Number of children: 3   Years of education: Not on file   Highest education level: Not on file  Occupational History   Not on file  Tobacco Use   Smoking status: Every Day    Types: Cigarettes, E-cigarettes   Smokeless tobacco: Never   Tobacco comments:    5 cigarettes per day  Vaping Use   Vaping status: Former  Substance and Sexual Activity   Alcohol use: Yes    Alcohol/week: 1.0 standard drink of alcohol    Types: 1 Glasses of wine per week    Comment: occasional   Drug use: Yes    Types: Marijuana   Sexual activity: Not Currently  Other Topics Concern    Not on file  Social History Narrative   Not on file   Social Drivers of Health   Financial Resource Strain: Low Risk  (03/24/2024)   Received from [facility]   Overall Financial Resource Strain (CARDIA)    Difficulty of Paying Living Expenses: Not very hard  Food Insecurity: No Food Insecurity (03/24/2024)   Received from [facility]   Hunger Vital Sign    Within the past 12 months, you worried that your food would run out before you got the money to buy more.: Never true    Within the past 12 months, the food you bought just didn't last and you didn't have money to get more.: Never true  Transportation Needs: No Transportation Needs (03/24/2024)   Received from [facility] - Transportation    In the past 12 months, has lack of transportation kept you from medical appointments or from getting medications?: No    Lack of Transportation (Non-Medical): No  Physical Activity: Not on file  Stress: Not on file  Social Connections: Moderately Isolated (09/01/2023)   Social Connection and Isolation Panel    Frequency of Communication with Friends and Family: Three times a week    Frequency of Social Gatherings with Friends and Family: Three times a week    Attends Religious Services: More than 4 times per year    Active Member of Clubs or Organizations: No    Attends Banker Meetings: Never    Marital Status: Separated  Intimate Partner Violence: Not At Risk (09/01/2023)   Humiliation, Afraid, Rape, and Kick questionnaire    Fear of Current or Ex-Partner: No    Emotionally Abused: No    Physically Abused: No    Sexually Abused: No    Review of Systems: See HPI, otherwise negative ROS  Physical Exam: Ht 5' 2 (1.575 m)   Wt 75.3 kg   BMI 30.36 kg/m  General:   Alert, cooperative in NAD Head:  Normocephalic and atraumatic. Respiratory:  Normal work of breathing. Cardiovascular:   RRR  Impression/Plan: Kari Hughes is here for cataract surgery.  Risks, benefits, limitations, and alternatives regarding cataract surgery have been reviewed with the patient.  Questions have been answered.  All parties agreeable.   Feliciano Bryan Ober, MD  04/07/2024, 7:17 AM

## 2024-04-07 NOTE — Transfer of Care (Signed)
 Immediate Anesthesia Transfer of Care Note  Patient: Kari Hughes  Procedure(s) Performed: PHACOEMULSIFICATION, CATARACT, WITH IOL INSERTION 9.02, 00:58.9 (Right)  Patient Location: PACU  Anesthesia Type: MAC  Level of Consciousness: awake, alert  and patient cooperative  Airway and Oxygen Therapy: Patient Spontanous Breathing and Patient connected to supplemental oxygen  Post-op Assessment: Post-op Vital signs reviewed, Patient's Cardiovascular Status Stable, Respiratory Function Stable, Patent Airway and No signs of Nausea or vomiting  Post-op Vital Signs: Reviewed and stable  Complications: No notable events documented.

## 2024-04-07 NOTE — Anesthesia Postprocedure Evaluation (Signed)
 Anesthesia Post Note  Patient: Kari Hughes  Procedure(s) Performed: PHACOEMULSIFICATION, CATARACT, WITH IOL INSERTION 9.02, 00:58.9 (Right)  Patient location during evaluation: PACU Anesthesia Type: MAC Level of consciousness: awake and alert, oriented and patient cooperative Pain management: pain level controlled Vital Signs Assessment: post-procedure vital signs reviewed and stable Respiratory status: spontaneous breathing, nonlabored ventilation and respiratory function stable Cardiovascular status: blood pressure returned to baseline and stable Postop Assessment: adequate PO intake Anesthetic complications: no   No notable events documented.   Last Vitals:  Vitals:   04/07/24 1106 04/07/24 1110  BP: (!) 112/96 115/86  Pulse: 80 78  Resp: 14   Temp: 36.8 C   SpO2: 96% 98%    Last Pain:  Vitals:   04/07/24 1110  TempSrc:   PainSc: 0-No pain                 Alfonso Ruths

## 2024-04-08 ENCOUNTER — Encounter: Payer: Self-pay | Admitting: Ophthalmology

## 2024-04-14 ENCOUNTER — Encounter: Payer: Self-pay | Admitting: Ophthalmology

## 2024-04-15 NOTE — Anesthesia Preprocedure Evaluation (Addendum)
 Anesthesia Evaluation    Airway Mallampati: II   Neck ROM: Full    Dental  (+) Chipped, Loose, Missing  Missing, Loose, Chipped  :   Pulmonary shortness of breath, asthma , Current Smoker          Cardiovascular hypertension, + pacemaker   04-11-23 echo 1. Left ventricular ejection fraction, by estimation, is 60 to 65%. The  left ventricle has normal function. The left ventricle has no regional  wall motion abnormalities. There is moderate left ventricular hypertrophy  of the basal-septal segment. Left  ventricular diastolic parameters are consistent with Grade I diastolic  dysfunction (impaired relaxation).   2. Right ventricular systolic function is normal. The right ventricular  size is normal.   3. The mitral valve is normal in structure. Trivial mitral valve  regurgitation. No evidence of mitral stenosis.   4. The aortic valve is normal in structure. Aortic valve regurgitation is  not visualized. No aortic stenosis is present.   5. The inferior vena cava is normal in size with greater than 50%  respiratory variability, suggesting right atrial pressure of 3 mmHg.   Conclusion(s)/Recommendation(s): No intracardiac source of embolism  detected on this transthoracic study. Consider a transesophageal  echocardiogram to exclude cardiac source of embolism if clinically  indicated.     Neuro/Psych  PSYCHIATRIC DISORDERS Anxiety Depression Bipolar Disorder    Neuromuscular disease CVA    GI/Hepatic   Endo/Other    Renal/GU Renal disease     Musculoskeletal  (+) Arthritis ,  Fibromyalgia -  Abdominal   Peds  Hematology   Anesthesia Other Findings Previous cataract 04-07-24 Dr. Mazzoni  Hypertension  Asthma Depression  Hypertension Vertigo  Spinal stenosis Grade I diastolic dysfunction     Reproductive/Obstetrics                              Anesthesia Physical Anesthesia  Plan  ASA: 2  Anesthesia Plan: MAC   Post-op Pain Management:    Induction: Intravenous  PONV Risk Score and Plan:   Airway Management Planned: Natural Airway and Nasal Cannula  Additional Equipment:   Intra-op Plan:   Post-operative Plan:   Informed Consent: I have reviewed the patients History and Physical, chart, labs and discussed the procedure including the risks, benefits and alternatives for the proposed anesthesia with the patient or authorized representative who has indicated his/her understanding and acceptance.     Dental Advisory Given  Plan Discussed with: Anesthesiologist, CRNA and Surgeon  Anesthesia Plan Comments: (Patient consented for risks of anesthesia including but not limited to:  - adverse reactions to medications - damage to eyes, teeth, lips or other oral mucosa - nerve damage due to positioning  - sore throat or hoarseness - Damage to heart, brain, nerves, lungs, other parts of body or loss of life  Patient voiced understanding and assent.)         Anesthesia Quick Evaluation

## 2024-04-21 ENCOUNTER — Encounter: Payer: Self-pay | Admitting: Anesthesiology

## 2024-04-21 ENCOUNTER — Ambulatory Visit: Admission: RE | Admit: 2024-04-21 | Source: Home / Self Care | Admitting: Ophthalmology

## 2024-04-21 SURGERY — PHACOEMULSIFICATION, CATARACT, WITH IOL INSERTION
Anesthesia: Topical | Laterality: Left

## 2024-05-12 ENCOUNTER — Encounter: Payer: Self-pay | Admitting: Cardiology

## 2024-07-05 ENCOUNTER — Telehealth: Payer: Self-pay | Admitting: Cardiology

## 2024-07-05 NOTE — Telephone Encounter (Signed)
 Patient dropped C-SNP form to be signed placed in S. Riddle box.

## 2024-07-11 ENCOUNTER — Telehealth: Payer: Self-pay

## 2024-07-11 NOTE — Telephone Encounter (Signed)
 Attempted to call Kari Hughes at the phone number she listed on the paperwork submitted to our office for [facility]. Voicemail box is full and unable to leave a message. Paperwork is ready to be picked up. Scheduling team asked to schedule follow up appointment since patient has not been seen in approximately 18 months. Patient does not use My Chart.     I spoke with Kari Hughes, Patient's Sister. Paperwork faxed to [facility] [employer] at [phone]. Copy sent to be imaged for patient chart along with fax confirmation sheet. Originals left at Check In Desk for [name] to pick up.

## 2024-07-24 NOTE — Progress Notes (Unsigned)
 "     Electrophysiology Clinic Note    Date:  07/25/2024  Patient ID:  Kari Hughes, Kari Hughes 04/20/1951, MRN [id number] PCP:  Sherial Bail, MD  Cardiologist:  None  Electrophysiologist:  Fonda Kitty, MD     Discussed the use of AI scribe software for clinical note transcription with the patient, who gave verbal consent to proceed.   Patient Profile    Chief Complaint: PPM follow-up  History of Present Illness: Kari Hughes is a 74 y.o. female with PMH notable for SND s/p PPM, HTN, HLD, CVA, fibromyalgia, chronic pain; seen today for Fonda Kitty, MD (Previously Dr. Cindie) for routine electrophysiology followup.  She was last seen 08/2022 by Dr. Cindie for routine PPM follow-up.  She is not enrolled in remote monitoring.  On follow-up today, she has no complaints related to her pacemaker.  She notes that over the past couple months she has had 3 separate syncopal episodes most recently this past Sunday.  Each episode involved loss of consciousness with full recovery without any residual symptoms.  She saw primary care earlier today for these episodes of syncope and had a scan of her brain earlier today.  She was not aware that she is not sending remote checks to office. She has bedside monitor that is plugged in.   She denies chest pain, chest pressure, palpitations, increased lower extremity edema.      Arrhythmia/Device History Biotronik dual chamber PPM, imp 2016; dx SND    ROS:  Please see the history of present illness. All other systems are reviewed and otherwise negative.    Physical Exam    VS:  BP 132/78 (BP Location: Left Arm, Patient Position: Sitting, Cuff Size: Normal)   Pulse 67   Ht 5' 1 (1.549 m)   Wt 172 lb (78 kg)   SpO2 99%   BMI 32.50 kg/m  BMI: Body mass index is 32.5 kg/m.           Wt Readings from Last 3 Encounters:  07/25/24 172 lb (78 kg)  04/07/24 166 lb (75.3 kg)  12/21/23 160 lb (72.6 kg)      GEN- The patient is  well appearing, alert and oriented x 3 today.   Lungs- Clear to ausculation bilaterally, normal work of breathing.  Heart- Regular rate and rhythm, no murmurs, rubs or gallops Extremities- No peripheral edema, warm, dry Skin-  device pocket well-healed, no tethering   Device interrogation done today and reviewed by myself:  Battery 3 years Lead thresholds, impedence, sensing stable  Low VP One brief SVT episodes lasting 6 seconds in 12/2023 No changes made today   Studies Reviewed   Previous EP, cardiology notes.    EKG is ordered. Personal review of EKG from today shows:    EKG Interpretation Date/Time:  Monday July 25 2024 11:09:04 EST Ventricular Rate:  67 PR Interval:  172 QRS Duration:  84 QT Interval:  392 QTC Calculation: 414 R Axis:   -16  Text Interpretation: Atrial-paced rhythm Nonspecific T wave abnormality Confirmed by Hamza Empson [phone]) on 07/25/2024 11:15:18 AM    TTE, 04/11/2023  1. Left ventricular ejection fraction, by estimation, is 60 to 65%. The left ventricle has normal function. The left ventricle has no regional wall motion abnormalities. There is moderate left ventricular hypertrophy of the basal-septal segment. Left ventricular diastolic parameters are consistent with Grade I diastolic dysfunction (impaired relaxation).   2. Right ventricular systolic function is normal. The right ventricular size is normal.  3. The mitral valve is normal in structure. Trivial mitral valve regurgitation. No evidence of mitral stenosis.   4. The aortic valve is normal in structure. Aortic valve regurgitation is not visualized. No aortic stenosis is present.   5. The inferior vena cava is normal in size with greater than 50% respiratory variability, suggesting right atrial pressure of 3 mmHg.   Conclusion(s)/Recommendation(s): No intracardiac source of embolism detected on this transthoracic study. Consider a transesophageal echocardiogram to exclude cardiac source of  embolism if clinically indicated.   Assessment and Plan     #) SND s/p PPM Device functioning well, see paceart for details Battery good Lead measurements stable No concerning arrhythmias noted during recent syncopal episodes She is interested in remote monitoring, encouraged her to plug in bedside monitor and leave plugged in. Will send msg to device clinic to set up remote monitoring  #) syncope No concerning arrhythmias on PPM Continue to follow-up with PCP for ongoing workup and evaluation       Current medicines are reviewed at length with the patient today.   The patient does not have concerns regarding her medicines.  The following changes were made today:  none  Labs/ tests ordered today include:  Orders Placed This Encounter  Procedures   EKG 12-Lead     Disposition: Follow up with Dr. Kennyth or EP APP in 12 months if unable to establish remote monitoring.   Follow-up in 2 years if able to set up remote monitoring   Signed, Trenae Brunke, NP  07/25/2024  11:55 AM  Electrophysiology [facility] "

## 2024-07-25 ENCOUNTER — Ambulatory Visit
Admission: RE | Admit: 2024-07-25 | Discharge: 2024-07-25 | Disposition: A | Discharge status: Home / Self Care | Source: Ambulatory Visit | Attending: Internal Medicine | Admitting: Internal Medicine

## 2024-07-25 ENCOUNTER — Other Ambulatory Visit: Payer: Self-pay | Admitting: Internal Medicine

## 2024-07-25 ENCOUNTER — Ambulatory Visit: Attending: Cardiology | Admitting: Cardiology

## 2024-07-25 ENCOUNTER — Encounter: Payer: Self-pay | Admitting: Cardiology

## 2024-07-25 VITALS — BP 132/78 | HR 67 | Ht 61.0 in | Wt 172.0 lb

## 2024-07-25 DIAGNOSIS — Z950 Presence of cardiac pacemaker: Secondary | ICD-10-CM

## 2024-07-25 DIAGNOSIS — R55 Syncope and collapse: Secondary | ICD-10-CM | POA: Insufficient documentation

## 2024-07-25 DIAGNOSIS — I495 Sick sinus syndrome: Secondary | ICD-10-CM

## 2024-07-25 LAB — CUP PACEART INCLINIC DEVICE CHECK
Battery Remaining Percentage: 35 %
Brady Statistic RA Percent Paced: 82 %
Brady Statistic RV Percent Paced: 0 %
Date Time Interrogation Session: 20260112111721
Implantable Lead Connection Status: 753985
Implantable Lead Connection Status: 753985
Implantable Lead Implant Date: 20160114
Implantable Lead Implant Date: 20160114
Implantable Lead Location: 753859
Implantable Lead Location: 753860
Implantable Lead Model: 350
Implantable Lead Model: 350
Implantable Lead Serial Number: 29732119
Implantable Lead Serial Number: 29737369
Implantable Pulse Generator Implant Date: 20160114
Lead Channel Impedance Value: 585 Ohm
Lead Channel Impedance Value: 936 Ohm
Lead Channel Pacing Threshold Amplitude: 0.6 V
Lead Channel Pacing Threshold Amplitude: 0.8 V
Lead Channel Pacing Threshold Pulse Width: 0.4 ms
Lead Channel Pacing Threshold Pulse Width: 0.4 ms
Lead Channel Setting Pacing Amplitude: 1.2 V
Lead Channel Setting Pacing Amplitude: 1.6 V
Lead Channel Setting Pacing Pulse Width: 0.4 ms
Pulse Gen Model: 394969
Pulse Gen Serial Number: 68455363

## 2024-07-25 NOTE — Patient Instructions (Signed)
 Medication Instructions:  Your physician recommends that you continue on your current medications as directed. Please refer to the Current Medication list given to you today.  *If you need a refill on your cardiac medications before your next appointment, please call your pharmacy*  Lab Work: No labs ordered today    Testing/Procedures: No test ordered today   Follow-Up:  Device Clinic will reach out to you in regards to setting up the Remote Monitoring.   At [facility], you and your health needs are our priority.  As part of our continuing mission to provide you with exceptional heart care, our providers are all part of one team.  This team includes your primary Cardiologist (physician) and Advanced Practice Providers or APPs (Physician Assistants and Nurse Practitioners) who all work together to provide you with the care you need, when you need it.  Your next appointment:   1 year(s) without Remote Monitoring  2 years with Remote Monitoring.  Provider:   Suzann Riddle, NP or Dr. Fonda Kitty

## 2024-07-26 ENCOUNTER — Telehealth: Payer: Self-pay

## 2024-07-26 NOTE — Telephone Encounter (Signed)
 Pt needs another appt to come in and have her monitored re-paired to her device since its been a while since she did remote monitoring. Pt sees suzann in [place], are you able to schedule this for her? I can have a rep come out there to help re-pair if suzann can't do it

## 2024-07-26 NOTE — Telephone Encounter (Signed)
 Spoke with Shand(biotronik rep) and he will get pt in the site so that we can set her up for remote following

## 2024-07-27 NOTE — Telephone Encounter (Signed)
 No appt necessary if you can just call her and ask her a good time for her to come that works best for you all and let me know ill let the rep know

## 2024-07-29 NOTE — Telephone Encounter (Signed)
 Suzann Riddle, NP reached out to [place], [employer], to coordinate a time (not an appointment) but a time/place to meet patient to set up remote monitoring.   I contacted patient for Terrea to attempt to set up a time in our [place] office or at [place] office to meet Hunter.  He will need to meet her at one of our clinic sites.    LM for patient to call back and arrange a time and location.  Please reach out to [facility] to confirm time and office meeting location and he will assist.

## 2024-08-01 NOTE — Telephone Encounter (Addendum)
 Spoke w/ patient regarding the need to coordinate a time/place to meet a Bio Representative to set up remote monitoring. Patient sister states they can meet at the clinic in [place] at 11:00am on Thursday, 08/04/2024. States they will bring their monitor with them.   Bio Rep aware of place/time to meet patient and establish remote monitoring.   Will continue to monitor and update accordingly.

## 2024-08-04 NOTE — Telephone Encounter (Signed)
 [employer] rep Ami met patient this am and assisted with remote monitor set up.   [employer] has been notified and they are setting up patient's 91 day send schedule for routine monitoring.  First activation transmission received in website today.

## 2024-09-15 ENCOUNTER — Ambulatory Visit

## 2024-12-15 ENCOUNTER — Ambulatory Visit

## 2025-03-16 ENCOUNTER — Ambulatory Visit

## 2025-06-15 ENCOUNTER — Ambulatory Visit

## 2025-09-14 ENCOUNTER — Ambulatory Visit
